# Patient Record
Sex: Male | Born: 1965
Health system: Southern US, Community
[De-identification: ages and names within clinical notes are randomized; demographics above are authoritative.]

## PROBLEM LIST (undated history)

## (undated) DIAGNOSIS — E119 Type 2 diabetes mellitus without complications: Secondary | ICD-10-CM

## (undated) DIAGNOSIS — Z720 Tobacco use: Secondary | ICD-10-CM

## (undated) DIAGNOSIS — F209 Schizophrenia, unspecified: Secondary | ICD-10-CM

## (undated) DIAGNOSIS — F1910 Other psychoactive substance abuse, uncomplicated: Secondary | ICD-10-CM

## (undated) HISTORY — PX: SKIN GRAFT: SHX250

---

## 2017-09-20 DIAGNOSIS — I10 Essential (primary) hypertension: Secondary | ICD-10-CM

## 2017-09-20 DIAGNOSIS — Z8639 Personal history of other endocrine, nutritional and metabolic disease: Secondary | ICD-10-CM

## 2017-09-20 DIAGNOSIS — F1911 Other psychoactive substance abuse, in remission: Secondary | ICD-10-CM | POA: Insufficient documentation

## 2017-09-20 HISTORY — DX: Personal history of other endocrine, nutritional and metabolic disease: Z86.39

## 2017-09-20 HISTORY — DX: Essential (primary) hypertension: I10

## 2017-10-05 DIAGNOSIS — R7989 Other specified abnormal findings of blood chemistry: Secondary | ICD-10-CM | POA: Insufficient documentation

## 2017-10-05 DIAGNOSIS — R768 Other specified abnormal immunological findings in serum: Secondary | ICD-10-CM | POA: Insufficient documentation

## 2017-10-05 HISTORY — DX: Other specified abnormal immunological findings in serum: R76.8

## 2017-10-11 DIAGNOSIS — K056 Periodontal disease, unspecified: Secondary | ICD-10-CM

## 2017-10-11 HISTORY — DX: Periodontal disease, unspecified: K05.6

## 2018-02-24 DIAGNOSIS — F1120 Opioid dependence, uncomplicated: Secondary | ICD-10-CM

## 2018-02-24 HISTORY — DX: Opioid dependence, uncomplicated: F11.20

## 2019-05-15 ENCOUNTER — Emergency Department (HOSPITAL_BASED_OUTPATIENT_CLINIC_OR_DEPARTMENT_OTHER)
Admission: EM | Admit: 2019-05-15 | Discharge: 2019-05-16 | Disposition: A | Discharge status: Home / Self Care | Payer: Self-pay | Attending: Emergency Medicine | Admitting: Emergency Medicine

## 2019-05-15 ENCOUNTER — Other Ambulatory Visit: Payer: Self-pay

## 2019-05-15 ENCOUNTER — Encounter (HOSPITAL_BASED_OUTPATIENT_CLINIC_OR_DEPARTMENT_OTHER): Payer: Self-pay

## 2019-05-15 ENCOUNTER — Emergency Department (HOSPITAL_BASED_OUTPATIENT_CLINIC_OR_DEPARTMENT_OTHER): Payer: Self-pay

## 2019-05-15 DIAGNOSIS — T148XXA Other injury of unspecified body region, initial encounter: Secondary | ICD-10-CM

## 2019-05-15 DIAGNOSIS — F1210 Cannabis abuse, uncomplicated: Secondary | ICD-10-CM | POA: Insufficient documentation

## 2019-05-15 DIAGNOSIS — Y929 Unspecified place or not applicable: Secondary | ICD-10-CM | POA: Insufficient documentation

## 2019-05-15 DIAGNOSIS — Y998 Other external cause status: Secondary | ICD-10-CM | POA: Insufficient documentation

## 2019-05-15 DIAGNOSIS — R410 Disorientation, unspecified: Secondary | ICD-10-CM | POA: Insufficient documentation

## 2019-05-15 DIAGNOSIS — F1510 Other stimulant abuse, uncomplicated: Secondary | ICD-10-CM | POA: Insufficient documentation

## 2019-05-15 DIAGNOSIS — R462 Strange and inexplicable behavior: Secondary | ICD-10-CM | POA: Insufficient documentation

## 2019-05-15 DIAGNOSIS — R5383 Other fatigue: Secondary | ICD-10-CM | POA: Insufficient documentation

## 2019-05-15 DIAGNOSIS — S90821A Blister (nonthermal), right foot, initial encounter: Secondary | ICD-10-CM | POA: Insufficient documentation

## 2019-05-15 DIAGNOSIS — E119 Type 2 diabetes mellitus without complications: Secondary | ICD-10-CM | POA: Insufficient documentation

## 2019-05-15 DIAGNOSIS — Y939 Activity, unspecified: Secondary | ICD-10-CM | POA: Insufficient documentation

## 2019-05-15 DIAGNOSIS — X58XXXA Exposure to other specified factors, initial encounter: Secondary | ICD-10-CM | POA: Insufficient documentation

## 2019-05-15 DIAGNOSIS — F17210 Nicotine dependence, cigarettes, uncomplicated: Secondary | ICD-10-CM | POA: Insufficient documentation

## 2019-05-15 DIAGNOSIS — F1910 Other psychoactive substance abuse, uncomplicated: Secondary | ICD-10-CM

## 2019-05-15 HISTORY — DX: Type 2 diabetes mellitus without complications: E11.9

## 2019-05-15 LAB — CBC WITH DIFFERENTIAL/PLATELET
Abs Immature Granulocytes: 0.05 10*3/uL (ref 0.00–0.07)
Basophils Absolute: 0.1 10*3/uL (ref 0.0–0.1)
Basophils Relative: 0 %
Eosinophils Absolute: 0.2 10*3/uL (ref 0.0–0.5)
Eosinophils Relative: 2 %
HCT: 41.5 % (ref 39.0–52.0)
Hemoglobin: 14.5 g/dL (ref 13.0–17.0)
Immature Granulocytes: 0 %
Lymphocytes Relative: 14 %
Lymphs Abs: 1.8 10*3/uL (ref 0.7–4.0)
MCH: 29.4 pg (ref 26.0–34.0)
MCHC: 34.9 g/dL (ref 30.0–36.0)
MCV: 84.2 fL (ref 80.0–100.0)
Monocytes Absolute: 0.7 10*3/uL (ref 0.1–1.0)
Monocytes Relative: 5 %
Neutro Abs: 10.3 10*3/uL — ABNORMAL HIGH (ref 1.7–7.7)
Neutrophils Relative %: 79 %
Platelets: 361 10*3/uL (ref 150–400)
RBC: 4.93 MIL/uL (ref 4.22–5.81)
RDW: 12.9 % (ref 11.5–15.5)
WBC: 13.2 10*3/uL — ABNORMAL HIGH (ref 4.0–10.5)
nRBC: 0 % (ref 0.0–0.2)

## 2019-05-15 LAB — URINALYSIS, ROUTINE W REFLEX MICROSCOPIC
Bilirubin Urine: NEGATIVE
Glucose, UA: NEGATIVE mg/dL
Hgb urine dipstick: NEGATIVE
Ketones, ur: NEGATIVE mg/dL
Leukocytes,Ua: NEGATIVE
Nitrite: NEGATIVE
Protein, ur: NEGATIVE mg/dL
Specific Gravity, Urine: >1.03 — ABNORMAL HIGH (ref 1.005–1.030)
pH: 6 (ref 5.0–8.0)

## 2019-05-15 LAB — COMPREHENSIVE METABOLIC PANEL
ALT: 27 U/L (ref 0–44)
AST: 24 U/L (ref 15–41)
Albumin: 4.5 g/dL (ref 3.5–5.0)
Alkaline Phosphatase: 124 U/L (ref 38–126)
Anion gap: 11 (ref 5–15)
BUN: 14 mg/dL (ref 6–20)
CO2: 27 mmol/L (ref 22–32)
Calcium: 9.4 mg/dL (ref 8.9–10.3)
Chloride: 96 mmol/L — ABNORMAL LOW (ref 98–111)
Creatinine, Ser: 0.82 mg/dL (ref 0.61–1.24)
GFR calc Af Amer: >60 mL/min (ref >60)
GFR calc non Af Amer: >60 mL/min (ref >60)
Glucose, Bld: 186 mg/dL — ABNORMAL HIGH (ref 70–99)
Potassium: 3.8 mmol/L (ref 3.5–5.1)
Sodium: 134 mmol/L — ABNORMAL LOW (ref 135–145)
Total Bilirubin: 0.6 mg/dL (ref 0.3–1.2)
Total Protein: 8.1 g/dL (ref 6.5–8.1)

## 2019-05-15 LAB — ETHANOL: Alcohol, Ethyl (B): <10 mg/dL (ref <10)

## 2019-05-15 LAB — RAPID URINE DRUG SCREEN, HOSP PERFORMED
Amphetamines: POSITIVE — AB
Barbiturates: NOT DETECTED
Benzodiazepines: NOT DETECTED
Cocaine: NOT DETECTED
Opiates: NOT DETECTED
Tetrahydrocannabinol: POSITIVE — AB

## 2019-05-15 LAB — CBG MONITORING, ED
Glucose-Capillary: 267 mg/dL — ABNORMAL HIGH (ref 70–99)
Glucose-Capillary: 190 mg/dL — ABNORMAL HIGH (ref 70–99)

## 2019-05-15 LAB — TSH: TSH: 1.057 u[IU]/mL (ref 0.350–4.500)

## 2019-05-15 LAB — AMMONIA: Ammonia: 28 umol/L (ref 9–35)

## 2019-05-15 LAB — ACETAMINOPHEN LEVEL: Acetaminophen (Tylenol), Serum: <10 ug/mL — ABNORMAL LOW (ref 10–30)

## 2019-05-15 LAB — SALICYLATE LEVEL: Salicylate Lvl: <7 mg/dL (ref 2.8–30.0)

## 2019-05-15 MED ORDER — BACITRACIN ZINC 500 UNIT/GM EX OINT
TOPICAL_OINTMENT | Freq: Two times a day (BID) | CUTANEOUS | Status: DC
  Administered 2019-05-15: 1 via TOPICAL
  Filled 2019-05-15: qty 28.35

## 2019-05-15 NOTE — ED Notes (Signed)
Attempted to call emergency contact in chart.  Number is not working.

## 2019-05-15 NOTE — ED Notes (Signed)
Discussed pt LOC with EDP; orders received.

## 2019-05-15 NOTE — ED Notes (Signed)
PT KEYS LABELED AND GIVEN TO SECURITY.

## 2019-05-15 NOTE — ED Notes (Addendum)
No change in LOC; pt arouses briefly to tactile stimuli; sitting up in bed.

## 2019-05-15 NOTE — ED Notes (Signed)
Pt appears to be intoxicated and falls asleep repeatedly mid-sentence while being assessed

## 2019-05-15 NOTE — Discharge Instructions (Addendum)
Substance Abuse Treatment Programs ° °Intensive Outpatient Programs °[facility]     °[address]      °[place], [place]                   °[phone]      ° °[facility] °[address] °[place], [place] °[phone] ° °[facility]     °(Inpatient and outpatient)     °[address]           °[phone]   ° °[facility] °[phone] (Suboxone and Methadone) ° °[address]      °[place], [place] [address]      °[phone]      ° °[address] °[place], [place] °[phone] ° °[facility] (Outpatient/Inpatient, Chemical)    °(insurance only) [phone]      °       °[facility] (Groups & Residential) °[place], [place] °[phone] ° °   °[facility]     °[address]     °[place], [place]      °[phone]      ° °[facility] (for caregivers and family) °[address] °[place], [place] °336-299-4655 ° ° ° ° ° °Residential Treatment Programs °[facility]      °[address], [place], [place] [address]  °[phone]      ° °[facility] °[address] [place], [place] [address] °[phone] ° °[facility]        °[phone]      ° °[facility] °[phone] ° °[facility]             °[address]                                         °[place], [place]                                                °[phone] or [phone]                              ° °[facility] [place] °[address] °[place], [address] °[phone] ° °[facility]    °[address]      °, [place]     °[phone]      ° °[facility] °[address] °, [place] °[phone] ° °[facility]   °[address]     °[place], [place] [address]     °[phone]      °Admissions: 8am-3pm M-F ° °[facility] °[address] °[place],  [place] °[phone] ° °[facility]: Residential Program (90 Days)   °[place], [place]      °[phone] or [phone]    ° °[facility] °[place], [place] °(Walk in Hours over the weekend or by referral) ° °[facility] °[address], [place], [place] [address] °([phone] ° °Crisis Mobile: [facility]:  1-877-626-1772 (for crisis response 24 hours a day) °[facility] Hotline:      1-800-256-2452 °Outpatient Psychiatry and Counseling ° °[facility]: Mobile Crisis   Management 24 hours:  1-579-867-5796  [facility] of the [employer] sliding scale fee and walk in schedule: M-F 8am-12pm/1pm-3pm [address]  [place], [place] [address] [place], [place] [address] [phone]  [facility] ([facility])- new patient walk-in appointments available Monday - Friday 8am -3pm.          [address] [place], [place] [address] [phone] or crisis line- [facility]/ Intensive Outpatient Therapy Program [place], [place] [address] [place]      [phone] [place], [place] [address]                 [place]   [facility] [phone]. [place], [place] [address]   [employer]          [address] Tanner [name]  [place], [place] [address]       [phone]  [facility] [address] [place], [place] [address] [phone]  [facility]    [address] [place], [place] [address]     [place], [place] Tanner [name]     [place] [address]     [phone]       [facility] [place] [address]  [facility]     [address], [place], [place], [place] [address] [place]     [address]     [place], [place] [address]     [phone]       [facility] [address] [place], [place] [address] [phone] Resources for Temporary Residential Assistance/Crisis [facility]) M-F 8am-3pm   [address] [place], [place] [address]   [phone] Services include: laundry, barbering, support groups, case management, phone  & computer access, showers, AA/NA mtgs, mental health/substance abuse nurse, job skills class, disability information, VA assistance, spiritual classes, etc.   [facility]   [address], [place]     [place]              [employer] (women and children)       [place]. [place], [place] [address] [phone] [email or web address] for application and process Application Required  [facility]   [address]    [place] [address]     [phone]                    [place],  [address] [phone](OILNZVJK application appt.) Application Required  [facility] (women only)    [address]     [place],  [address]     [phone]  Intake starts 6pm daily Need valid ID, SSC, & Police report [employer] [address] [place], [place] [phone] Application Required  [employer] (men only)     [place].      [place], [place]       [place] (Pregnant women only) [address] [place], [place]  [facility]      [place] Tanner [name].      [place], [place] [address]     [phone]             [facility] [address] [place], [place] 90 day commitment/SA/Application process  [facility](men only)     [address]     [place], [place]       Check-in at [facility] [address] [place], [place] [address] [phone] Men/Women/Women and Children must be there by 7 pm  [place], [employer]                Schedule an appointment as soon as possible with the wound clinic.  If you have any signs of infection such as increased swelling, drainage, redness then please return to the emergency department.

## 2019-05-15 NOTE — ED Notes (Signed)
Pt provided with TV dinner.

## 2019-05-15 NOTE — ED Notes (Signed)
Pt awake and talking to TTS

## 2019-05-15 NOTE — ED Notes (Signed)
Pt arousable to tactile stimuli (gentle shoulder nudge); opens eyes briefly, then falls back asleep.

## 2019-05-15 NOTE — ED Notes (Signed)
Pt moved to hallway bed so that he can be observed until he is more alert.

## 2019-05-15 NOTE — ED Notes (Signed)
Pt states "I'm just here for something for pain" to EMT when CBG done
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
## 2019-05-15 NOTE — ED Notes (Signed)
Pt placed in gown only; belongings bagged, labeled and placed at nurses' station.

## 2019-05-15 NOTE — ED Provider Notes (Addendum)
[facility] EMERGENCY DEPARTMENT Provider Note   CSN: [id number] Arrival date & time: 05/15/19  1046     History   Chief Complaint Chief Complaint  Patient presents with  . Blister    HPI Tanner [name] is a 53 y.o. male.     Patient with hx of diabetes presents for AMS. Patient was seen by myself initially for blister on the heel for months. Patient was alert and oriented at that time. Patient was coherent at that time. After discharge patient became lethargic, sleepy and confused. Patient denies drug abuse but has long history of meth and opiod abuse. Patient unable to contract for safety at this time. Bizarre affect with tangential thoughts. Will initiate AMS workup. If normal, TTS consult. Patient has had psych admissions in the past.      Past Medical History:  Diagnosis Date  . Diabetes mellitus without complication (HCC)     There are no active problems to display for this patient.   Past Surgical History:  Procedure Laterality Date  . SKIN GRAFT          Home Medications    Prior to Admission medications   Not on File    Family History No family history on file.  Social History Social History   Tobacco Use  . Smoking status: Current Every Day Smoker    Types: Cigarettes  . Smokeless tobacco: Never Used  Substance Use Topics  . Alcohol use: Never    Frequency: Never  . Drug use: Yes    Types: Methamphetamines     Allergies   Patient has no known allergies.   Review of Systems Review of Systems  Unable to perform ROS: Mental status change     Physical Exam Updated Vital Signs BP (!) 148/79   Pulse 63   Temp 98 F (36.7 C) (Oral)   Resp 16   Ht 6\' 1"  (1.854 m)   Wt 72.6 kg   SpO2 99%   BMI 21.11 kg/m   Physical Exam Vitals signs and nursing note reviewed.  Constitutional:      Appearance: Normal appearance.  HENT:     Head: Normocephalic.     Mouth/Throat:     Mouth: Mucous membranes are moist.  Eyes:   Conjunctiva/sclera: Conjunctivae normal.  Cardiovascular:     Rate and Rhythm: Normal rate and regular rhythm.  Pulmonary:     Effort: Pulmonary effort is normal.     Breath sounds: Normal breath sounds.  Musculoskeletal:     Right foot: No bunion.     Left foot: No bunion.       Feet:  Skin:    General: Skin is dry.  Neurological:     Mental Status: He is alert. He is disoriented.     Cranial Nerves: No cranial nerve deficit.     Comments: Tangential and bizarre thoughts  Psychiatric:        Mood and Affect: Mood normal.      ED Treatments / Results  Labs (all labs ordered are listed, but only abnormal results are displayed) Labs Reviewed  CBC WITH DIFFERENTIAL/PLATELET - Abnormal; Notable for the following components:      Result Value   WBC 13.2 (*)    Neutro Abs 10.3 (*)    All other components within normal limits  COMPREHENSIVE METABOLIC PANEL - Abnormal; Notable for the following components:   Sodium 134 (*)    Chloride 96 (*)    Glucose, Bld 186 (*)  All other components within normal limits  URINALYSIS, ROUTINE W REFLEX MICROSCOPIC - Abnormal; Notable for the following components:   Specific Gravity, Urine >1.030 (*)    All other components within normal limits  RAPID URINE DRUG SCREEN, HOSP PERFORMED - Abnormal; Notable for the following components:   Amphetamines POSITIVE (*)    Tetrahydrocannabinol POSITIVE (*)    All other components within normal limits  ACETAMINOPHEN LEVEL - Abnormal; Notable for the following components:   Acetaminophen (Tylenol), Serum <10 (*)    All other components within normal limits  CBG MONITORING, ED - Abnormal; Notable for the following components:   Glucose-Capillary 267 (*)    All other components within normal limits  CBG MONITORING, ED - Abnormal; Notable for the following components:   Glucose-Capillary 190 (*)    All other components within normal limits  AMMONIA  SALICYLATE LEVEL  ETHANOL  TSH  CBG MONITORING,  ED    EKG None  Radiology Ct Head Wo Contrast  Result Date: 05/15/2019 CLINICAL DATA:  53 year old diabetic with acute mental status change. EXAM: CT HEAD WITHOUT CONTRAST TECHNIQUE: Contiguous axial images were obtained from the base of the skull through the vertex without intravenous contrast. COMPARISON:  None. FINDINGS: Brain: Ventricular system normal in size and appearance for age. No mass lesion. No midline shift. No acute hemorrhage or hematoma. No extra-axial fluid collections. No evidence of acute infarction. No focal brain parenchymal abnormalities. Vascular: Minimal BILATERAL carotid siphon atherosclerosis. No hyperdense vessel. Skull: No skull fracture or other focal osseous abnormality involving the skull. Sinuses/Orbits: Visualized paranasal sinuses, bilateral mastoid air cells and bilateral middle ear cavities well-aerated. Visualized orbits and globes normal in appearance. Other: None. IMPRESSION: No acute intracranial abnormality. Electronically Signed   By: Evangeline Dakin M.D.   On: 05/15/2019 18:32    Procedures Procedures (including critical care time)  Medications Ordered in ED Medications  bacitracin ointment (1 application Topical Given 05/15/19 1414)     Initial Impression / Assessment and Plan / ED Course  I have reviewed the triage vital signs and the nursing notes.  Pertinent labs & imaging results that were available during my care of the patient were reviewed by me and considered in my medical decision making (see chart for details).  Clinical Course as of May 14 2140  Tue May 15, 2019  1406 Patient with mildly tender blister on the posterior right heel.  No signs of infection.  Will apply bacitracin and and wrapped the wound and have the patient follow-up with wound clinic.   [KM]  8413 Upon trying to discharge patient he appeared lethargic, sleeping and confused. Patient drove himself here but clearly is not in a state to drive himself. He cannot tell me  what hospital he is in. Will initial AMS workup. Patient has long hx of drug abuse but denies the same today   [KM]  2136 Patient more alert but with tangential thoughts and bizarre thinking. History of psychosis requiring [facility] admissions in the past. Medically cleared for TTS at this time. Patient care passed to Dr. Rex Kras due to change of shift at this time.   [KM]    Clinical Course User Index [KM] Alveria Apley, PA-C        Final Clinical Impressions(s) / ED Diagnoses   Final diagnoses:  Blister    ED Discharge Orders    None       Kristine Royal 05/15/19 2137    Alveria Apley, PA-C 05/15/19  2141    Little, Ambrose Finlandachel Morgan, MD 05/17/19 0000

## 2019-05-15 NOTE — ED Provider Notes (Addendum)
[facility] EMERGENCY DEPARTMENT Provider Note   CSN: [id number] Arrival date & time: 05/15/19  1046     History   Chief Complaint Chief Complaint  Patient presents with  . Blister    HPI Hutchinson Isenberg is a 53 y.o. male.     Patient is a 53 year old gentleman with uncontrolled diabetes presenting to the emergency department for a blister on the right heel.  Reports it has been there for several months.  Reports he does not have a primary care doctor. Blood sugar is elevated.  Denies any fever, chills, redness, purulent drainage.     Past Medical History:  Diagnosis Date  . Diabetes mellitus without complication ([place])     There are no active problems to display for this patient.   Past Surgical History:  Procedure Laterality Date  . SKIN GRAFT          Home Medications    Prior to Admission medications   Not on File    Family History No family history on file.  Social History Social History   Tobacco Use  . Smoking status: Current Every Day Smoker    Types: Cigarettes  . Smokeless tobacco: Never Used  Substance Use Topics  . Alcohol use: Never    Frequency: Never  . Drug use: Yes    Types: Methamphetamines     Allergies   Patient has no known allergies.   Review of Systems Review of Systems  Constitutional: Negative for chills and fever.  Respiratory: Negative for cough and shortness of breath.   Musculoskeletal: Negative for arthralgias and myalgias.  Skin: Positive for color change. Negative for pallor, rash and wound.  Neurological: Negative for dizziness and headaches.  All other systems reviewed and are negative.    Physical Exam Updated Vital Signs BP 126/80 (BP Location: Right Arm)   Pulse 68   Temp 98 F (36.7 C) (Oral)   Resp 18   Ht 6\' 1"  (1.854 m)   Wt 72.6 kg   SpO2 99%   BMI 21.11 kg/m   Physical Exam Vitals signs and nursing note reviewed.  Constitutional:      Appearance: Normal appearance.  HENT:      Head: Normocephalic.     Mouth/Throat:     Mouth: Mucous membranes are moist.  Eyes:     Conjunctiva/sclera: Conjunctivae normal.  Pulmonary:     Effort: Pulmonary effort is normal.  Musculoskeletal:       Feet:  Skin:    General: Skin is dry.  Neurological:     Mental Status: He is alert.  Psychiatric:        Mood and Affect: Mood normal.      ED Treatments / Results  Labs (all labs ordered are listed, but only abnormal results are displayed) Labs Reviewed  CBG MONITORING, ED - Abnormal; Notable for the following components:      Result Value   Glucose-Capillary 267 (*)    All other components within normal limits  CBG MONITORING, ED    EKG None  Radiology No results found.  Procedures Procedures (including critical care time)  Medications Ordered in ED Medications  bacitracin ointment (has no administration in time range)     Initial Impression / Assessment and Plan / ED Course  I have reviewed the triage vital signs and the nursing notes.  Pertinent labs & imaging results that were available during my care of the patient were reviewed by me and considered in my medical  decision making (see chart for details).  Clinical Course as of May 14 2140  Tue May 15, 2019  1406 Patient with mildly tender blister on the posterior right heel.  No signs of infection.  Will apply bacitracin and and wrapped the wound and have the patient follow-up with wound clinic.   [KM]  1751 Upon trying to discharge patient he appeared lethargic, sleeping and confused. Patient drove himself here but clearly is not in a state to drive himself. He cannot tell me what hospital he is in. Will initial AMS workup. Patient has long hx of drug abuse but denies the same today   [KM]  2136 Patient more alert but with tangential thoughts and bizarre thinking. History of psychosis requiring [facility] admissions in the past. Medically cleared for TTS at this time. Patient care passed to Dr. Clarene Duke  due to change of shift at this time.   [KM]    Clinical Course User Index [KM] Arlyn Dunning, PA-C         Clinical Impression: 1. Blister         Final Clinical Impressions(s) / ED Diagnoses   Final diagnoses:  Blister    ED Discharge Orders    None       Jeral Pinch 05/15/19 1407    Tegeler, Canary Brim, MD 05/15/19 1533    Arlyn Dunning, PA-C 05/15/19 2142    Tegeler, Canary Brim, MD 05/16/19 [phone]

## 2019-05-15 NOTE — ED Notes (Signed)
Pt is up for discharge; sts he drove himself here. Pt still appears intoxicated, is unable to keep eyes open for very long. He is more talkative now, but his speech is incomprehensible at times. Security officer at bedside and will discuss with [employer] officer. Pt willingly gave officer his keys.

## 2019-05-15 NOTE — ED Triage Notes (Addendum)
Pt c/o blister to right heel x 3-4 months-pt is noncompliant DM-to triage in w/c-NAD-pt with constant motion/fidgety in w/-denied recent drug use

## 2019-05-16 NOTE — ED Provider Notes (Signed)
Patient is feeling improved. No acute distress.  He does not appear psychotic. He was seen by psychiatry department and recommended discharged home.  Patient was comfortable with  plan   Ripley Fraise, MD 05/16/19 0010

## 2019-05-16 NOTE — ED Notes (Signed)
Pt is awake and alert. Pt ambulated around department with a limp and steady gait. Dr. Christy Gentles re-assessed pt and is comfortable discharging the pt.

## 2019-05-17 ENCOUNTER — Encounter (HOSPITAL_BASED_OUTPATIENT_CLINIC_OR_DEPARTMENT_OTHER): Payer: Self-pay

## 2019-05-17 ENCOUNTER — Emergency Department (HOSPITAL_BASED_OUTPATIENT_CLINIC_OR_DEPARTMENT_OTHER): Payer: Self-pay

## 2019-05-17 ENCOUNTER — Other Ambulatory Visit: Payer: Self-pay

## 2019-05-17 ENCOUNTER — Inpatient Hospital Stay (HOSPITAL_BASED_OUTPATIENT_CLINIC_OR_DEPARTMENT_OTHER)
Admission: EM | Admit: 2019-05-17 | Discharge: 2019-05-21 | DRG: 638 | Disposition: A | Discharge status: Home / Self Care | Payer: Self-pay | Attending: Internal Medicine | Admitting: Internal Medicine

## 2019-05-17 DIAGNOSIS — F1210 Cannabis abuse, uncomplicated: Secondary | ICD-10-CM | POA: Diagnosis present

## 2019-05-17 DIAGNOSIS — G9341 Metabolic encephalopathy: Secondary | ICD-10-CM

## 2019-05-17 DIAGNOSIS — E1165 Type 2 diabetes mellitus with hyperglycemia: Secondary | ICD-10-CM | POA: Diagnosis present

## 2019-05-17 DIAGNOSIS — K5900 Constipation, unspecified: Secondary | ICD-10-CM | POA: Diagnosis present

## 2019-05-17 DIAGNOSIS — L03115 Cellulitis of right lower limb: Secondary | ICD-10-CM

## 2019-05-17 DIAGNOSIS — E11628 Type 2 diabetes mellitus with other skin complications: Principal | ICD-10-CM | POA: Diagnosis present

## 2019-05-17 DIAGNOSIS — Z833 Family history of diabetes mellitus: Secondary | ICD-10-CM

## 2019-05-17 DIAGNOSIS — Z20828 Contact with and (suspected) exposure to other viral communicable diseases: Secondary | ICD-10-CM | POA: Diagnosis present

## 2019-05-17 DIAGNOSIS — Z91128 Patient's intentional underdosing of medication regimen for other reason: Secondary | ICD-10-CM

## 2019-05-17 DIAGNOSIS — F1910 Other psychoactive substance abuse, uncomplicated: Secondary | ICD-10-CM

## 2019-05-17 DIAGNOSIS — F17210 Nicotine dependence, cigarettes, uncomplicated: Secondary | ICD-10-CM | POA: Diagnosis present

## 2019-05-17 DIAGNOSIS — Z590 Homelessness: Secondary | ICD-10-CM

## 2019-05-17 DIAGNOSIS — T383X6A Underdosing of insulin and oral hypoglycemic [antidiabetic] drugs, initial encounter: Secondary | ICD-10-CM | POA: Diagnosis present

## 2019-05-17 DIAGNOSIS — Z720 Tobacco use: Secondary | ICD-10-CM | POA: Diagnosis present

## 2019-05-17 DIAGNOSIS — E119 Type 2 diabetes mellitus without complications: Secondary | ICD-10-CM

## 2019-05-17 DIAGNOSIS — F1510 Other stimulant abuse, uncomplicated: Secondary | ICD-10-CM | POA: Diagnosis present

## 2019-05-17 DIAGNOSIS — Z79899 Other long term (current) drug therapy: Secondary | ICD-10-CM

## 2019-05-17 DIAGNOSIS — B9562 Methicillin resistant Staphylococcus aureus infection as the cause of diseases classified elsewhere: Secondary | ICD-10-CM | POA: Diagnosis present

## 2019-05-17 HISTORY — DX: Cellulitis of right lower limb: L03.115

## 2019-05-17 HISTORY — DX: Other psychoactive substance abuse, uncomplicated: F19.10

## 2019-05-17 HISTORY — DX: Tobacco use: Z72.0

## 2019-05-17 LAB — COMPREHENSIVE METABOLIC PANEL
ALT: 19 U/L (ref 0–44)
AST: 17 U/L (ref 15–41)
Albumin: 3.6 g/dL (ref 3.5–5.0)
Alkaline Phosphatase: 100 U/L (ref 38–126)
Anion gap: 9 (ref 5–15)
BUN: 10 mg/dL (ref 6–20)
CO2: 25 mmol/L (ref 22–32)
Calcium: 8.8 mg/dL — ABNORMAL LOW (ref 8.9–10.3)
Chloride: 97 mmol/L — ABNORMAL LOW (ref 98–111)
Creatinine, Ser: 0.72 mg/dL (ref 0.61–1.24)
GFR calc Af Amer: >60 mL/min (ref >60)
GFR calc non Af Amer: >60 mL/min (ref >60)
Glucose, Bld: 288 mg/dL — ABNORMAL HIGH (ref 70–99)
Potassium: 4.2 mmol/L (ref 3.5–5.1)
Sodium: 131 mmol/L — ABNORMAL LOW (ref 135–145)
Total Bilirubin: 0.4 mg/dL (ref 0.3–1.2)
Total Protein: 7.1 g/dL (ref 6.5–8.1)

## 2019-05-17 LAB — CBC WITH DIFFERENTIAL/PLATELET
Abs Immature Granulocytes: 0.05 10*3/uL (ref 0.00–0.07)
Basophils Absolute: 0 10*3/uL (ref 0.0–0.1)
Basophils Relative: 0 %
Eosinophils Absolute: 0.1 10*3/uL (ref 0.0–0.5)
Eosinophils Relative: 1 %
HCT: 36.5 % — ABNORMAL LOW (ref 39.0–52.0)
Hemoglobin: 12.4 g/dL — ABNORMAL LOW (ref 13.0–17.0)
Immature Granulocytes: 1 %
Lymphocytes Relative: 10 %
Lymphs Abs: 1 10*3/uL (ref 0.7–4.0)
MCH: 29.1 pg (ref 26.0–34.0)
MCHC: 34 g/dL (ref 30.0–36.0)
MCV: 85.7 fL (ref 80.0–100.0)
Monocytes Absolute: 0.4 10*3/uL (ref 0.1–1.0)
Monocytes Relative: 4 %
Neutro Abs: 8.2 10*3/uL — ABNORMAL HIGH (ref 1.7–7.7)
Neutrophils Relative %: 84 %
Platelets: 295 10*3/uL (ref 150–400)
RBC: 4.26 MIL/uL (ref 4.22–5.81)
RDW: 12.8 % (ref 11.5–15.5)
WBC: 9.7 10*3/uL (ref 4.0–10.5)
nRBC: 0 % (ref 0.0–0.2)

## 2019-05-17 LAB — APTT: aPTT: 33 seconds (ref 24–36)

## 2019-05-17 LAB — C-REACTIVE PROTEIN: CRP: 12.1 mg/dL — ABNORMAL HIGH (ref <1.0)

## 2019-05-17 LAB — CBG MONITORING, ED: Glucose-Capillary: 210 mg/dL — ABNORMAL HIGH (ref 70–99)

## 2019-05-17 LAB — GLUCOSE, CAPILLARY: Glucose-Capillary: 188 mg/dL — ABNORMAL HIGH (ref 70–99)

## 2019-05-17 LAB — PROTIME-INR
INR: 1 (ref 0.8–1.2)
Prothrombin Time: 12.8 seconds (ref 11.4–15.2)

## 2019-05-17 LAB — SEDIMENTATION RATE: Sed Rate: 42 mm/hr — ABNORMAL HIGH (ref 0–16)

## 2019-05-17 LAB — SARS CORONAVIRUS 2 (TAT 6-24 HRS): SARS Coronavirus 2: NEGATIVE

## 2019-05-17 MED ORDER — VANCOMYCIN HCL 10 G IV SOLR
1500.0000 mg | Freq: Two times a day (BID) | INTRAVENOUS | Status: DC
  Administered 2019-05-17 – 2019-05-21 (×9): 1500 mg via INTRAVENOUS
  Filled 2019-05-17 (×10): qty 1500

## 2019-05-17 MED ORDER — ACETAMINOPHEN 650 MG RE SUPP: 650.0000 mg | Freq: Four times a day (QID) | RECTAL | Status: DC | PRN

## 2019-05-17 MED ORDER — NICOTINE 21 MG/24HR TD PT24
21.0000 mg | MEDICATED_PATCH | Freq: Every day | TRANSDERMAL | Status: DC
  Administered 2019-05-18 – 2019-05-21 (×4): 21 mg via TRANSDERMAL
  Filled 2019-05-17 (×5): qty 1

## 2019-05-17 MED ORDER — VANCOMYCIN HCL 500 MG IV SOLR
INTRAVENOUS | Status: AC
  Filled 2019-05-17: qty 500

## 2019-05-17 MED ORDER — HEPARIN SODIUM (PORCINE) 5000 UNIT/ML IJ SOLN
5000.0000 [IU] | Freq: Three times a day (TID) | INTRAMUSCULAR | Status: DC
  Administered 2019-05-17 – 2019-05-21 (×11): 5000 [IU] via SUBCUTANEOUS
  Filled 2019-05-17 (×11): qty 1

## 2019-05-17 MED ORDER — ONDANSETRON HCL 4 MG/2ML IJ SOLN: 4.0000 mg | Freq: Four times a day (QID) | INTRAMUSCULAR | Status: DC | PRN

## 2019-05-17 MED ORDER — INSULIN ASPART 100 UNIT/ML ~~LOC~~ SOLN
0.0000 [IU] | Freq: Three times a day (TID) | SUBCUTANEOUS | Status: DC
  Administered 2019-05-18 (×3): 2 [IU] via SUBCUTANEOUS
  Administered 2019-05-19 (×2): 5 [IU] via SUBCUTANEOUS
  Administered 2019-05-19: 12:00:00 2 [IU] via SUBCUTANEOUS
  Administered 2019-05-20 – 2019-05-21 (×4): 3 [IU] via SUBCUTANEOUS
  Administered 2019-05-21: 7 [IU] via SUBCUTANEOUS
  Administered 2019-05-21: 2 [IU] via SUBCUTANEOUS

## 2019-05-17 MED ORDER — INSULIN ASPART 100 UNIT/ML ~~LOC~~ SOLN
0.0000 [IU] | Freq: Every day | SUBCUTANEOUS | Status: DC
  Administered 2019-05-19 – 2019-05-20 (×2): 2 [IU] via SUBCUTANEOUS

## 2019-05-17 MED ORDER — SODIUM CHLORIDE 0.9 % IV SOLN
2.0000 g | Freq: Once | INTRAVENOUS | Status: AC
  Administered 2019-05-17: 13:00:00 2 g via INTRAVENOUS
  Filled 2019-05-17: qty 20

## 2019-05-17 MED ORDER — SODIUM CHLORIDE 0.9 % IV BOLUS
1000.0000 mL | Freq: Once | INTRAVENOUS | Status: AC
  Administered 2019-05-17: 21:00:00 1000 mL via INTRAVENOUS

## 2019-05-17 MED ORDER — VANCOMYCIN HCL IN DEXTROSE 1-5 GM/200ML-% IV SOLN
1000.0000 mg | Freq: Once | INTRAVENOUS | Status: DC
  Filled 2019-05-17: qty 200

## 2019-05-17 MED ORDER — VANCOMYCIN HCL 1000 MG IV SOLR
INTRAVENOUS | Status: AC
  Filled 2019-05-17: qty 1000

## 2019-05-17 MED ORDER — OXYCODONE-ACETAMINOPHEN 5-325 MG PO TABS
1.0000 | ORAL_TABLET | ORAL | Status: DC | PRN
  Administered 2019-05-17 – 2019-05-21 (×16): 1 via ORAL
  Filled 2019-05-17 (×16): qty 1

## 2019-05-17 MED ORDER — ACETAMINOPHEN 325 MG PO TABS
650.0000 mg | ORAL_TABLET | Freq: Four times a day (QID) | ORAL | Status: DC | PRN
  Filled 2019-05-17: qty 2

## 2019-05-17 MED ORDER — ONDANSETRON HCL 4 MG PO TABS: 4.0000 mg | ORAL_TABLET | Freq: Four times a day (QID) | ORAL | Status: DC | PRN

## 2019-05-17 MED ORDER — SENNA 8.6 MG PO TABS
1.0000 | ORAL_TABLET | Freq: Every day | ORAL | Status: DC | PRN
  Administered 2019-05-20: 09:00:00 8.6 mg via ORAL
  Filled 2019-05-17 (×2): qty 1

## 2019-05-17 NOTE — ED Notes (Signed)
Pt transferred to [facility] via [facility] 

## 2019-05-17 NOTE — Progress Notes (Signed)
Patient arrived to room 1518 per [facility] via stretcher. Patient alert and oriented x4. Request to elevate R foot on folded towels.

## 2019-05-17 NOTE — Progress Notes (Signed)
Pharmacy Antibiotic Note  Tanner [name] is a 53 y.o. male admitted on 05/17/2019 with cellulitis.  Pharmacy has been consulted for Vancomycin dosing. Hx of DM, presented to ED on 9/29 for blister on foot which was treated with bacitracin and wrapped. Has worsened at this time requiring ABX.   Height: 6\' 1"  (185.4 cm) Weight: 175 lb (79.4 kg) IBW/kg (Calculated) : 79.9  Temp (24hrs), Avg:97.6 F (36.4 C), Min:97.6 F (36.4 C), Max:97.6 F (36.4 C)  Recent Labs  Lab 05/15/19 1802 05/17/19 1224  WBC 13.2* 9.7  CREATININE 0.82  --     Estimated Creatinine Clearance: 115.7 mL/min (by C-G formula based on SCr of 0.82 mg/dL).    No Known Allergies  Antimicrobials this admission: 10/1 Ceftriaxone>>  10/1 Vancomycin >>   Dose adjustments this admission: N/a  Microbiology results: 10/1 BCx: Pending   Plan: - Will dose Vancomycin 1500mg  IV q12h  - Dosing based on AUC dosing with est AUC of 513. - Will continue to monitor patients renal fxn and dosing.   Thank you for allowing pharmacy to be a part of this patient's care.  Duanne Limerick 05/17/2019 12:45 PM

## 2019-05-17 NOTE — ED Provider Notes (Signed)
[facility] EMERGENCY DEPARTMENT Provider Note   CSN: [id number] Arrival date & time: 05/17/19  1044     History   Chief Complaint No chief complaint on file.   HPI Tanner [name] is a 53 y.o. male with a past medical history significant for diabetes mellitus but stopped taking Metformin 6 weeks ago who presents to the ED on 10/1 due to increased right heel pain. Patient was seen in the ED 2 days ago in which bacitracin was placed on blister and wrapped up. Upon discharge of patient, he was found to be extremely lethargic and confused and was worked up for AMS. [facility] evaluated patient and he did not meet criteria for admission. Patient states a callus has been present on his right heel for some time now, but turned into a blister a few days ago. Patient admits blister has progressively worsened over the past few days associated with severe pain, redness, and swelling. He has tried Tylenol with no relief. Patient states pain is worse with ambulation.Patient admits to chills, but denies fever, chest pain, and shortness of breath.  Past Medical History:  Diagnosis Date  . Diabetes mellitus without complication [facility])     Patient Active Problem List   Diagnosis Date Noted  . Cellulitis of right foot 05/17/2019    Past Surgical History:  Procedure Laterality Date  . SKIN GRAFT          Home Medications    Prior to Admission medications   Not on File    Family History History reviewed. No pertinent family history.  Social History Social History   Tobacco Use  . Smoking status: Current Every Day Smoker    Types: Cigarettes  . Smokeless tobacco: Never Used  Substance Use Topics  . Alcohol use: Never    Frequency: Never  . Drug use: Yes    Types: Methamphetamines     Allergies   Patient has no known allergies.   Review of Systems Review of Systems  Constitutional: Positive for chills. Negative for fever.  Respiratory: Negative for shortness of breath.    Cardiovascular: Positive for leg swelling (right foot/calf swelling). Negative for chest pain.  Musculoskeletal: Positive for gait problem (difficulties ambulating due to pain).  Skin: Positive for color change and wound.  All other systems reviewed and are negative.    Physical Exam Updated Vital Signs BP 138/76 (BP Location: Left Arm)   Pulse 69   Temp 97.9 F (36.6 C) (Oral)   Resp 16   Ht 6\' 1"  (1.854 m)   Wt 79.4 kg   SpO2 100%   BMI 23.09 kg/m   Physical Exam Constitutional:      General: He is not in acute distress.    Appearance: He is not ill-appearing.  HENT:     Head: Normocephalic.  Eyes:     Pupils: Pupils are equal, round, and reactive to light.  Neck:     Musculoskeletal: Neck supple.  Cardiovascular:     Rate and Rhythm: Normal rate and regular rhythm.     Pulses: Normal pulses.     Heart sounds: Normal heart sounds. No murmur. No friction rub. No gallop.   Pulmonary:     Effort: Pulmonary effort is normal.     Breath sounds: Normal breath sounds.  Abdominal:     General: Abdomen is flat. There is no distension.     Palpations: Abdomen is soft.  Musculoskeletal: Normal range of motion.  Skin:    General: Skin  is warm and dry.     Findings: Erythema and lesion present.     Comments: Large hemorraghic bullous located on lateral aspect of right heel with surrounding erythema that goes up to calf and edema.   Neurological:     General: No focal deficit present.     Mental Status: He is alert.        ED Treatments / Results  Labs (all labs ordered are listed, but only abnormal results are displayed) Labs Reviewed  COMPREHENSIVE METABOLIC PANEL - Abnormal; Notable for the following components:      Result Value   Sodium 131 (*)    Chloride 97 (*)    Glucose, Bld 288 (*)    Calcium 8.8 (*)    All other components within normal limits  CBC WITH DIFFERENTIAL/PLATELET - Abnormal; Notable for the following components:   Hemoglobin 12.4 (*)     HCT 36.5 (*)    Neutro Abs 8.2 (*)    All other components within normal limits  CULTURE, BLOOD (ROUTINE X 2)  CULTURE, BLOOD (ROUTINE X 2)  SARS CORONAVIRUS 2 (TAT 6-24 HRS)  APTT  PROTIME-INR    EKG None  Radiology Ct Head Wo Contrast  Result Date: 05/15/2019 CLINICAL DATA:  53 year old diabetic with acute mental status change. EXAM: CT HEAD WITHOUT CONTRAST TECHNIQUE: Contiguous axial images were obtained from the base of the skull through the vertex without intravenous contrast. COMPARISON:  None. FINDINGS: Brain: Ventricular system normal in size and appearance for age. No mass lesion. No midline shift. No acute hemorrhage or hematoma. No extra-axial fluid collections. No evidence of acute infarction. No focal brain parenchymal abnormalities. Vascular: Minimal BILATERAL carotid siphon atherosclerosis. No hyperdense vessel. Skull: No skull fracture or other focal osseous abnormality involving the skull. Sinuses/Orbits: Visualized paranasal sinuses, bilateral mastoid air cells and bilateral middle ear cavities well-aerated. Visualized orbits and globes normal in appearance. Other: None. IMPRESSION: No acute intracranial abnormality. Electronically Signed   By: Hulan Saashomas  Lawrence M.D.   On: 05/15/2019 18:32   Dg Foot Complete Right  Result Date: 05/17/2019 CLINICAL DATA:  Blister lateral right heel. EXAM: RIGHT FOOT COMPLETE - 3+ VIEW COMPARISON:  None. FINDINGS: No acute bony abnormality. Specifically, no fracture, subluxation, or dislocation. No bone destruction to suggest osteomyelitis. No soft tissue foreign body. IMPRESSION: Negative. Electronically Signed   By: Charlett NoseKevin  Dover M.D.   On: 05/17/2019 12:09    Procedures Procedures (including critical care time)  Medications Ordered in ED Medications  vancomycin (VANCOCIN) 1,500 mg in sodium chloride 0.9 % 500 mL IVPB (0 mg Intravenous Stopped 05/17/19 1548)  vancomycin (VANCOCIN) 1000 MG powder (  Not Given 05/17/19 1357)  vancomycin  (VANCOCIN) 500 MG powder (  Not Given 05/17/19 1358)  cefTRIAXone (ROCEPHIN) 2 g in sodium chloride 0.9 % 100 mL IVPB (0 g Intravenous Stopped 05/17/19 1319)     Initial Impression / Assessment and Plan / ED Course  I have reviewed the triage vital signs and the nursing notes.  Pertinent labs & imaging results that were available during my care of the patient were reviewed by me and considered in my medical decision making (see chart for details).  Corey SkainsVincent Bellows is a 53 year old male who presents to the ED with blister on right heel that has progressively gotten worse. He was seen in the ED 2 days ago for same complaint. On physical exam, blister has enlarged from last ED visit and appears to have some sort of collection  of blood vs. Pus vs. Debri. Blister has surrounding erythema which spreads all the way up to mid calf. Patient is afebrile with normal heart rate and respirations. Patient is in no acute distress. Patient has no signs of sepsis, but has significant cellulitis. Will obtain x-ray of right foot, routine labs, and blood cultures.  Foot x-ray was negative for bony fractures. Given patient is noncompliant with diabetic medication and infection has significantly gotten worse over the past few days, patient will be admitted. Consult to hospitalist. Dr. Laverta Baltimore spoke to hospitalist and patient will be admitted for [facility] at [facility].   Final Clinical Impressions(s) / ED Diagnoses   Final diagnoses:  Cellulitis of right lower extremity    ED Discharge Orders    None       Romie Levee 05/17/19 1613    Margette Fast, MD 05/17/19 2028

## 2019-05-17 NOTE — Care Plan (Signed)
Transfer from [facility] Dr. Laverta Baltimore Dx: right foot cellulitis  49yom PMH DM not meds seen at [facility] for second time in 2 days for right heal blister; now erythematous up to mid-tibia, edema and large bulla. Given significant worsening in 48 hours and DM, admission requested  No COVID symptoms  PMH DM  Afebrile, VSS, no hypoxia Non-toxic   A/P Right DM foot infection Accepted to [facility] medical bed.  Murray Hodgkins, MD [facility] [phone]

## 2019-05-17 NOTE — H&P (Addendum)
History and Physical    [name] NVB:[id number] DOB: 03/22/1966 DOA: 05/17/2019  Referring MD/NP/PA:   PCP: Patient, No Pcp Per   Patient coming from:  The patient is coming from home.  At baseline, pt is independent for most of ADL.        Chief Complaint: right foot pain  HPI: Tanner [name] is a 53 y.o. male with medical history significant of diabetes mellitus, drug abuse, tobacco abuse, who presents with right foot pain.  Patient states that he has right foot pain for more than 3 days.  Initially he noticed a blister in right heel, which has been enlarged to become a big bulla. His right foot is swelling, tender and erythematous.  The pain is constant, sharp, 9 out of 10 severity, nonradiating.  He does not have fever, but has chills.  Patient does not have chest pain, shortness of breath, cough.  No nausea, vomiting, diarrhea or abdominal pain.  No symptoms of UTI.  Patient has mild constipation.  Patient was seen in ED on 9/29.  Per ED physician's note, that time, patient was noted to be lethargic and confused. [facility] evaluated patient and they did not meet criteria for admission. Pt did not have any suicidal or homicidal ideations.  CT of head is negative for acute intracranial abnormalities.  UDS was positive for amphetamines and THC. Per EDP's note, his mental status improved.  He was discharged home at stable condition. Pt comes back to ED due to continuation of her right foot pain which has worsened per patient.  Currently patient is alert, orientated x4.  No confusion, not lethargic.  No unilateral weakness or numbness in extremities.   ED Course: pt was found to have WBC 9.7, INR 1.0, PTT 33, urinalysis negative, pending COVID-19 test, renal function normal, temperature normal, blood pressure 139/79, heart rate 61, oxygen saturation 99% on room air.  Right foot x-ray is negative for bony abnormalities.  Patient is admitted to [place] bed as inpatient.  Review of Systems:    General: no fevers, has chills,  has fatigue HEENT: no blurry vision, hearing changes or sore throat Respiratory: no dyspnea, coughing, wheezing CV: no chest pain, no palpitations GI: no nausea, vomiting, abdominal pain, diarrhea, constipation GU: no dysuria, burning on urination, increased urinary frequency, hematuria  Ext: no leg edema Neuro: no unilateral weakness, numbness, or tingling, no vision change or hearing loss Skin: no rash. Has right foot pain, swelling and redness and a large bulla in right heel. MSK: No muscle spasm, no deformity, no limitation of range of movement in spin Heme: No easy bruising.  Travel history: No recent long distant travel.  Allergy: No Known Allergies  Past Medical History:  Diagnosis Date  . Diabetes mellitus without complication ([place])   . Drug abuse ([place])   . Tobacco abuse     Past Surgical History:  Procedure Laterality Date  . SKIN GRAFT      Social History:  reports that he has been smoking cigarettes. He has never used smokeless tobacco. He reports current drug use. Drug: Methamphetamines. He reports that he does not drink alcohol.  Family History:  Family History  Problem Relation Age of Onset  . Dementia Father   . Diabetes Mellitus II Brother      Prior to Admission medications   Medication Sig Start Date End Date Taking? Authorizing Provider  acetaminophen (TYLENOL) 500 MG tablet Take 1,500 mg by mouth every 4 (four) hours as needed for moderate pain (  foot pain).   Yes [provider]  metFORMIN (GLUCOPHAGE) 1000 MG tablet Take 1,000 mg by mouth 2 (two) times daily. 03/18/19  Yes [provider]  tamsulosin (FLOMAX) 0.4 MG CAPS capsule Take 0.4 mg by mouth daily. 03/18/19  Yes [provider]    Physical Exam: Vitals:   05/17/19 1320 05/17/19 1510 05/17/19 1736 05/17/19 1903  BP: (!) 145/78 138/76 (!) 142/75 (!) 142/86  Pulse: 65 69 70 70  Resp: _0 Temp:  97.9 F (36.6 C) 97.9 F (36.6  C) 98.3 F (36.8 C)  TempSrc:  Oral Oral Oral  SpO2: 100% 100% 100% 99%  Weight:      Height:       General: Not in acute distress HEENT:       Eyes: PERRL, EOMI, no scleral icterus.       ENT: No discharge from the ears and nose, no pharynx injection, no tonsillar enlargement.        Neck: No JVD, no bruit, no mass felt. Heme: No neck lymph node enlargement. Cardiac: S1/S2, RRR, No murmurs, No gallops or rubs. Respiratory: No rales, wheezing, rhonchi or rubs. GI: Soft, nondistended, nontender, no rebound pain, no organomegaly, BS present. GU: No hematuria Ext: No pitting leg edema bilaterally. 2+DP/PT pulse bilaterally. Musculoskeletal: No joint deformities, No joint redness or warmth, no limitation of ROM in spin. Skin: Has right foot tenderness, warmth, swelling and redness. Erythema goes up to lower leg. Has a large bulla in right heel. Please see picture in EDP' note Neuro: Alert, oriented X3, cranial nerves II-XII grossly intact, moves all extremities normally.  Psych: Patient is not psychotic, no suicidal or hemocidal ideation.  Labs on Admission: I have personally reviewed following labs and imaging studies  CBC: Recent Labs  Lab 05/15/19 1802 05/17/19 1224  WBC 13.2* 9.7  NEUTROABS 10.3* 8.2*  HGB 14.5 12.4*  HCT 41.5 36.5*  MCV 84.2 85.7  PLT 361 101   Basic Metabolic Panel: Recent Labs  Lab 05/15/19 1802 05/17/19 1224  NA 134* 131*  K 3.8 4.2  CL 96* 97*  CO2 27 25  GLUCOSE 186* 288*  BUN 14 10  CREATININE 0.82 0.72  CALCIUM 9.4 8.8*   GFR: Estimated Creatinine Clearance: 118.5 mL/min (by C-G formula based on SCr of 0.72 mg/dL). Liver Function Tests: Recent Labs  Lab 05/15/19 1802 05/17/19 1224  AST 24 17  ALT 27 19  ALKPHOS 124 100  BILITOT 0.6 0.4  PROT 8.1 7.1  ALBUMIN 4.5 3.6   No results for input(s): LIPASE, AMYLASE in the last 168 hours. Recent Labs  Lab 05/15/19 1802  AMMONIA 28   Coagulation Profile: Recent Labs  Lab  05/17/19 1224  INR 1.0   Cardiac Enzymes: No results for input(s): CKTOTAL, CKMB, CKMBINDEX, TROPONINI in the last 168 hours. BNP (last 3 results) No results for input(s): PROBNP in the last 8760 hours. HbA1C: No results for input(s): HGBA1C in the last 72 hours. CBG: Recent Labs  Lab 05/15/19 1155 05/15/19 1817 05/17/19 1840  GLUCAP 267* 190* 210*   Lipid Profile: No results for input(s): CHOL, HDL, LDLCALC, TRIG, CHOLHDL, LDLDIRECT in the last 72 hours. Thyroid Function Tests: Recent Labs    05/15/19 1802  TSH 1.057   Anemia Panel: No results for input(s): VITAMINB12, FOLATE, FERRITIN, TIBC, IRON, RETICCTPCT in the last 72 hours. Urine analysis:    Component Value Date/Time   COLORURINE YELLOW 05/15/2019 1802   APPEARANCEUR CLEAR 05/15/2019  [place] >1.030 (H) 05/15/2019 1802   PHURINE 6.0 05/15/2019 1802   GLUCOSEU NEGATIVE 05/15/2019 1802   HGBUR NEGATIVE 05/15/2019 1802   BILIRUBINUR NEGATIVE 05/15/2019 1802   KETONESUR NEGATIVE 05/15/2019 1802   PROTEINUR NEGATIVE 05/15/2019 1802   NITRITE NEGATIVE 05/15/2019 1802   LEUKOCYTESUR NEGATIVE 05/15/2019 1802   Sepsis Labs: _0 (procalcitonin:4,lacticidven:4) )No results found for this or any previous visit (from the past 240 hour(s)).   Radiological Exams on Admission: Dg Foot Complete Right  Result Date: 05/17/2019 CLINICAL DATA:  Blister lateral right heel. EXAM: RIGHT FOOT COMPLETE - 3+ VIEW COMPARISON:  None. FINDINGS: No acute bony abnormality. Specifically, no fracture, subluxation, or dislocation. No bone destruction to suggest osteomyelitis. No soft tissue foreign body. IMPRESSION: Negative. Electronically Signed   By: Rolm Baptise M.D.   On: 05/17/2019 12:09     EKG: Not done in ED, will get one.   Assessment/Plan Principal Problem:   Cellulitis of right foot Active Problems:   Diabetes mellitus without complication (HCC)   Tobacco abuse   Drug abuse ([place])   Cellulitis of right  foot: No fever or leukocytosis.  Clinically not septic.  Hemodynamically stable. - will admit to med-surg bed as inpt - Empiric antimicrobial treatment with vancomycin (pt received one dose of Rocephin in ED). - PRN Zofran for nausea, and Percocet for pain - Blood cultures x 2  - ESR and CRP - wound care consult - IVF: 1. L of NS bolus  Diabetes mellitus without complication ([place]): No F7P available in record.  Blood sugar 288.  Patient was on metformin, but did not take it in past several months -SSI -check A1c  Tobacco abuse and Drug abuse ([place]): UDS was positive for THC and amphetamine on 9/29 -Did counseling about importance of quitting substance use -Nicotine patch  Inpatient status:  # Patient requires inpatient status due to high intensity of service, high risk for further deterioration and high frequency of surveillance required.  I certify that at the point of admission it is my clinical judgment that the patient will require inpatient hospital care spanning beyond 2 midnights from the point of admission.  . This patient has multiple chronic comorbidities including diabetes mellitus, drug abuse, tobacco abuse . Now patient has presenting symptoms include right foot cellulitis with a large heel bulla . The worrisome physical exam findings include foot tenderness, erythema, swelling or warmth, spreading up to lower leg.  Has large heel Bulla. . The initial radiographic and laboratory data are worrisome because of positive UDS for amphetamine and THC  . Current medical needs: please see my assessment and plan . Predictability of an adverse outcome (risk): Patient has multiple comorbidities including diabetes mellitus, drug abuse and tobacco abuse, now presents with right foot cellulitis, with a large heel bulla. Though patient is not septic currently, given his history of diabetes which seems to be uncontrolled with blood sugar 288 and medication noncompliance, and also with a large  bullae in right foot heel, patient is at high risk for deteriorating.  Will need to be treated in hospital for at least 2 days.    DVT ppx: SQ Heparin     Code Status: Full code Family Communication: None at bed side.      Disposition Plan:  Anticipate discharge back to previous home environment Consults called:  none Admission status:   medical floor/inpt   Date of Service 05/17/2019    Ivor Costa [facility]   If 7PM-7AM, please contact  night-coverage [email or web address] Password [email or web address] 05/17/2019, 8:51 PM

## 2019-05-17 NOTE — ED Notes (Signed)
Pt resting quietly with eyes closed.  Pt reports 8/10 pain scale.

## 2019-05-17 NOTE — ED Notes (Signed)
Report called to receiving unit, Spoke to [employer], awaiting ETA from [employer] 

## 2019-05-18 DIAGNOSIS — L03115 Cellulitis of right lower limb: Secondary | ICD-10-CM

## 2019-05-18 LAB — CBC
HCT: 35.1 % — ABNORMAL LOW (ref 39.0–52.0)
Hemoglobin: 12 g/dL — ABNORMAL LOW (ref 13.0–17.0)
MCH: 29.4 pg (ref 26.0–34.0)
MCHC: 34.2 g/dL (ref 30.0–36.0)
MCV: 86 fL (ref 80.0–100.0)
Platelets: 266 10*3/uL (ref 150–400)
RBC: 4.08 MIL/uL — ABNORMAL LOW (ref 4.22–5.81)
RDW: 12.7 % (ref 11.5–15.5)
WBC: 7.4 10*3/uL (ref 4.0–10.5)
nRBC: 0 % (ref 0.0–0.2)

## 2019-05-18 LAB — BASIC METABOLIC PANEL
Anion gap: 7 (ref 5–15)
BUN: 6 mg/dL (ref 6–20)
CO2: 26 mmol/L (ref 22–32)
Calcium: 8.8 mg/dL — ABNORMAL LOW (ref 8.9–10.3)
Chloride: 104 mmol/L (ref 98–111)
Creatinine, Ser: 0.81 mg/dL (ref 0.61–1.24)
GFR calc Af Amer: >60 mL/min (ref >60)
GFR calc non Af Amer: >60 mL/min (ref >60)
Glucose, Bld: 162 mg/dL — ABNORMAL HIGH (ref 70–99)
Potassium: 3.5 mmol/L (ref 3.5–5.1)
Sodium: 137 mmol/L (ref 135–145)

## 2019-05-18 LAB — GLUCOSE, CAPILLARY
Glucose-Capillary: 180 mg/dL — ABNORMAL HIGH (ref 70–99)
Glucose-Capillary: 170 mg/dL — ABNORMAL HIGH (ref 70–99)
Glucose-Capillary: 160 mg/dL — ABNORMAL HIGH (ref 70–99)
Glucose-Capillary: 185 mg/dL — ABNORMAL HIGH (ref 70–99)

## 2019-05-18 LAB — HIV ANTIBODY (ROUTINE TESTING W REFLEX): HIV Screen 4th Generation wRfx: NONREACTIVE

## 2019-05-18 LAB — HEMOGLOBIN A1C
Hgb A1c MFr Bld: 8.1 % — ABNORMAL HIGH (ref 4.8–5.6)
Mean Plasma Glucose: 185.77 mg/dL

## 2019-05-18 NOTE — Consult Note (Signed)
[place] Nurse wound consult note Reason for Consult: Right lateral heel blood-filled bullae in patient with comorbid conditions including diabetes. See photograph provided by the ED provider yesterday. Wound type:infectious Pressure Injury POA: N/A Measurement: per Nursing FlowSheet, 3cm x 3cm Wound bed:N/A Drainage (amount, consistency, odor) N/A Periwound:erythema, edema Dressing procedure/placement/frequency:I have provided Nursing with a conservative POC to include twice daily cleansing with NS and covering the lesion with an antimicrobial, nonadherent (Xeroform gauze). The bullae may rupture spontaneously, but it is not likely to reabsorb as it is not serum-filled. If you agree, consider Orthopedic consultation for debridement/I&D after patient has been on antibiotics.  [place] nursing team will not follow, but will remain available to this patient, the nursing and medical teams.  Please re-consult if needed. Thanks, Maudie Flakes, MSN, RN, [place], Arther Abbott  Pager# [phone]

## 2019-05-18 NOTE — Plan of Care (Signed)
  Problem: Nutrition: Goal: Adequate nutrition will be maintained Outcome: Progressing   Problem: Pain Managment: Goal: General experience of comfort will improve Outcome: Progressing   Problem: Safety: Goal: Ability to remain free from injury will improve Outcome: Progressing   

## 2019-05-18 NOTE — Progress Notes (Signed)
PROGRESS NOTE    Tanner [name]  HYQ:[id number] DOB: Jan 20, 1966 DOA: 05/17/2019 PCP: Patient, No Pcp Per    Brief Narrative:  53 year old gentleman with history of diabetes, currently not using medication, smoker who presented to the hospital with right foot pain.  Patient initially noticed a blister on the right heel, remembers slipping off and falling on steps few days ago with severe pain on that side.  Patient was seen in the emergency room on 9/29.  UDS was positive for amphetamines and THC.  Was discharged home.  Came back to the ER in 2 days with continuous pain on the right heel with bulla formation.  Patient had normal white cell count, COVID-19 negative.  Afebrile.  Was found to have spreading cellulitis of the right foot.   Assessment & Plan:   Principal Problem:   Cellulitis of right foot Active Problems:   Diabetes mellitus without complication (HCC)   Tobacco abuse   Drug abuse ([place])  Cellulitis of the right foot/diabetic foot infection: Probably started with trauma.  No penetrating injury.  Has been big bullae formation. Blood cultures were done.  Started on vancomycin. Bedside aspiration done, Gram stain shows gram-positive cocci. No evidence of deep infection, however patient has significant soft tissue spreading inflammation. We will continue vancomycin.  Continue local dressing.  Type 2 diabetes: A1c 8.  Patient not on treatment.  Used to be on metformin.  Currently on sliding scale insulin.  Will prescribe metformin on discharge.  Smoker: Counseled to quit.  On nicotine patch.  Procedure: Patient was explained about the procedure, consented, right lateral foot bulla area was sterilized with Betadine, aspirated with 23 needle, thin serosanguineous freely flowing fluid drained, no frank abscess, sample sent to the lab for Gram stain and culture.  Dry dressing applied.   DVT prophylaxis: Heparin subcu Code Status: Full code Family Communication: None Disposition  Plan: Home.  Anticipate in the next 24 to 48 hours   Consultants:   None  Procedures:   None  Antimicrobials:   Vancomycin, 05/17/2019----   Subjective: Patient seen and examined.  No overnight events.  Still has pain right lateral foot.  Afebrile.  Patient thinks it is started after he hit his foot on the side rail of stairs.  Objective: Vitals:   05/17/19 1736 05/17/19 1903 05/18/19 1154 05/18/19 1255  BP: (!) 142/75 (!) 142/86 124/64 135/82  Pulse: 70 70 62 69  Resp: 16 16 16 16   Temp: 97.9 F (36.6 C) 98.3 F (36.8 C) 98.4 F (36.9 C) 98 F (36.7 C)  TempSrc: Oral Oral Oral Oral  SpO2: 100% 99% 98% 99%  Weight:      Height:        Intake/Output Summary (Last 24 hours) at 05/18/2019 1443 Last data filed at 05/18/2019 0300 Gross per 24 hour  Intake 1980 ml  Output 850 ml  Net 1130 ml   Filed Weights   05/17/19 1109  Weight: 79.4 kg    Examination:  General exam: Appears calm and comfortable  Respiratory system: Clear to auscultation. Respiratory effort normal. Cardiovascular system: S1 & S2 heard, RRR. No JVD, murmurs, rubs, gallops or clicks. No pedal edema. Gastrointestinal system: Abdomen is nondistended, soft and nontender. No organomegaly or masses felt. Normal bowel sounds heard. Central nervous system: Alert and oriented. No focal neurological deficits. Extremities: Symmetric 5 x 5 power. Skin: No rashes, lesions or ulcers Psychiatry: Judgement and insight appear normal. Mood & affect appropriate.  Swelling and erythema right foot,  dorsum of the foot extending to above the ankle, no fluctuation. Big fluid-filled bullae, aspirated and sent to culture, collapsed after aspiration.    Data Reviewed: I have personally reviewed following labs and imaging studies  CBC: Recent Labs  Lab 05/15/19 1802 05/17/19 1224 05/18/19 0545  WBC 13.2* 9.7 7.4  NEUTROABS 10.3* 8.2*  --   HGB 14.5 12.4* 12.0*  HCT 41.5 36.5* 35.1*  MCV 84.2 85.7 86.0  PLT  361 295 266   Basic Metabolic Panel: Recent Labs  Lab 05/15/19 1802 05/17/19 1224 05/18/19 0545  NA 134* 131* 137  K 3.8 4.2 3.5  CL 96* 97* 104  CO2 27 25 26   GLUCOSE 186* 288* 162*  BUN 14 10 6   CREATININE 0.82 0.72 0.81  CALCIUM 9.4 8.8* 8.8*   GFR: Estimated Creatinine Clearance: 117.1 mL/min (by C-G formula based on SCr of 0.81 mg/dL). Liver Function Tests: Recent Labs  Lab 05/15/19 1802 05/17/19 1224  AST 24 17  ALT 27 19  ALKPHOS 124 100  BILITOT 0.6 0.4  PROT 8.1 7.1  ALBUMIN 4.5 3.6   No results for input(s): LIPASE, AMYLASE in the last 168 hours. Recent Labs  Lab 05/15/19 1802  AMMONIA 28   Coagulation Profile: Recent Labs  Lab 05/17/19 1224  INR 1.0   Cardiac Enzymes: No results for input(s): CKTOTAL, CKMB, CKMBINDEX, TROPONINI in the last 168 hours. BNP (last 3 results) No results for input(s): PROBNP in the last 8760 hours. HbA1C: Recent Labs    05/18/19 0545  HGBA1C 8.1*   CBG: Recent Labs  Lab 05/15/19 1817 05/17/19 1840 05/17/19 2047 05/18/19 0803 05/18/19 1139  GLUCAP 190* 210* 188* 180* 170*   Lipid Profile: No results for input(s): CHOL, HDL, LDLCALC, TRIG, CHOLHDL, LDLDIRECT in the last 72 hours. Thyroid Function Tests: Recent Labs    05/15/19 1802  TSH 1.057   Anemia Panel: No results for input(s): VITAMINB12, FOLATE, FERRITIN, TIBC, IRON, RETICCTPCT in the last 72 hours. Sepsis Labs: No results for input(s): PROCALCITON, LATICACIDVEN in the last 168 hours.  Recent Results (from the past 240 hour(s))  Blood Culture (routine x 2)     Status: None (Preliminary result)   Collection Time: 05/17/19 12:24 PM   Specimen: BLOOD  Result Value Ref Range Status   Specimen Description   Final    BLOOD BLOOD LEFT FOREARM Performed at [facility], [address] [place], [address] [place]    Special Requests   Final    BOTTLES DRAWN AEROBIC AND ANAEROBIC Blood Culture adequate volume Performed at [facility], [address] [place], [address] [place]    Culture   Final    NO GROWTH < 24 HOURS Performed at [facility], [address], [place], [address] [place]    Report Status PENDING  Incomplete  Blood Culture (routine x 2)     Status: None (Preliminary result)   Collection Time: 05/17/19 12:34 PM   Specimen: BLOOD  Result Value Ref Range Status   Specimen Description   Final    BLOOD RIGHT ANTECUBITAL Performed at [facility], [address] [place], [address] [place]    Special Requests   Final    BOTTLES DRAWN AEROBIC AND ANAEROBIC Blood Culture adequate volume Performed at [facility], [address] [place], [address] [place]    Culture   Final    NO GROWTH < 24 HOURS Performed at  [facility], [address] [place], [place] [address]    Report Status PENDING  Incomplete  SARS CORONAVIRUS 2 (TAT 6-24 HRS) Nasopharyngeal Nasopharyngeal Swab     Status: None   Collection Time: 05/17/19  1:10 PM   Specimen: Nasopharyngeal Swab  Result Value Ref Range Status   SARS Coronavirus 2 NEGATIVE NEGATIVE Final    Comment: (NOTE) SARS-CoV-2 target nucleic acids are NOT DETECTED. The SARS-CoV-2 RNA is generally detectable in upper and lower respiratory specimens during the acute phase of infection. Negative results do not preclude SARS-CoV-2 infection, do not rule out co-infections with other pathogens, and should not be used as the sole basis for treatment or other patient management decisions. Negative results must be combined with clinical observations, patient history, and epidemiological information. The expected result is Negative. Fact Sheet for Patients: HairSlick.nohttps://www.fda.gov/media/138098/download Fact Sheet for Healthcare Providers: quierodirigir.comhttps://www.fda.gov/media/138095/download This test is not yet approved or cleared by the [place] FDA and  has been authorized for detection and/or diagnosis of SARS-CoV-2 by FDA under an  Emergency Use Authorization (EUA). This EUA will remain  in effect (meaning this test can be used) for the duration of the COVID-19 declaration under Section 56 4(b)(1) of the Act, 21 U.S.C. section 360bbb-3(b)(1), unless the authorization is terminated or revoked sooner. Performed at [facility], [address] [place], [place] [address]   Body fluid culture     Status: None (Preliminary result)   Collection Time: 05/18/19 10:21 AM   Specimen: Ankle; Body Fluid  Result Value Ref Range Status   Specimen Description   Final    ANKLE RIGHT Performed at [facility], [address] [place], [place] [address]    Special Requests   Final    Normal Performed at [facility], [address] [place], [place] [address]    Gram Stain   Final    RARE WBC PRESENT, PREDOMINANTLY PMN ABUNDANT GRAM POSITIVE COCCI Performed at [facility], [address] [place], [place] [address]    Culture PENDING  Incomplete   Report Status PENDING  Incomplete         Radiology Studies: Dg Foot Complete Right  Result Date: 05/17/2019 CLINICAL DATA:  Blister lateral right heel. EXAM: RIGHT FOOT COMPLETE - 3+ VIEW COMPARISON:  None. FINDINGS: No acute bony abnormality. Specifically, no fracture, subluxation, or dislocation. No bone destruction to suggest osteomyelitis. No soft tissue foreign body. IMPRESSION: Negative. Electronically Signed   By: Charlett NoseKevin  Dover M.D.   On: 05/17/2019 12:09        Scheduled Meds: . heparin  5,000 Units Subcutaneous Q8H  . insulin aspart  0-5 Units Subcutaneous QHS  . insulin aspart  0-9 Units Subcutaneous TID WC  . nicotine  21 mg Transdermal Daily   Continuous Infusions: . vancomycin 1,500 mg (05/18/19 1247)     LOS: 1 day    Time spent: 30 minutes    Dorcas CarrowKuber Lauren Modisette, MD [facility] Pager [phone]  If 7PM-7AM, please contact night-coverage [email or web address] Password [email or web address] 05/18/2019, 2:43 PM

## 2019-05-19 LAB — GLUCOSE, CAPILLARY
Glucose-Capillary: 272 mg/dL — ABNORMAL HIGH (ref 70–99)
Glucose-Capillary: 184 mg/dL — ABNORMAL HIGH (ref 70–99)
Glucose-Capillary: 276 mg/dL — ABNORMAL HIGH (ref 70–99)
Glucose-Capillary: 201 mg/dL — ABNORMAL HIGH (ref 70–99)

## 2019-05-19 MED ORDER — PIPERACILLIN-TAZOBACTAM 4.5 G IVPB: 4.5000 g | Freq: Four times a day (QID) | INTRAVENOUS | Status: DC

## 2019-05-19 MED ORDER — PIPERACILLIN-TAZOBACTAM 3.375 G IVPB
3.3750 g | Freq: Three times a day (TID) | INTRAVENOUS | Status: DC
  Administered 2019-05-19 – 2019-05-21 (×7): 3.375 g via INTRAVENOUS
  Filled 2019-05-19 (×8): qty 50

## 2019-05-19 NOTE — Progress Notes (Signed)
PROGRESS NOTE    Tanner SkainsVincent [name]  ZOX:[id number] DOB: 11/03/1965 DOA: 05/17/2019 PCP: Patient, No Pcp Per    Brief Narrative:  53 year old gentleman with history of diabetes, currently not using medication, smoker who presented to the hospital with right foot pain.  Patient initially noticed a blister on the right heel, remembers slipping off and falling on steps few days ago with severe pain on that side.  Patient was seen in the emergency room on 9/29.  UDS was positive for amphetamines and THC.  Was discharged home.  Came back to the ER in 2 days with continuous pain on the right heel with bulla formation.  Patient had normal white cell count, COVID-19 negative.  Afebrile.  Was found to have spreading cellulitis of the right foot.   Assessment & Plan:   Principal Problem:   Cellulitis of right foot Active Problems:   Diabetes mellitus without complication (HCC)   Tobacco abuse   Drug abuse (HCC)  Cellulitis of the right foot/diabetic foot infection: Probably started with trauma.  No penetrating injury.  Has been big bullae formation. Blood cultures negative so far. Spreading inflammation on diabetic patient.  No evidence of deep infection or collection. Wound aspiration growing a staph aureus. Was on vancomycin, continues to have a spreading inflammation, will add Zosyn until final cultures or clinical improvement. Dry dressing, elevate leg.  Type 2 diabetes: A1c 8.  Patient not on treatment.  Used to be on metformin.  Currently on sliding scale insulin.  Will prescribe metformin on discharge.  Smoker: Counseled to quit.  On nicotine patch.  DVT prophylaxis: Heparin subcu Code Status: Full code Family Communication: None Disposition Plan: Home.  Anticipate in the next 24 to 48 hours   Consultants:   None  Procedures:   None  Antimicrobials:   Vancomycin, 05/17/2019----  Zosyn, 05/19/2019----   Subjective: Patient seen and examined.  Afebrile overnight.  Continues  to have some pain mostly on the lateral aspect.  Drainage of the wound from ruptured bullae.   Objective: Vitals:   05/18/19 1154 05/18/19 1255 05/18/19 2102 05/19/19 0610  BP: 124/64 135/82 (!) 160/76 (!) 141/79  Pulse: 62 69 81 67  Resp: 16 16 14 16   Temp: 98.4 F (36.9 C) 98 F (36.7 C) 98.2 F (36.8 C) 97.9 F (36.6 C)  TempSrc: Oral Oral Oral Oral  SpO2: 98% 99% 98% 98%  Weight:      Height:        Intake/Output Summary (Last 24 hours) at 05/19/2019 1224 Last data filed at 05/19/2019 0900 Gross per 24 hour  Intake 1012.91 ml  Output 900 ml  Net 112.91 ml   Filed Weights   05/17/19 1109  Weight: 79.4 kg    Examination:  General exam: Appears calm and comfortable  Respiratory system: Clear to auscultation. Respiratory effort normal. Cardiovascular system: S1 & S2 heard, RRR. No JVD, murmurs, rubs, gallops or clicks. No pedal edema. Gastrointestinal system: Abdomen is nondistended, soft and nontender. No organomegaly or masses felt. Normal bowel sounds heard. Central nervous system: Alert and oriented. No focal neurological deficits. Extremities: Symmetric 5 x 5 power. Skin: No rashes, lesions or ulcers Psychiatry: Judgement and insight appear normal. Mood & affect appropriate.  Swelling and erythema right foot, dorsum of the foot extending to above the ankle, no fluctuation.  Bulla ruptured, serosanguineous fluid. No much change in surrounding inflammation for last 24 hours.    Data Reviewed: I have personally reviewed following labs and imaging studies  CBC: Recent Labs  Lab 05/15/19 1802 05/17/19 1224 05/18/19 0545  WBC 13.2* 9.7 7.4  NEUTROABS 10.3* 8.2*  --   HGB 14.5 12.4* 12.0*  HCT 41.5 36.5* 35.1*  MCV 84.2 85.7 86.0  PLT 361 295 960   Basic Metabolic Panel: Recent Labs  Lab 05/15/19 1802 05/17/19 1224 05/18/19 0545  NA 134* 131* 137  K 3.8 4.2 3.5  CL 96* 97* 104  CO2 27 25 26   GLUCOSE 186* 288* 162*  BUN 14 10 6   CREATININE 0.82  0.72 0.81  CALCIUM 9.4 8.8* 8.8*   GFR: Estimated Creatinine Clearance: 117.1 mL/min (by C-G formula based on SCr of 0.81 mg/dL). Liver Function Tests: Recent Labs  Lab 05/15/19 1802 05/17/19 1224  AST 24 17  ALT 27 19  ALKPHOS 124 100  BILITOT 0.6 0.4  PROT 8.1 7.1  ALBUMIN 4.5 3.6   No results for input(s): LIPASE, AMYLASE in the last 168 hours. Recent Labs  Lab 05/15/19 1802  AMMONIA 28   Coagulation Profile: Recent Labs  Lab 05/17/19 1224  INR 1.0   Cardiac Enzymes: No results for input(s): CKTOTAL, CKMB, CKMBINDEX, TROPONINI in the last 168 hours. BNP (last 3 results) No results for input(s): PROBNP in the last 8760 hours. HbA1C: Recent Labs    05/18/19 0545  HGBA1C 8.1*   CBG: Recent Labs  Lab 05/18/19 1139 05/18/19 1642 05/18/19 2106 05/19/19 0751 05/19/19 1154  GLUCAP 170* 160* 185* 272* 184*   Lipid Profile: No results for input(s): CHOL, HDL, LDLCALC, TRIG, CHOLHDL, LDLDIRECT in the last 72 hours. Thyroid Function Tests: No results for input(s): TSH, T4TOTAL, FREET4, T3FREE, THYROIDAB in the last 72 hours. Anemia Panel: No results for input(s): VITAMINB12, FOLATE, FERRITIN, TIBC, IRON, RETICCTPCT in the last 72 hours. Sepsis Labs: No results for input(s): PROCALCITON, LATICACIDVEN in the last 168 hours.  Recent Results (from the past 240 hour(s))  Blood Culture (routine x 2)     Status: None (Preliminary result)   Collection Time: 05/17/19 12:24 PM   Specimen: BLOOD  Result Value Ref Range Status   Specimen Description   Final    BLOOD BLOOD LEFT FOREARM Performed at [facility], [address] [place], [place] [address]    Special Requests   Final    BOTTLES DRAWN AEROBIC AND ANAEROBIC Blood Culture adequate volume Performed at [facility], [address] [place], [place] [address]    Culture   Final    NO GROWTH 2 DAYS Performed at [facility], [place] [address] [place], [place] [address]    Report  Status PENDING  Incomplete  Blood Culture (routine x 2)     Status: None (Preliminary result)   Collection Time: 05/17/19 12:34 PM   Specimen: BLOOD  Result Value Ref Range Status   Specimen Description   Final    BLOOD RIGHT ANTECUBITAL Performed at [facility], [address] [place], [place] [address]    Special Requests   Final    BOTTLES DRAWN AEROBIC AND ANAEROBIC Blood Culture adequate volume Performed at [facility], [address] [place], [place] [address]    Culture   Final    NO GROWTH 2 DAYS Performed at [facility], [place] [address] [place], [place] [address]    Report Status PENDING  Incomplete  SARS CORONAVIRUS 2 (TAT 6-24 HRS) Nasopharyngeal Nasopharyngeal Swab     Status: None  Collection Time: 05/17/19  1:10 PM   Specimen: Nasopharyngeal Swab  Result Value Ref Range Status   SARS Coronavirus 2 NEGATIVE NEGATIVE Final    Comment: (NOTE) SARS-CoV-2 target nucleic acids are NOT DETECTED. The SARS-CoV-2 RNA is generally detectable in upper and lower respiratory specimens during the acute phase of infection. Negative results do not preclude SARS-CoV-2 infection, do not rule out co-infections with other pathogens, and should not be used as the sole basis for treatment or other patient management decisions. Negative results must be combined with clinical observations, patient history, and epidemiological information. The expected result is Negative. Fact Sheet for Patients: HairSlick.no Fact Sheet for Healthcare Providers: quierodirigir.com This test is not yet approved or cleared by the [place] FDA and  has been authorized for detection and/or diagnosis of SARS-CoV-2 by FDA under an Emergency Use Authorization (EUA). This EUA will remain  in effect (meaning this test can be used) for the duration of the COVID-19 declaration under Section 56 4(b)(1) of the Act, 21 U.S.C.  section 360bbb-3(b)(1), unless the authorization is terminated or revoked sooner. Performed at [facility], [address], [place], [place] [address]   Body fluid culture     Status: None (Preliminary result)   Collection Time: 05/18/19 10:21 AM   Specimen: Ankle; Body Fluid  Result Value Ref Range Status   Specimen Description   Final    ANKLE RIGHT Performed at [facility], [address], [place], [place] [address]    Special Requests   Final    Normal Performed at [facility], [address] [place], [place] [address]    Gram Stain   Final    RARE WBC PRESENT, PREDOMINANTLY PMN ABUNDANT GRAM POSITIVE COCCI    Culture   Final    ABUNDANT STAPHYLOCOCCUS AUREUS SUSCEPTIBILITIES TO FOLLOW Performed at [facility], [address] [place], [place] [address]    Report Status PENDING  Incomplete         Radiology Studies: No results found.      Scheduled Meds: . heparin  5,000 Units Subcutaneous Q8H  . insulin aspart  0-5 Units Subcutaneous QHS  . insulin aspart  0-9 Units Subcutaneous TID WC  . nicotine  21 mg Transdermal Daily   Continuous Infusions: . piperacillin-tazobactam (ZOSYN)  IV    . vancomycin 1,500 mg (05/19/19 0220)     LOS: 2 days    Time spent: 25 minutes    Dorcas Carrow, MD [facility] Pager [phone]  If 7PM-7AM, please contact night-coverage [email or web address] Password [email or web address] 05/19/2019, 12:24 PM

## 2019-05-20 LAB — CREATININE, SERUM
Creatinine, Ser: 0.83 mg/dL (ref 0.61–1.24)
GFR calc Af Amer: >60 mL/min (ref >60)
GFR calc non Af Amer: >60 mL/min (ref >60)

## 2019-05-20 LAB — GLUCOSE, CAPILLARY
Glucose-Capillary: 206 mg/dL — ABNORMAL HIGH (ref 70–99)
Glucose-Capillary: 218 mg/dL — ABNORMAL HIGH (ref 70–99)
Glucose-Capillary: 223 mg/dL — ABNORMAL HIGH (ref 70–99)
Glucose-Capillary: 250 mg/dL — ABNORMAL HIGH (ref 70–99)

## 2019-05-20 MED ORDER — BISACODYL 10 MG RE SUPP
10.0000 mg | Freq: Once | RECTAL | Status: AC
  Administered 2019-05-20: 10 mg via RECTAL
  Filled 2019-05-20: qty 1

## 2019-05-20 NOTE — Progress Notes (Signed)
Pharmacy Antibiotic Note  Tanner [name] is a 53 y.o. male with hx DM presented to the ED on 05/17/2019 with AMS and blisters/large bulla on right heel.  He was started on vancomycin on admission and zosyn added on 10/3 for infection.  Today, 05/20/2019: - day #4 abx - afeb, wbc wnl - right ankle fluid cx is growing out MRSA  Plan: - continue vancomycin 1500 mg IV q12h - if to continue with IV vancomycin, pharmacy will plan on checking levels in the next couple of days - zosyn 3.375 gm IV q8h (infuse over 4 hrs).  With MRSA in ankle fluid cx, consider d/cing zosyn  _________________________________  Height: 6\' 1"  (185.4 cm) Weight: 175 lb (79.4 kg) IBW/kg (Calculated) : 79.9  Temp (24hrs), Avg:97.8 F (36.6 C), Min:97.5 F (36.4 C), Max:98 F (36.7 C)  Recent Labs  Lab 05/15/19 1802 05/17/19 1224 05/18/19 0545 05/20/19 0445  WBC 13.2* 9.7 7.4  --   CREATININE 0.82 0.72 0.81 0.83    Estimated Creatinine Clearance: 114.3 mL/min (by C-G formula based on SCr of 0.83 mg/dL).    No Known Allergies  Antimicrobials this admission:  10/1 CTX x1 10/1 Vanc>> 10/3 Zosyn>>  Microbiology results:  10/1 BCx x2:  10/2 right ankle: abundant MRSA   Thank you for allowing pharmacy to be a part of this patient's care.  Lynelle Doctor 05/20/2019 11:07 AM

## 2019-05-20 NOTE — Progress Notes (Signed)
PROGRESS NOTE    Tanner SkainsVincent [name]  WUJ:[id number] DOB: 09/07/1965 DOA: 05/17/2019 PCP: Patient, No Pcp Per    Brief Narrative:  53 year old gentleman with history of diabetes, currently not using medication, smoker who presented to the hospital with right foot pain.  Patient initially noticed a blister on the right heel, remembers slipping off and falling on steps few days ago with severe pain on that side.  Patient was seen in the emergency room on 9/29.  UDS was positive for amphetamines and THC.  Was discharged home.  Came back to the ER in 2 days with continuous pain on the right heel with bulla formation.  Patient had normal white cell count, COVID-19 negative.  Afebrile.  Was found to have spreading cellulitis of the right foot.   Assessment & Plan:   Principal Problem:   Cellulitis of right foot Active Problems:   Diabetes mellitus without complication (HCC)   Tobacco abuse   Drug abuse (HCC)  Cellulitis of the right foot/diabetic foot infection: Probably started with trauma.  No penetrating injury. Superficial bulla formation. Blood cultures negative so far. Spreading inflammation on diabetic patient.  No evidence of abscess.  Peripheral circulation is adequate. Wound aspirate with MRSA. Still suspect polymicrobial infection. Continue vancomycin, Zosyn today. Dry dressing and elevate the affected part.  Type 2 diabetes: A1c 8.  Patient not on treatment.  Used to be on metformin.  Currently on sliding scale insulin.  Will prescribe metformin on discharge.  Smoker: Counseled to quit.  On nicotine patch.  DVT prophylaxis: Heparin subcu Code Status: Full code Family Communication: None Disposition Plan: Home.  Anticipate tomorrow if continues to improve.   Consultants:   None  Procedures:   None  Antimicrobials:   Vancomycin, 05/17/2019----  Zosyn, 05/19/2019----   Subjective: Patient seen and examined.  Afebrile overnight.  Pain on the right foot.  Objective:  Vitals:   05/19/19 1400 05/19/19 2038 05/20/19 0547 05/20/19 1327  BP: 122/71 136/77 (!) 152/81 (!) 142/72  Pulse: 61 60 (!) 56 61  Resp: 15 18 18 18   Temp: 97.9 F (36.6 C) 98 F (36.7 C) (!) 97.5 F (36.4 C) 98.5 F (36.9 C)  TempSrc: Oral Oral Oral Oral  SpO2: 97% 97% 98% 96%  Weight:      Height:        Intake/Output Summary (Last 24 hours) at 05/20/2019 1446 Last data filed at 05/20/2019 1324 Gross per 24 hour  Intake 2198.03 ml  Output 1950 ml  Net 248.03 ml   Filed Weights   05/17/19 1109  Weight: 79.4 kg    Examination:  General exam: Appears calm and comfortable  Respiratory system: Clear to auscultation. Respiratory effort normal. Cardiovascular system: S1 & S2 heard, RRR. No JVD, murmurs, rubs, gallops or clicks. No pedal edema. Gastrointestinal system: Abdomen is nondistended, soft and nontender. No organomegaly or masses felt. Normal bowel sounds heard. Central nervous system: Alert and oriented. No focal neurological deficits. Extremities: Symmetric 5 x 5 power. Skin: No rashes, lesions or ulcers Psychiatry: Judgement and insight appear normal. Mood & affect appropriate.  Ruptured bullae with serosanguineous drainage, surrounding erythema and swelling with pitting edema, leg erythema receding, persistent erythema of the dorsum of the foot.  Data Reviewed: I have personally reviewed following labs and imaging studies  CBC: Recent Labs  Lab 05/15/19 1802 05/17/19 1224 05/18/19 0545  WBC 13.2* 9.7 7.4  NEUTROABS 10.3* 8.2*  --   HGB 14.5 12.4* 12.0*  HCT 41.5 36.5* 35.1*  MCV 84.2 85.7 86.0  PLT 361 295 266   Basic Metabolic Panel: Recent Labs  Lab 05/15/19 1802 05/17/19 1224 05/18/19 0545 05/20/19 0445  NA 134* 131* 137  --   K 3.8 4.2 3.5  --   CL 96* 97* 104  --   CO2 27 25 26   --   GLUCOSE 186* 288* 162*  --   BUN 14 10 6   --   CREATININE 0.82 0.72 0.81 0.83  CALCIUM 9.4 8.8* 8.8*  --    GFR: Estimated Creatinine Clearance: 114.3  mL/min (by C-G formula based on SCr of 0.83 mg/dL). Liver Function Tests: Recent Labs  Lab 05/15/19 1802 05/17/19 1224  AST 24 17  ALT 27 19  ALKPHOS 124 100  BILITOT 0.6 0.4  PROT 8.1 7.1  ALBUMIN 4.5 3.6   No results for input(s): LIPASE, AMYLASE in the last 168 hours. Recent Labs  Lab 05/15/19 1802  AMMONIA 28   Coagulation Profile: Recent Labs  Lab 05/17/19 1224  INR 1.0   Cardiac Enzymes: No results for input(s): CKTOTAL, CKMB, CKMBINDEX, TROPONINI in the last 168 hours. BNP (last 3 results) No results for input(s): PROBNP in the last 8760 hours. HbA1C: Recent Labs    05/18/19 0545  HGBA1C 8.1*   CBG: Recent Labs  Lab 05/19/19 1154 05/19/19 1613 05/19/19 2042 05/20/19 0718 05/20/19 1136  GLUCAP 184* 276* 201* 206* 218*   Lipid Profile: No results for input(s): CHOL, HDL, LDLCALC, TRIG, CHOLHDL, LDLDIRECT in the last 72 hours. Thyroid Function Tests: No results for input(s): TSH, T4TOTAL, FREET4, T3FREE, THYROIDAB in the last 72 hours. Anemia Panel: No results for input(s): VITAMINB12, FOLATE, FERRITIN, TIBC, IRON, RETICCTPCT in the last 72 hours. Sepsis Labs: No results for input(s): PROCALCITON, LATICACIDVEN in the last 168 hours.  Recent Results (from the past 240 hour(s))  Blood Culture (routine x 2)     Status: None (Preliminary result)   Collection Time: 05/17/19 12:24 PM   Specimen: BLOOD  Result Value Ref Range Status   Specimen Description   Final    BLOOD BLOOD LEFT FOREARM Performed at [facility], [address] [place], [address] [place]    Special Requests   Final    BOTTLES DRAWN AEROBIC AND ANAEROBIC Blood Culture adequate volume Performed at [facility], [address] [place], [address] [place]    Culture   Final    NO GROWTH 3 DAYS Performed at [facility], [address], [place], [address] [place]    Report Status PENDING  Incomplete  Blood Culture (routine x 2)     Status: None  (Preliminary result)   Collection Time: 05/17/19 12:34 PM   Specimen: BLOOD  Result Value Ref Range Status   Specimen Description   Final    BLOOD RIGHT ANTECUBITAL Performed at [facility], [address] [place], [address] [place]    Special Requests   Final    BOTTLES DRAWN AEROBIC AND ANAEROBIC Blood Culture adequate volume Performed at [facility], [address] [place], [address] [place]    Culture   Final    NO GROWTH 3 DAYS Performed at [facility], [address] [place], [address] [place]    Report Status PENDING  Incomplete  SARS CORONAVIRUS 2 (TAT 6-24 HRS) Nasopharyngeal Nasopharyngeal Swab     Status: None   Collection Time: 05/17/19  1:10 PM   Specimen: Nasopharyngeal  Swab  Result Value Ref Range Status   SARS Coronavirus 2 NEGATIVE NEGATIVE Final    Comment: (NOTE) SARS-CoV-2 target nucleic acids are NOT DETECTED. The SARS-CoV-2 RNA is generally detectable in upper and lower respiratory specimens during the acute phase of infection. Negative results do not preclude SARS-CoV-2 infection, do not rule out co-infections with other pathogens, and should not be used as the sole basis for treatment or other patient management decisions. Negative results must be combined with clinical observations, patient history, and epidemiological information. The expected result is Negative. Fact Sheet for Patients: SugarRoll.be Fact Sheet for Healthcare Providers: https://www.woods-mathews.com/ This test is not yet approved or cleared by the [place] FDA and  has been authorized for detection and/or diagnosis of SARS-CoV-2 by FDA under an Emergency Use Authorization (EUA). This EUA will remain  in effect (meaning this test can be used) for the duration of the COVID-19 declaration under Section 56 4(b)(1) of the Act, 21 U.S.C. section 360bbb-3(b)(1), unless the authorization is terminated or revoked  sooner. Performed at [facility], [place] [address], [place], [place] [address]   Body fluid culture     Status: None (Preliminary result)   Collection Time: 05/18/19 10:21 AM   Specimen: Ankle; Body Fluid  Result Value Ref Range Status   Specimen Description   Final    ANKLE RIGHT Performed at [facility] [place] [address] [place], [place] [address]    Special Requests   Final    Normal Performed at [facility], [place] [address] [place], [place] [address]    Gram Stain   Final    RARE WBC PRESENT, PREDOMINANTLY PMN ABUNDANT GRAM POSITIVE COCCI Performed at [facility], [place] [address], [place], [place] [address]    Culture   Final    ABUNDANT METHICILLIN RESISTANT STAPHYLOCOCCUS AUREUS   Report Status PENDING  Incomplete   Organism ID, Bacteria METHICILLIN RESISTANT STAPHYLOCOCCUS AUREUS  Final      Susceptibility   Methicillin resistant staphylococcus aureus - MIC*    CIPROFLOXACIN >=8 RESISTANT Resistant     ERYTHROMYCIN >=8 RESISTANT Resistant     GENTAMICIN <=0.5 SENSITIVE Sensitive     OXACILLIN >=4 RESISTANT Resistant     TETRACYCLINE <=1 SENSITIVE Sensitive     VANCOMYCIN 1 SENSITIVE Sensitive     TRIMETH/SULFA 160 RESISTANT Resistant     CLINDAMYCIN >=8 RESISTANT Resistant     RIFAMPIN <=0.5 SENSITIVE Sensitive     Inducible Clindamycin NEGATIVE Sensitive     * ABUNDANT METHICILLIN RESISTANT STAPHYLOCOCCUS AUREUS         Radiology Studies: No results found.      Scheduled Meds: . heparin  5,000 Units Subcutaneous Q8H  . insulin aspart  0-5 Units Subcutaneous QHS  . insulin aspart  0-9 Units Subcutaneous TID WC  . nicotine  21 mg Transdermal Daily   Continuous Infusions: . piperacillin-tazobactam (ZOSYN)  IV 3.375 g (05/20/19 0836)  . vancomycin 1,500 mg (05/20/19 1312)     LOS: 3 days    Time spent: 25 minutes    Barb Merino, MD [facility] Pager [phone]  If 7PM-7AM, please  contact night-coverage [email or web address] Password [email or web address] 05/20/2019, 2:46 PM

## 2019-05-21 LAB — BODY FLUID CULTURE: Special Requests: NORMAL

## 2019-05-21 LAB — CREATININE, SERUM
Creatinine, Ser: 0.8 mg/dL (ref 0.61–1.24)
GFR calc Af Amer: >60 mL/min (ref >60)
GFR calc non Af Amer: >60 mL/min (ref >60)

## 2019-05-21 LAB — GLUCOSE, CAPILLARY
Glucose-Capillary: 233 mg/dL — ABNORMAL HIGH (ref 70–99)
Glucose-Capillary: 197 mg/dL — ABNORMAL HIGH (ref 70–99)
Glucose-Capillary: 302 mg/dL — ABNORMAL HIGH (ref 70–99)

## 2019-05-21 MED ORDER — ACETAMINOPHEN 500 MG PO TABS: 1000.0000 mg | ORAL_TABLET | Freq: Four times a day (QID) | ORAL | 0 refills | Status: DC | PRN

## 2019-05-21 MED ORDER — METFORMIN HCL 1000 MG PO TABS: 1000.0000 mg | ORAL_TABLET | Freq: Two times a day (BID) | ORAL | 0 refills | Status: DC

## 2019-05-21 MED ORDER — CLINDAMYCIN HCL 300 MG PO CAPS: 300.0000 mg | ORAL_CAPSULE | Freq: Three times a day (TID) | ORAL | 0 refills | Status: AC

## 2019-05-21 MED ORDER — TAMSULOSIN HCL 0.4 MG PO CAPS: 0.4000 mg | ORAL_CAPSULE | Freq: Every day | ORAL | 0 refills | Status: AC

## 2019-05-21 MED ORDER — CLINDAMYCIN HCL 300 MG PO CAPS: 300.0000 mg | ORAL_CAPSULE | Freq: Three times a day (TID) | ORAL | 0 refills | Status: DC

## 2019-05-21 MED FILL — TAMSULOSIN HCL 0.4 MG CAP: 0.4 | 30 days supply | Qty: 30 | Fill #0

## 2019-05-21 MED FILL — CLINDAMYCIN HCL 300 MG CAPS: 300 | 7 days supply | Qty: 21 | Fill #0

## 2019-05-21 MED FILL — metFORMIN HCL 1000 MG TABS: 1000 | 30 days supply | Qty: 60 | Fill #0

## 2019-05-21 NOTE — TOC Transition Note (Signed)
Transition of Care [facility]) - CM/SW Discharge Note   Patient Details  Name: Tanner [name] MRN: [id number] Date of Birth: [birth year]  Transition of Care [facility]) CM/SW Contact:  Nila Nephew, LCSW Phone Number: [phone] 05/21/2019, 3:24 PM   Clinical Narrative:   Pt admitted with cellulitis/diabetic ulcer right foot. Has been staying in hotel for some time, states he "lives between here and [place], just came back into [place] recently." Pt's ex-wife and daughter he reports are strong social supports here (ex wife at bedside), provide him transportation and care- pt plans to either stay with family or at hotel at [place].  Attempted to secure charity home health for wound care, however, pt declined some of the information needed (address, finances), and states he feels he and family can continue learning dressing changes to manage at home. Ex wife present and agrees. Declined wound care clinic referral. Pt did request assistance with finding PCP and reviewed clinic options as pt is uninsured- assisted in scheduling with [facility] [place] 05/30/19 (on AVS). Pt's ex wife states she can transport him to appointment.  Pt states he generally gets his medications filled at [facility], however has coupons for [facility] and may "check with both," CSW did assist with coupons on [employer]. Ex wife states she will transport to pharmacies as well.  Declined further needs but thanked staff for his care while inpatient.   Final next level of care: Home/Self Care Barriers to Discharge: No Barriers Identified   Patient Goals and CMS Choice Patient states their goals for this hospitalization and ongoing recovery are:: "Pick up my medications and follow up" CMS Medicare.gov Compare Post Acute Care list provided to:: Patient Choice offered to / list presented to : Patient  Discharge Placement                       Discharge Plan and Services In-house Referral: Clinical Social Work Discharge Planning  Services: CM Consult                                 Social Determinants of Health (SDOH) Interventions     Readmission Risk Interventions No flowsheet data found.

## 2019-05-21 NOTE — Discharge Instructions (Signed)
Follow up appointment- establish PCP: [name] [place], [place] [address] [phone] 05/30/19 8am

## 2019-05-21 NOTE — Discharge Summary (Signed)
Physician Discharge Summary  Tanner [name] QMV:[id number] DOB: 05-20-1966 DOA: 05/17/2019  PCP: Patient, No Pcp Per  Admit date: 05/17/2019 Discharge date: 05/21/2019  Admitted From: Home Disposition: Home  Recommendations for Outpatient Follow-up:  1. Follow up with PCP in 2 weeks as scheduled.  Home Health: Declined Equipment/Devices: Declined by patient  Discharge Condition: Stable CODE STATUS: Full code Diet recommendation: Low-carb diet  Brief/Interim Summary: 53 year old gentleman with history of type 2 diabetes, recent homelessness, not on any treatment came to the emergency room with swelling and tenderness right lateral foot and blister formation probably after trauma.  He had significant cellulitis, was admitted to the hospital and treated with vancomycin and Zosyn.  Blood cultures negative.  Blister aspirate positive for MRSA.  Adequate clinical improvement.  Still has some surrounding swelling but no underlying abscess or necrosis.  Plan: Discharge home on 7 more days of clindamycin. Local wound care, thought to patient and ex-wife and they will do at home. Referral made for wound care clinic for evaluation and follow-up, patient declined. Home health wound care arrangements were made, patient declined. Hemoglobin A1c, 8, metformin thousand milligrams twice a day, prescriptions given. Primary care physician appointment made to follow in 2 weeks. Extensive counseling not to use any drugs or alcohol.  Discharge Diagnoses:  Principal Problem:   Cellulitis of right foot Active Problems:   Diabetes mellitus without complication ([place])   Tobacco abuse   Drug abuse [facility])    Discharge Instructions  Discharge Instructions    Diet Carb Modified   Complete by: As directed    Discharge instructions   Complete by: As directed    Clean with soap and water apply dry clean dressing.  Elevate leg  Take tylenol for pain   Increase activity slowly   Complete by: As directed       Allergies as of 05/21/2019   No Known Allergies     Medication List    TAKE these medications   acetaminophen 500 MG tablet Commonly known as: TYLENOL Take 2 tablets (1,000 mg total) by mouth every 6 (six) hours as needed for moderate pain (foot pain). What changed:   how much to take  when to take this   clindamycin 300 MG capsule Commonly known as: Cleocin Take 1 capsule (300 mg total) by mouth 3 (three) times daily for 7 days.   metFORMIN 1000 MG tablet Commonly known as: GLUCOPHAGE Take 1 tablet (1,000 mg total) by mouth 2 (two) times daily.   tamsulosin 0.4 MG Caps capsule Commonly known as: FLOMAX Take 1 capsule (0.4 mg total) by mouth daily.       No Known Allergies  Consultations:  Wound care   Procedures/Studies: Ct Head Wo Contrast  Result Date: 05/15/2019 CLINICAL DATA:  53 year old diabetic with acute mental status change. EXAM: CT HEAD WITHOUT CONTRAST TECHNIQUE: Contiguous axial images were obtained from the base of the skull through the vertex without intravenous contrast. COMPARISON:  None. FINDINGS: Brain: Ventricular system normal in size and appearance for age. No mass lesion. No midline shift. No acute hemorrhage or hematoma. No extra-axial fluid collections. No evidence of acute infarction. No focal brain parenchymal abnormalities. Vascular: Minimal BILATERAL carotid siphon atherosclerosis. No hyperdense vessel. Skull: No skull fracture or other focal osseous abnormality involving the skull. Sinuses/Orbits: Visualized paranasal sinuses, bilateral mastoid air cells and bilateral middle ear cavities well-aerated. Visualized orbits and globes normal in appearance. Other: None. IMPRESSION: No acute intracranial abnormality. Electronically Signed   By: Evangeline Dakin  M.D.   On: 05/15/2019 18:32   Dg Foot Complete Right  Result Date: 05/17/2019 CLINICAL DATA:  Blister lateral right heel. EXAM: RIGHT FOOT COMPLETE - 3+ VIEW COMPARISON:  None.  FINDINGS: No acute bony abnormality. Specifically, no fracture, subluxation, or dislocation. No bone destruction to suggest osteomyelitis. No soft tissue foreign body. IMPRESSION: Negative. Electronically Signed   By: Charlett NoseKevin  Dover M.D.   On: 05/17/2019 12:09      Subjective: Patient seen and examined.  Afebrile.  Still has some pain on the lateral leg on walking.  D[place]um of the foot and ankle swelling improved.   Discharge Exam: Vitals:   05/20/19 2048 05/21/19 0550  BP: 138/78 (!) 149/84  Pulse: 60 64  Resp: 19 18  Temp: 98.2 F (36.8 C) 98.1 F (36.7 C)  SpO2: 96% 94%   Vitals:   05/20/19 0547 05/20/19 1327 05/20/19 2048 05/21/19 0550  BP: (!) 152/81 (!) 142/72 138/78 (!) 149/84  Pulse: (!) 56 61 60 64  Resp: 18 18 19 18   Temp: (!) 97.5 F (36.4 C) 98.5 F (36.9 C) 98.2 F (36.8 C) 98.1 F (36.7 C)  TempSrc: Oral Oral Oral Oral  SpO2: 98% 96% 96% 94%  Weight:      Height:        General: Pt is alert, awake, not in acute distress Cardiovascular: RRR, S1/S2 +, no rubs, no gallops Respiratory: CTA bilaterally, no wheezing, no rhonchi Abdominal: Soft, NT, ND, bowel sounds + Extremities: no edema, no cyanosis Right lateral aspect of the leg, ruptured blister with some serous drainage, no underlying abscess collection or induration.  Redness and erythema receding.   The results of significant diagnostics from this hospitalization (including imaging, microbiology, ancillary and laboratory) are listed below for reference.     Microbiology: Recent Results (from the past 240 hour(s))  Blood Culture (routine x 2)     Status: None (Preliminary result)   Collection Time: 05/17/19 12:24 PM   Specimen: BLOOD  Result Value Ref Range Status   Specimen Description   Final    BLOOD BLOOD LEFT FOREARM Performed at [facility] [place], [address] [place], [place] [address]    Special Requests   Final    BOTTLES DRAWN AEROBIC AND ANAEROBIC Blood Culture adequate  volume Performed at [facility] [place], [address] [place], [place] [address]    Culture   Final    NO GROWTH 4 DAYS Performed at [facility], [address] [place], [place] [address]    Report Status PENDING  Incomplete  Blood Culture (routine x 2)     Status: None (Preliminary result)   Collection Time: 05/17/19 12:34 PM   Specimen: BLOOD  Result Value Ref Range Status   Specimen Description   Final    BLOOD RIGHT ANTECUBITAL Performed at [facility] [place], [address] [place], [place] [address]    Special Requests   Final    BOTTLES DRAWN AEROBIC AND ANAEROBIC Blood Culture adequate volume Performed at [facility] [place], [address] [place], [place] [address]    Culture   Final    NO GROWTH 4 DAYS Performed at [facility], [address] [place], [place] [address]    Report Status PENDING  Incomplete  SARS CORONAVIRUS 2 (TAT 6-24 HRS) Nasopharyngeal Nasopharyngeal Swab     Status: None   Collection Time: 05/17/19  1:10 PM   Specimen: Nasopharyngeal Swab  Result Value Ref Range Status   SARS Coronavirus 2 NEGATIVE NEGATIVE Final    Comment: (NOTE) SARS-CoV-2 target nucleic acids are NOT DETECTED. The SARS-CoV-2 RNA is generally detectable in upper and lower respiratory specimens during the acute phase of infection. Negative results do not preclude SARS-CoV-2 infection, do not rule out co-infections with other pathogens, and should not be used as the sole basis for treatment or other patient management decisions. Negative results must be combined with clinical observations, patient history, and epidemiological information. The expected result is Negative. Fact Sheet for Patients: HairSlick.no Fact Sheet for Healthcare Providers: quierodirigir.com This test is not yet approved or cleared by the [place] FDA and  has been authorized for detection and/or diagnosis of  SARS-CoV-2 by FDA under an Emergency Use Authorization (EUA). This EUA will remain  in effect (meaning this test can be used) for the duration of the COVID-19 declaration under Section 56 4(b)(1) of the Act, 21 U.S.C. section 360bbb-3(b)(1), unless the authorization is terminated or revoked sooner. Performed at [facility], [address] [place], [place] [address]   Body fluid culture     Status: None   Collection Time: 05/18/19 10:21 AM   Specimen: Ankle; Body Fluid  Result Value Ref Range Status   Specimen Description   Final    ANKLE RIGHT Performed at [facility], [address], [place], [place] [address]    Special Requests   Final    Normal Performed at [facility], [address] [place], [place] [address]    Gram Stain   Final    RARE WBC PRESENT, PREDOMINANTLY PMN ABUNDANT GRAM POSITIVE COCCI Performed at [facility], [address], [place], [place] [address]    Culture   Final    ABUNDANT METHICILLIN RESISTANT STAPHYLOCOCCUS AUREUS   Report Status 05/21/2019 FINAL  Final   Organism ID, Bacteria METHICILLIN RESISTANT STAPHYLOCOCCUS AUREUS  Final      Susceptibility   Methicillin resistant staphylococcus aureus - MIC*    CIPROFLOXACIN >=8 RESISTANT Resistant     ERYTHROMYCIN >=8 RESISTANT Resistant     GENTAMICIN <=0.5 SENSITIVE Sensitive     OXACILLIN >=4 RESISTANT Resistant     TETRACYCLINE <=1 SENSITIVE Sensitive     VANCOMYCIN 1 SENSITIVE Sensitive     TRIMETH/SULFA 160 RESISTANT Resistant     CLINDAMYCIN >=8 RESISTANT Resistant     RIFAMPIN <=0.5 SENSITIVE Sensitive     Inducible Clindamycin NEGATIVE Sensitive     * ABUNDANT METHICILLIN RESISTANT STAPHYLOCOCCUS AUREUS     Labs: BNP (last 3 results) No results for input(s): BNP in the last 8760 hours. Basic Metabolic Panel: Recent Labs  Lab 05/15/19 1802 05/17/19 1224 05/18/19 0545 05/20/19 0445 05/21/19 0608  NA 134* 131* 137  --   --   K  3.8 4.2 3.5  --   --   CL 96* 97* 104  --   --   CO2 27 25 26   --   --   GLUCOSE 186* 288* 162*  --   --   BUN 14 10 6   --   --   CREATININE 0.82 0.72 0.81 0.83 0.80  CALCIUM 9.4 8.8* 8.8*  --   --    Liver Function Tests: Recent Labs  Lab 05/15/19 1802 05/17/19 1224  AST 24 17  ALT 27 19  ALKPHOS 124 100  BILITOT 0.6 0.4  PROT 8.1 7.1  ALBUMIN 4.5 3.6   No results  for input(s): LIPASE, AMYLASE in the last 168 hours. Recent Labs  Lab 05/15/19 1802  AMMONIA 28   CBC: Recent Labs  Lab 05/15/19 1802 05/17/19 1224 05/18/19 0545  WBC 13.2* 9.7 7.4  NEUTROABS 10.3* 8.2*  --   HGB 14.5 12.4* 12.0*  HCT 41.5 36.5* 35.1*  MCV 84.2 85.7 86.0  PLT 361 295 266   Cardiac Enzymes: No results for input(s): CKTOTAL, CKMB, CKMBINDEX, TROPONINI in the last 168 hours. BNP: Invalid input(s): POCBNP CBG: Recent Labs  Lab 05/20/19 1136 05/20/19 1618 05/20/19 2054 05/21/19 0731 05/21/19 1241  GLUCAP 218* 223* 250* 233* 197*   D-Dimer No results for input(s): DDIMER in the last 72 hours. Hgb A1c No results for input(s): HGBA1C in the last 72 hours. Lipid Profile No results for input(s): CHOL, HDL, LDLCALC, TRIG, CHOLHDL, LDLDIRECT in the last 72 hours. Thyroid function studies No results for input(s): TSH, T4TOTAL, T3FREE, THYROIDAB in the last 72 hours.  Invalid input(s): FREET3 Anemia work up No results for input(s): VITAMINB12, FOLATE, FERRITIN, TIBC, IRON, RETICCTPCT in the last 72 hours. Urinalysis    Component Value Date/Time   COLORURINE YELLOW 05/15/2019 1802   APPEARANCEUR CLEAR 05/15/2019 1802   LABSPEC >1.030 (H) 05/15/2019 1802   PHURINE 6.0 05/15/2019 1802   GLUCOSEU NEGATIVE 05/15/2019 1802   HGBUR NEGATIVE 05/15/2019 1802   BILIRUBINUR NEGATIVE 05/15/2019 1802   KETONESUR NEGATIVE 05/15/2019 1802   PROTEINUR NEGATIVE 05/15/2019 1802   NITRITE NEGATIVE 05/15/2019 1802   LEUKOCYTESUR NEGATIVE 05/15/2019 1802   Sepsis Labs Invalid input(s):  PROCALCITONIN,  WBC,  LACTICIDVEN Microbiology Recent Results (from the past 240 hour(s))  Blood Culture (routine x 2)     Status: None (Preliminary result)   Collection Time: 05/17/19 12:24 PM   Specimen: BLOOD  Result Value Ref Range Status   Specimen Description   Final    BLOOD BLOOD LEFT FOREARM Performed at [facility], [address] [place], [place] [address]    Special Requests   Final    BOTTLES DRAWN AEROBIC AND ANAEROBIC Blood Culture adequate volume Performed at [facility], [address] [place], [place] [address]    Culture   Final    NO GROWTH 4 DAYS Performed at [facility], [address], [place], [place] [address]    Report Status PENDING  Incomplete  Blood Culture (routine x 2)     Status: None (Preliminary result)   Collection Time: 05/17/19 12:34 PM   Specimen: BLOOD  Result Value Ref Range Status   Specimen Description   Final    BLOOD RIGHT ANTECUBITAL Performed at [facility], [address] [place], [place] [address]    Special Requests   Final    BOTTLES DRAWN AEROBIC AND ANAEROBIC Blood Culture adequate volume Performed at [facility], [address] [place], [place] [address]    Culture   Final    NO GROWTH 4 DAYS Performed at [facility], [address], [place], [place] [address]    Report Status PENDING  Incomplete  SARS CORONAVIRUS 2 (TAT 6-24 HRS) Nasopharyngeal Nasopharyngeal Swab     Status: None   Collection Time: 05/17/19  1:10 PM   Specimen: Nasopharyngeal Swab  Result Value Ref Range Status   SARS Coronavirus 2 NEGATIVE NEGATIVE Final    Comment: (NOTE) SARS-CoV-2 target nucleic acids are NOT DETECTED. The SARS-CoV-2 RNA is generally detectable in upper and lower  respiratory specimens during the acute phase of infection. Negative results do not preclude SARS-CoV-2 infection, do not rule out co-infections with other pathogens, and should not be used as the sole  basis for treatment or other patient management decisions. Negative results must be combined with clinical observations, patient history, and epidemiological information. The expected result is Negative. Fact Sheet for Patients: HairSlick.no Fact Sheet for Healthcare Providers: quierodirigir.com This test is not yet approved or cleared by the [place] FDA and  has been authorized for detection and/or diagnosis of SARS-CoV-2 by FDA under an Emergency Use Authorization (EUA). This EUA will remain  in effect (meaning this test can be used) for the duration of the COVID-19 declaration under Section 56 4(b)(1) of the Act, 21 U.S.C. section 360bbb-3(b)(1), unless the authorization is terminated or revoked sooner. Performed at [facility], [address] [place], [place] [address]   Body fluid culture     Status: None   Collection Time: 05/18/19 10:21 AM   Specimen: Ankle; Body Fluid  Result Value Ref Range Status   Specimen Description   Final    ANKLE RIGHT Performed at [facility], [address] [place], [place] [address]    Special Requests   Final    Normal Performed at [facility], [address] [place], [place] [address]    Gram Stain   Final    RARE WBC PRESENT, PREDOMINANTLY PMN ABUNDANT GRAM POSITIVE COCCI Performed at [facility], [address], [place], [place] [address]    Culture   Final    ABUNDANT METHICILLIN RESISTANT STAPHYLOCOCCUS AUREUS   Report Status 05/21/2019 FINAL  Final   Organism ID, Bacteria METHICILLIN RESISTANT STAPHYLOCOCCUS AUREUS  Final      Susceptibility   Methicillin resistant staphylococcus aureus - MIC*    CIPROFLOXACIN >=8 RESISTANT Resistant     ERYTHROMYCIN >=8 RESISTANT Resistant     GENTAMICIN <=0.5 SENSITIVE Sensitive     OXACILLIN >=4 RESISTANT Resistant     TETRACYCLINE <=1 SENSITIVE Sensitive     VANCOMYCIN 1  SENSITIVE Sensitive     TRIMETH/SULFA 160 RESISTANT Resistant     CLINDAMYCIN >=8 RESISTANT Resistant     RIFAMPIN <=0.5 SENSITIVE Sensitive     Inducible Clindamycin NEGATIVE Sensitive     * ABUNDANT METHICILLIN RESISTANT STAPHYLOCOCCUS AUREUS     Time coordinating discharge:  35 minutes  SIGNED:   Dorcas Carrow, MD  [facility] 05/21/2019, 3:39 PM

## 2019-05-22 LAB — CULTURE, BLOOD (ROUTINE X 2)
Culture: NO GROWTH
Culture: NO GROWTH
Special Requests: ADEQUATE
Special Requests: ADEQUATE

## 2019-05-30 ENCOUNTER — Inpatient Hospital Stay: Payer: Self-pay | Admitting: Family Medicine

## 2019-06-14 ENCOUNTER — Emergency Department (HOSPITAL_BASED_OUTPATIENT_CLINIC_OR_DEPARTMENT_OTHER)
Admission: EM | Admit: 2019-06-14 | Discharge: 2019-06-14 | Disposition: A | Discharge status: Home / Self Care | Payer: Self-pay | Attending: Emergency Medicine | Admitting: Emergency Medicine

## 2019-06-14 ENCOUNTER — Encounter (HOSPITAL_BASED_OUTPATIENT_CLINIC_OR_DEPARTMENT_OTHER): Payer: Self-pay | Admitting: *Deleted

## 2019-06-14 ENCOUNTER — Other Ambulatory Visit: Payer: Self-pay

## 2019-06-14 DIAGNOSIS — K0889 Other specified disorders of teeth and supporting structures: Secondary | ICD-10-CM | POA: Insufficient documentation

## 2019-06-14 DIAGNOSIS — E119 Type 2 diabetes mellitus without complications: Secondary | ICD-10-CM | POA: Insufficient documentation

## 2019-06-14 DIAGNOSIS — F17210 Nicotine dependence, cigarettes, uncomplicated: Secondary | ICD-10-CM | POA: Insufficient documentation

## 2019-06-14 DIAGNOSIS — Z7984 Long term (current) use of oral hypoglycemic drugs: Secondary | ICD-10-CM | POA: Insufficient documentation

## 2019-06-14 DIAGNOSIS — Z79899 Other long term (current) drug therapy: Secondary | ICD-10-CM | POA: Insufficient documentation

## 2019-06-14 MED ORDER — CLINDAMYCIN HCL 300 MG PO CAPS: 300.0000 mg | ORAL_CAPSULE | Freq: Three times a day (TID) | ORAL | 0 refills | Status: AC

## 2019-06-14 MED FILL — CLINDAMYCIN HCL 300 MG CAPS: 300 | 10 days supply | Qty: 30 | Fill #0

## 2019-06-14 NOTE — ED Triage Notes (Signed)
To ED via EMS. Reported that he is having hallucinations. He was found running through the woods and found on someone's front porch.

## 2019-06-14 NOTE — ED Provider Notes (Signed)
[facility] EMERGENCY DEPARTMENT Provider Note   CSN: [id number] Arrival date & time: 06/14/19  1229     History   Chief Complaint Chief Complaint  Patient presents with  . Dental Pain    HPI Corey SkainsVincent Chicoine is a 53 y.o. male.     The history is provided by the patient.  Dental Pain Location:  Upper Quality:  Aching Severity:  Mild Onset quality:  Gradual Timing:  Intermittent Progression:  Waxing and waning Chronicity:  New Context: poor dentition   Relieved by:  Acetaminophen Worsened by:  Nothing Associated symptoms: no congestion, no difficulty swallowing, no drooling and no fever   Risk factors comment:  Meth use   Past Medical History:  Diagnosis Date  . Diabetes mellitus without complication ([facility])   . Drug abuse ([facility])   . Tobacco abuse     Patient Active Problem List   Diagnosis Date Noted  . Cellulitis of right foot 05/17/2019  . Diabetes mellitus without complication ([facility]) 05/17/2019  . Tobacco abuse   . Drug abuse [facility](H[facility])     Past Surgical History:  Procedure Laterality Date  . SKIN GRAFT          Home Medications    Prior to Admission medications   Medication Sig Start Date End Date Taking? Authorizing Provider  acetaminophen (TYLENOL) 500 MG tablet Take 2 tablets (1,000 mg total) by mouth every 6 (six) hours as needed for moderate pain (foot pain). 05/21/19   Dorcas CarrowGhimire, Kuber, MD  clindamycin (CLEOCIN) 300 MG capsule Take 1 capsule (300 mg total) by mouth 3 (three) times daily for 10 days. 06/14/19 06/24/19  Chrisha Vogel, DO  metFORMIN (GLUCOPHAGE) 1000 MG tablet Take 1 tablet (1,000 mg total) by mouth 2 (two) times daily. 05/21/19 06/20/19  Dorcas CarrowGhimire, Kuber, MD  tamsulosin (FLOMAX) 0.4 MG CAPS capsule Take 1 capsule (0.4 mg total) by mouth daily. 05/21/19 06/20/19  Dorcas CarrowGhimire, Kuber, MD    Family History Family History  Problem Relation Age of Onset  . Dementia Father   . Diabetes Mellitus II Brother     Social History Social  History   Tobacco Use  . Smoking status: Current Every Day Smoker    Types: Cigarettes  . Smokeless tobacco: Never Used  Substance Use Topics  . Alcohol use: Never    Frequency: Never  . Drug use: Yes    Types: Methamphetamines     Allergies   Patient has no known allergies.   Review of Systems Review of Systems  Constitutional: Negative for chills and fever.  HENT: Negative for congestion, drooling, ear pain and sore throat.   Eyes: Negative for pain and visual disturbance.  Respiratory: Negative for cough and shortness of breath.   Cardiovascular: Negative for chest pain and palpitations.  Gastrointestinal: Negative for abdominal pain and vomiting.  Genitourinary: Negative for dysuria and hematuria.  Musculoskeletal: Negative for arthralgias and back pain.  Skin: Negative for color change and rash.  Neurological: Negative for seizures and syncope.  Psychiatric/Behavioral: Negative for suicidal ideas.  All other systems reviewed and are negative.    Physical Exam Updated Vital Signs BP (!) 170/101   Pulse 70   Temp 97.7 F (36.5 C) (Oral)   Resp 20   Ht 6\' 1"  (1.854 m)   Wt 79.4 kg   SpO2 99%   BMI 23.09 kg/m   Physical Exam Vitals signs and nursing note reviewed.  Constitutional:      Appearance: He is well-developed.  HENT:  Head: Normocephalic and atraumatic.     Mouth/Throat:     Comments: Overall poor dentition, no obvious signs of infection or abscess Eyes:     Extraocular Movements: Extraocular movements intact.     Conjunctiva/sclera: Conjunctivae normal.     Pupils: Pupils are equal, round, and reactive to light.  Neck:     Musculoskeletal: Neck supple.  Cardiovascular:     Rate and Rhythm: Normal rate and regular rhythm.     Pulses: Normal pulses.     Heart sounds: Normal heart sounds. No murmur.  Pulmonary:     Effort: Pulmonary effort is normal. No respiratory distress.     Breath sounds: Normal breath sounds.  Abdominal:      Palpations: Abdomen is soft.     Tenderness: There is no abdominal tenderness.  Skin:    General: Skin is warm and dry.  Neurological:     Mental Status: He is alert.  Psychiatric:        Attention and Perception: Attention normal.        Speech: Speech is tangential.        Behavior: Behavior normal. Behavior is not agitated.        Thought Content: Thought content normal. Thought content does not include homicidal or suicidal ideation. Thought content does not include homicidal plan.      ED Treatments / Results  Labs (all labs ordered are listed, but only abnormal results are displayed) Labs Reviewed - No data to display  EKG None  Radiology No results found.  Procedures Procedures (including critical care time)  Medications Ordered in ED Medications - No data to display   Initial Impression / Assessment and Plan / ED Course  I have reviewed the triage vital signs and the nursing notes.  Pertinent labs & imaging results that were available during my care of the patient were reviewed by me and considered in my medical decision making (see chart for details).     Shavar Gorka is a 53 year old male with no significant medical history who presents to the ED with dental pain.  Brought in by EMS as he showed up on someone's doorstep.  He seemed to do having some bizarre thoughts and behavior.  He has a history of meth use.  Denies any alcohol or drug use.  He overall appears well.  Neurologically he is intact.  He is mostly concerned about upper tooth pain.  He has poor dentition on exam.  Will treat with antibiotics.  He denies any suicidal homicidal ideation.  He has some tangential speech but overall he is ambulatory without any issues.  Likely has some bizarre thought secondary to either chronic drug use or baseline.  Does not appear to be at a harm for himself or others.  He is very easy to talk to in the room.  He was able to eat and drink without any issues.  Patient  appears clinically sober.  He denies suicidal homicidal ideation.  Given resources for dental care and discharged from ED in good condition.  Given return precautions.  This chart was dictated using voice recognition software.  Despite best efforts to proofread,  errors can occur which can change the documentation meaning.     Final Clinical Impressions(s) / ED Diagnoses   Final diagnoses:  Pain, dental    ED Discharge Orders         Ordered    clindamycin (CLEOCIN) 300 MG capsule  3 times daily  06/14/19 1345           Virgina Norfolk, DO 06/14/19 1345

## 2019-06-14 NOTE — ED Notes (Signed)
Calm, cooperative, given snack and soda. Call placed to wife, and message left to call nurse, as requested for possible ride home

## 2019-06-14 NOTE — ED Notes (Signed)
ED Provider at bedside. 

## 2019-08-21 ENCOUNTER — Emergency Department (HOSPITAL_BASED_OUTPATIENT_CLINIC_OR_DEPARTMENT_OTHER): Payer: Self-pay

## 2019-08-21 ENCOUNTER — Emergency Department (HOSPITAL_BASED_OUTPATIENT_CLINIC_OR_DEPARTMENT_OTHER)
Admission: EM | Admit: 2019-08-21 | Discharge: 2019-08-22 | Disposition: A | Discharge status: Home / Self Care | Payer: Self-pay | Attending: Emergency Medicine | Admitting: Emergency Medicine

## 2019-08-21 ENCOUNTER — Other Ambulatory Visit: Payer: Self-pay

## 2019-08-21 ENCOUNTER — Encounter (HOSPITAL_BASED_OUTPATIENT_CLINIC_OR_DEPARTMENT_OTHER): Payer: Self-pay | Admitting: *Deleted

## 2019-08-21 DIAGNOSIS — Z79899 Other long term (current) drug therapy: Secondary | ICD-10-CM | POA: Insufficient documentation

## 2019-08-21 DIAGNOSIS — R7401 Elevation of levels of liver transaminase levels: Secondary | ICD-10-CM

## 2019-08-21 DIAGNOSIS — E119 Type 2 diabetes mellitus without complications: Secondary | ICD-10-CM | POA: Insufficient documentation

## 2019-08-21 DIAGNOSIS — Z794 Long term (current) use of insulin: Secondary | ICD-10-CM | POA: Insufficient documentation

## 2019-08-21 DIAGNOSIS — B159 Hepatitis A without hepatic coma: Secondary | ICD-10-CM

## 2019-08-21 DIAGNOSIS — F17210 Nicotine dependence, cigarettes, uncomplicated: Secondary | ICD-10-CM | POA: Insufficient documentation

## 2019-08-21 DIAGNOSIS — Z20822 Contact with and (suspected) exposure to covid-19: Secondary | ICD-10-CM | POA: Insufficient documentation

## 2019-08-21 LAB — ETHANOL: Alcohol, Ethyl (B): <10 mg/dL (ref <10)

## 2019-08-21 LAB — URINALYSIS, ROUTINE W REFLEX MICROSCOPIC
Glucose, UA: 250 mg/dL — AB
Hgb urine dipstick: NEGATIVE
Ketones, ur: 15 mg/dL — AB
Leukocytes,Ua: NEGATIVE
Nitrite: NEGATIVE
Protein, ur: NEGATIVE mg/dL
Specific Gravity, Urine: 1.025 (ref 1.005–1.030)
pH: 6.5 (ref 5.0–8.0)

## 2019-08-21 LAB — COMPREHENSIVE METABOLIC PANEL
ALT: 1411 U/L — ABNORMAL HIGH (ref 0–44)
AST: 517 U/L — ABNORMAL HIGH (ref 15–41)
Albumin: 3 g/dL — ABNORMAL LOW (ref 3.5–5.0)
Alkaline Phosphatase: 260 U/L — ABNORMAL HIGH (ref 38–126)
Anion gap: 9 (ref 5–15)
BUN: 9 mg/dL (ref 6–20)
CO2: 23 mmol/L (ref 22–32)
Calcium: 8.4 mg/dL — ABNORMAL LOW (ref 8.9–10.3)
Chloride: 101 mmol/L (ref 98–111)
Creatinine, Ser: 0.77 mg/dL (ref 0.61–1.24)
GFR calc Af Amer: >60 mL/min (ref >60)
GFR calc non Af Amer: >60 mL/min (ref >60)
Glucose, Bld: 282 mg/dL — ABNORMAL HIGH (ref 70–99)
Potassium: 3.7 mmol/L (ref 3.5–5.1)
Sodium: 133 mmol/L — ABNORMAL LOW (ref 135–145)
Total Bilirubin: 6.9 mg/dL — ABNORMAL HIGH (ref 0.3–1.2)
Total Protein: 6.5 g/dL (ref 6.5–8.1)

## 2019-08-21 LAB — CBC WITH DIFFERENTIAL/PLATELET
Abs Immature Granulocytes: 0.02 10*3/uL (ref 0.00–0.07)
Basophils Absolute: 0 10*3/uL (ref 0.0–0.1)
Basophils Relative: 1 %
Eosinophils Absolute: 0.1 10*3/uL (ref 0.0–0.5)
Eosinophils Relative: 1 %
HCT: 41.6 % (ref 39.0–52.0)
Hemoglobin: 13.7 g/dL (ref 13.0–17.0)
Immature Granulocytes: 1 %
Lymphocytes Relative: 43 %
Lymphs Abs: 1.7 10*3/uL (ref 0.7–4.0)
MCH: 28.8 pg (ref 26.0–34.0)
MCHC: 32.9 g/dL (ref 30.0–36.0)
MCV: 87.4 fL (ref 80.0–100.0)
Monocytes Absolute: 0.3 10*3/uL (ref 0.1–1.0)
Monocytes Relative: 8 %
Neutro Abs: 1.9 10*3/uL (ref 1.7–7.7)
Neutrophils Relative %: 46 %
Platelets: 233 10*3/uL (ref 150–400)
RBC: 4.76 MIL/uL (ref 4.22–5.81)
RDW: 14.2 % (ref 11.5–15.5)
WBC: 4 10*3/uL (ref 4.0–10.5)
nRBC: 0 % (ref 0.0–0.2)

## 2019-08-21 LAB — PROTIME-INR
INR: 0.9 (ref 0.8–1.2)
Prothrombin Time: 12.1 seconds (ref 11.4–15.2)

## 2019-08-21 LAB — CBG MONITORING, ED
Glucose-Capillary: 247 mg/dL — ABNORMAL HIGH (ref 70–99)
Glucose-Capillary: 164 mg/dL — ABNORMAL HIGH (ref 70–99)

## 2019-08-21 LAB — SARS CORONAVIRUS 2 AG (30 MIN TAT): SARS Coronavirus 2 Ag: NEGATIVE

## 2019-08-21 LAB — AMMONIA: Ammonia: 50 umol/L — ABNORMAL HIGH (ref 9–35)

## 2019-08-21 LAB — ACETAMINOPHEN LEVEL: Acetaminophen (Tylenol), Serum: <10 ug/mL — ABNORMAL LOW (ref 10–30)

## 2019-08-21 MED ORDER — SODIUM CHLORIDE 0.9 % IV BOLUS
1000.0000 mL | Freq: Once | INTRAVENOUS | Status: AC
  Administered 2019-08-22: 1000 mL via INTRAVENOUS

## 2019-08-21 MED ORDER — IOHEXOL 300 MG/ML  SOLN
100.0000 mL | Freq: Once | INTRAMUSCULAR | Status: AC
  Administered 2019-08-21: 100 mL via INTRAVENOUS

## 2019-08-21 NOTE — ED Triage Notes (Addendum)
Pt c/o increased blood sugar , increased fatigue x 2 months , noticed in triage , pts skin and eyes are  slightly yellow ,

## 2019-08-21 NOTE — ED Notes (Signed)
Pt has now returned to lobby.

## 2019-08-21 NOTE — ED Notes (Signed)
PT states out of medicines for 6 months.

## 2019-08-21 NOTE — ED Provider Notes (Signed)
[facility] EMERGENCY DEPARTMENT Provider Note  CSN: [id number] Arrival date & time: 08/21/19 1741  Chief Complaint(s) Hyperglycemia and Abdominal Pain  HPI Tanner [name] is a 54 y.o. male   The history is provided by the patient.  Abdominal Pain Pain location:  Epigastric Pain quality: dull   Pain severity:  Mild Onset quality:  Gradual Duration:  2 weeks Timing:  Constant Progression:  Waxing and waning Chronicity:  New Context: not alcohol use, not recent illness, not sick contacts and not suspicious food intake   Relieved by:  Nothing Worsened by:  Nothing Associated symptoms: diarrhea and fatigue   Associated symptoms: no chest pain, no constipation, no fever, no melena, no nausea and no vomiting   Associated symptoms comment:  Dark urine   IVDU, Meth No alcohol    Past Medical History Past Medical History:  Diagnosis Date  . Diabetes mellitus without complication (HCC)   . Drug abuse (HCC)   . Tobacco abuse    Patient Active Problem List   Diagnosis Date Noted  . Cellulitis of right foot 05/17/2019  . Diabetes mellitus without complication (HCC) 05/17/2019  . Tobacco abuse   . Drug abuse (HCC)    Home Medication(s) Prior to Admission medications   Medication Sig Start Date End Date Taking? Authorizing Provider  gabapentin (NEURONTIN) 300 MG capsule Take 300 mg by mouth 3 (three) times daily.   Yes [provider]  insulin glargine (LANTUS) 100 UNIT/ML injection Inject into the skin daily.   Yes [provider]  QUEtiapine (SEROQUEL) 100 MG tablet Take 100 mg by mouth at bedtime.   Yes [provider]  traZODone (DESYREL) 150 MG tablet Take by mouth at bedtime.   Yes [provider]  acetaminophen (TYLENOL) 500 MG tablet Take 2 tablets (1,000 mg total) by mouth every 6 (six) hours as needed for moderate pain (foot pain). 05/21/19   Dorcas Carrow, MD  metFORMIN (GLUCOPHAGE) 1000 MG tablet Take 1 tablet (1,000 mg  total) by mouth 2 (two) times daily. 05/21/19 06/20/19  Dorcas Carrow, MD                                                                                                                                    Past Surgical History Past Surgical History:  Procedure Laterality Date  . SKIN GRAFT     Family History Family History  Problem Relation Age of Onset  . Dementia Father   . Diabetes Mellitus II Brother     Social History Social History   Tobacco Use  . Smoking status: Current Every Day Smoker    Types: Cigarettes  . Smokeless tobacco: Never Used  Substance Use Topics  . Alcohol use: Never  . Drug use: Yes    Types: Methamphetamines   Allergies Patient has no known allergies.  Review of Systems Review of Systems  Constitutional: Positive for fatigue. Negative for fever.  Cardiovascular: Negative for chest pain.  Gastrointestinal: Positive for abdominal pain and diarrhea. Negative for constipation, melena, nausea and vomiting.   All other systems are reviewed and are negative for acute change except as noted in the HPI  Physical Exam Vital Signs  I have reviewed the triage vital signs BP 140/79 (BP Location: Right Arm)   Pulse 60   Temp 98.5 F (36.9 C)   Resp 16   Ht 6\' 1"  (1.854 m)   SpO2 100%   BMI 23.09 kg/m   Physical Exam Vitals reviewed.  Constitutional:      General: He is not in acute distress.    Appearance: He is well-developed. He is not diaphoretic.  HENT:     Head: Normocephalic and atraumatic.     Jaw: No trismus.     Right Ear: External ear normal.     Left Ear: External ear normal.     Nose: Nose normal.  Eyes:     General: Scleral icterus (mild) present.     Conjunctiva/sclera: Conjunctivae normal.  Neck:     Trachea: Phonation normal.  Cardiovascular:     Rate and Rhythm: Normal rate and regular rhythm.  Pulmonary:     Effort: Pulmonary effort is normal. No respiratory distress.     Breath sounds: No stridor.  Abdominal:      General: There is no distension.     Tenderness: There is no abdominal tenderness.  Musculoskeletal:        General: Normal range of motion.     Cervical back: Normal range of motion.  Skin:    Coloration: Skin is jaundiced (to upper torso).  Neurological:     Mental Status: He is alert and oriented to person, place, and time.  Psychiatric:        Behavior: Behavior normal.     ED Results and Treatments Labs (all labs ordered are listed, but only abnormal results are displayed) Labs Reviewed  COMPREHENSIVE METABOLIC PANEL - Abnormal; Notable for the following components:      Result Value   Sodium 133 (*)    Glucose, Bld 282 (*)    Calcium 8.4 (*)    Albumin 3.0 (*)    AST 517 (*)    ALT 1,411 (*)    Alkaline Phosphatase 260 (*)    Total Bilirubin 6.9 (*)    All other components within normal limits  HEPATITIS PANEL, ACUTE - Abnormal; Notable for the following components:   HCV Ab Reactive (*)    Hep A IgM Reactive (*)    All other components within normal limits  AMMONIA - Abnormal; Notable for the following components:   Ammonia 50 (*)    All other components within normal limits  URINALYSIS, ROUTINE W REFLEX MICROSCOPIC - Abnormal; Notable for the following components:   Glucose, UA 250 (*)    Bilirubin Urine LARGE (*)    Ketones, ur 15 (*)    All other components within normal limits  ACETAMINOPHEN LEVEL - Abnormal; Notable for the following components:   Acetaminophen (Tylenol), Serum <10 (*)    All other components within normal limits  COMPREHENSIVE METABOLIC PANEL - Abnormal; Notable for the following components:   Sodium 133 (*)    Potassium 3.3 (*)    Glucose, Bld 236 (*)    Calcium 8.0 (*)    Total Protein 5.7 (*)    Albumin 2.6 (*)    AST 331 (*)    ALT 1,052 (*)  Alkaline Phosphatase 245 (*)    Total Bilirubin 6.2 (*)    All other components within normal limits  CBG MONITORING, ED - Abnormal; Notable for the following components:    Glucose-Capillary 247 (*)    All other components within normal limits  CBG MONITORING, ED - Abnormal; Notable for the following components:   Glucose-Capillary 164 (*)    All other components within normal limits  SARS CORONAVIRUS 2 AG (30 MIN TAT)  CBC WITH DIFFERENTIAL/PLATELET  PROTIME-INR  ETHANOL                                                                                                                         EKG  EKG Interpretation  Date/Time:    Ventricular Rate:    PR Interval:    QRS Duration:   QT Interval:    QTC Calculation:   R Axis:     Text Interpretation:        Radiology DG Chest 2 View  Result Date: 08/21/2019 CLINICAL DATA:  Jaundice EXAM: CHEST - 2 VIEW COMPARISON:  None. FINDINGS: The heart size and mediastinal contours are within normal limits. Both lungs are clear. No pleural effusion. The visualized skeletal structures are unremarkable. IMPRESSION: No acute process in the chest. Electronically Signed   By: Guadlupe Spanish M.D.   On: 08/21/2019 18:36   CT ABDOMEN PELVIS W CONTRAST  Result Date: 08/21/2019 CLINICAL DATA:  Acute abdominal pain and mild jaundice EXAM: CT ABDOMEN AND PELVIS WITH CONTRAST TECHNIQUE: Multidetector CT imaging of the abdomen and pelvis was performed using the standard protocol following bolus administration of intravenous contrast. CONTRAST:  OMNIPAQUE IOHEXOL 300 MG/ML  SOLN COMPARISON:  None. FINDINGS: Lower chest: No acute abnormality. Hepatobiliary: Gallbladder is decompressed. Liver demonstrates some mild periportal edema. No biliary ductal dilatation is noted. Pancreas: Unremarkable. No pancreatic ductal dilatation or surrounding inflammatory changes. Spleen: Normal in size without focal abnormality. Adrenals/Urinary Tract: Adrenal glands are within normal limits. Kidneys demonstrate a normal enhancement pattern bilaterally. Delayed images demonstrate normal excretion of contrast. The bladder is decompressed.  Stomach/Bowel: No obstructive or inflammatory changes of the colon are seen. Mild retained fecal material is noted within the colon. The appendix is within normal limits. Small bowel and stomach show no acute abnormality. Vascular/Lymphatic: Aortic atherosclerosis. No enlarged abdominal or pelvic lymph nodes. Reproductive: Prostate is unremarkable. Other: No abdominal wall hernia or abnormality. No abdominopelvic ascites. Musculoskeletal: No acute or significant osseous findings. IMPRESSION: Mild changes of constipation. Hepatic periportal edema likely related to a degree of underlying hepatitis. No other focal abnormality is noted. Electronically Signed   By: Alcide Clever M.D.   On: 08/21/2019 19:23    Pertinent labs & imaging results that were available during my care of the patient were reviewed by me and considered in my medical decision making (see chart for details).  Medications Ordered in ED Medications  iohexol (OMNIPAQUE) 300 MG/ML solution 100 mL (100 mLs Intravenous Contrast Given 08/21/19 1902)  sodium chloride 0.9 % bolus 1,000 mL (0 mLs Intravenous Stopped 08/22/19 0105)                                                                                                                                    Procedures Procedures  (including critical care time)  Medical Decision Making / ED Course I have reviewed the nursing notes for this encounter and the patient's prior records (if available in EHR or on provided paperwork).   Tanner [name] was evaluated in Emergency Department on 08/22/2019 for the symptoms described in the history of present illness. He was evaluated in the context of the global COVID-19 pandemic, which necessitated consideration that the patient might be at risk for infection with the SARS-CoV-2 virus that causes COVID-19. Institutional protocols and algorithms that pertain to the evaluation of patients at risk for COVID-19 are in a state of rapid change based on information  released by regulatory bodies including the CDC and federal and state organizations. These policies and algorithms were followed during the patient's care in the ED.  Work consistent with hepatitis. Appears to be Hep A. Patient also has reactive HCV, uncertain of chronicity.  Patient has hyperglycemia w/o DKA or HHS.   Patient has reassuring CT w/o other acute process.   Tolerating oral intake.   Plan to repeat CMP at 8 hr mark and determine progression and dispo.  6:09 AM Repeat LFTs improving.  Feel patient is appropriate for discharge with close follow-up.  Will recommend patient follow-up at [facility] for repeat labs and further work-up of possible hep C.      Final Clinical Impression(s) / ED Diagnoses Final diagnoses:  Viral hepatitis A without hepatic coma  Transaminitis  Hyperbilirubinemia     The patient appears reasonably screened and/or stabilized for discharge and I doubt any other medical condition or other [facility] requiring further screening, evaluation, or treatment in the ED at this time prior to discharge.  Disposition: Discharge  Condition: Good  I have discussed the results, Dx and Tx plan with the patient who expressed understanding and agree(s) with the plan. Discharge instructions discussed at great length. The patient was given strict return precautions who verbalized understanding of the instructions. No further questions at time of discharge.    ED Discharge Orders    None       Follow Up: [place] [id number] [phone] Schedule an appointment as soon as possible for a visit  in 3-5 days, for close follow up to assess liver enzymes and additional Hep C work up.     This chart was dictated using voice recognition software.  Despite best efforts to proofread,  errors can occur which can change the documentation meaning.   Fatima Blank,  MD 08/22/19 [phone]

## 2019-08-21 NOTE — ED Notes (Signed)
Patient transported to CT 

## 2019-08-21 NOTE — ED Notes (Signed)
Patient transported to X-ray 

## 2019-08-22 LAB — HEPATITIS PANEL, ACUTE
HCV Ab: REACTIVE — AB
Hep A IgM: REACTIVE — AB
Hep B C IgM: NONREACTIVE
Hepatitis B Surface Ag: NONREACTIVE

## 2019-08-22 LAB — COMPREHENSIVE METABOLIC PANEL
ALT: 1052 U/L — ABNORMAL HIGH (ref 0–44)
AST: 331 U/L — ABNORMAL HIGH (ref 15–41)
Albumin: 2.6 g/dL — ABNORMAL LOW (ref 3.5–5.0)
Alkaline Phosphatase: 245 U/L — ABNORMAL HIGH (ref 38–126)
Anion gap: 5 (ref 5–15)
BUN: 9 mg/dL (ref 6–20)
CO2: 27 mmol/L (ref 22–32)
Calcium: 8 mg/dL — ABNORMAL LOW (ref 8.9–10.3)
Chloride: 101 mmol/L (ref 98–111)
Creatinine, Ser: 0.64 mg/dL (ref 0.61–1.24)
GFR calc Af Amer: >60 mL/min (ref >60)
GFR calc non Af Amer: >60 mL/min (ref >60)
Glucose, Bld: 236 mg/dL — ABNORMAL HIGH (ref 70–99)
Potassium: 3.3 mmol/L — ABNORMAL LOW (ref 3.5–5.1)
Sodium: 133 mmol/L — ABNORMAL LOW (ref 135–145)
Total Bilirubin: 6.2 mg/dL — ABNORMAL HIGH (ref 0.3–1.2)
Total Protein: 5.7 g/dL — ABNORMAL LOW (ref 6.5–8.1)

## 2019-08-22 MED ORDER — METFORMIN HCL 1000 MG PO TABS: 1000.0000 mg | ORAL_TABLET | Freq: Two times a day (BID) | ORAL | 0 refills | Status: DC

## 2019-08-22 NOTE — ED Notes (Signed)
Pt tolerated po fluids and crackers. No emesis.

## 2019-09-23 ENCOUNTER — Encounter (HOSPITAL_COMMUNITY): Payer: Self-pay | Admitting: Emergency Medicine

## 2019-09-23 ENCOUNTER — Emergency Department (HOSPITAL_COMMUNITY)
Admission: EM | Admit: 2019-09-23 | Discharge: 2019-09-24 | Disposition: A | Discharge status: Home / Self Care | Payer: Self-pay | Attending: Emergency Medicine | Admitting: Emergency Medicine

## 2019-09-23 DIAGNOSIS — F17210 Nicotine dependence, cigarettes, uncomplicated: Secondary | ICD-10-CM | POA: Insufficient documentation

## 2019-09-23 DIAGNOSIS — E119 Type 2 diabetes mellitus without complications: Secondary | ICD-10-CM | POA: Insufficient documentation

## 2019-09-23 DIAGNOSIS — T401X1A Poisoning by heroin, accidental (unintentional), initial encounter: Secondary | ICD-10-CM | POA: Insufficient documentation

## 2019-09-23 LAB — I-STAT CHEM 8, ED
BUN: 19 mg/dL (ref 6–20)
Calcium, Ion: 1.12 mmol/L — ABNORMAL LOW (ref 1.15–1.40)
Chloride: 99 mmol/L (ref 98–111)
Creatinine, Ser: 0.8 mg/dL (ref 0.61–1.24)
Glucose, Bld: 194 mg/dL — ABNORMAL HIGH (ref 70–99)
HCT: 35 % — ABNORMAL LOW (ref 39.0–52.0)
Hemoglobin: 11.9 g/dL — ABNORMAL LOW (ref 13.0–17.0)
Potassium: 3.6 mmol/L (ref 3.5–5.1)
Sodium: 136 mmol/L (ref 135–145)
TCO2: 26 mmol/L (ref 22–32)

## 2019-09-23 NOTE — ED Triage Notes (Signed)
BIB EMS from place of residence. Reports he injected heroin and handed his family 1mg  Narcan before to administer to him incase. Pt stopped breathing so family did administer Narcan. Fire arrived and administered another 1mg  Narcan IM. They continued with rescue breaths for 5 minutes. Pt presents A/XO4

## 2019-09-23 NOTE — ED Provider Notes (Signed)
Emergency Department Provider Note   I have reviewed the triage vital signs and the nursing notes.   HISTORY  Chief Complaint Drug Overdose   HPI Tanner [name] is a 54 y.o. male who presents the emerge department today for drug overdose.  Patient normally uses methamphetamines but tonight decided try heroin.  He had Narcan available and given to his family members.  He did that heroin and subsequently became apneic they gave him Narcan which improved some but they called 911 and fire got there and provided rescue breathing and gave him another dose of Narcan and he is now back to his normal.  No other drugs.  Does smoke.  No trauma.   No other associated or modifying symptoms.    Past Medical History:  Diagnosis Date  . Diabetes mellitus without complication ([place])   . Drug abuse ([place])   . Tobacco abuse     Patient Active Problem List   Diagnosis Date Noted  . Cellulitis of right foot 05/17/2019  . Diabetes mellitus without complication ([place]) 57/84/6962  . Tobacco abuse   . Drug abuse [facility])     Past Surgical History:  Procedure Laterality Date  . SKIN GRAFT      Current Outpatient Rx  . Order #: [id number] Class: Print  . Order #: [id number] Class: Historical Med  . Order #: [id number] Class: Print  . Order #: [id number] Class: Print    Allergies Patient has no known allergies.  Family History  Problem Relation Age of Onset  . Dementia Father   . Diabetes Mellitus II Brother     Social History Social History   Tobacco Use  . Smoking status: Current Every Day Smoker    Types: Cigarettes  . Smokeless tobacco: Never Used  Substance Use Topics  . Alcohol use: Never  . Drug use: Yes    Types: Methamphetamines, IV    Review of Systems  All other systems negative except as documented in the HPI. All pertinent positives and negatives as reviewed in the HPI. ____________________________________________   PHYSICAL EXAM:  VITAL SIGNS: ED Triage Vitals  Enc  Vitals Group     BP 09/23/19 2316 (!) 147/87     Pulse Rate 09/23/19 2316 71     Resp 09/23/19 2316 12     Temp 09/23/19 2316 (!) 97.5 F (36.4 C)     Temp Source 09/23/19 2316 Temporal     SpO2 09/23/19 2316 99 %     Weight 09/23/19 2319 160 lb (72.6 kg)     Height 09/23/19 2319 5\' 6"  (1.676 m)    Constitutional: Alert and oriented. Well appearing and in no acute distress. Eyes: Conjunctivae are normal. PERRL. EOMI. Head: Atraumatic. Nose: No congestion/rhinnorhea. Mouth/Throat: Mucous membranes are moist.  Oropharynx non-erythematous. Neck: No stridor.  No meningeal signs.   Cardiovascular: Normal rate, regular rhythm. Good peripheral circulation. Grossly normal heart sounds.   Respiratory: Normal respiratory effort.  No retractions. Lungs CTAB. Gastrointestinal: Soft and nontender. No distention.  Musculoskeletal: No lower extremity tenderness nor edema. No gross deformities of extremities. Neurologic:  Normal speech and language. No gross focal neurologic deficits are appreciated.  Skin:  Skin is warm, dry and intact. No rash noted.  ____________________________________________   LABS (all labs ordered are listed, but only abnormal results are displayed)  Labs Reviewed  I-STAT CHEM 8, ED - Abnormal; Notable for the following components:      Result Value   Glucose, Bld 194 (*)    Calcium, Ion 1.12 (*)  Hemoglobin 11.9 (*)    HCT 35.0 (*)    All other components within normal limits   ____________________________________________  EKG   EKG Interpretation  Date/Time:  Sunday September 23 2019 23:17:21 EST Ventricular Rate:  66 PR Interval:    QRS Duration: 88 QT Interval:  398 QTC Calculation: 417 R Axis:   40 Text Interpretation: Sinus rhythm Atrial premature complex Baseline wander in lead(s) V1 Sinus arrhythmia Confirmed by Marily Memos [phone]) on 09/23/2019 11:40:08 PM       ____________________________________________   INITIAL IMPRESSION /  ASSESSMENT AND PLAN / ED COURSE  Patient is definitely well-appearing.  Will observe for couple hours status post Narcan to ensure no long-acting opiates on board.  Patient continuously stable on monitor. Tolerating PO. Stable for dc. Narcan rx refilled.  Pertinent labs & imaging results that were available during my care of the patient were reviewed by me and considered in my medical decision making (see chart for details).  A medical screening exam was performed and I feel the patient has had an appropriate workup for their chief complaint at this time and likelihood of emergent condition existing is low. They have been counseled on decision, discharge, follow up and which symptoms necessitate immediate return to the emergency department. They or their family verbally stated understanding and agreement with plan and discharged in stable condition.   ____________________________________________  FINAL CLINICAL IMPRESSION(S) / ED DIAGNOSES  Final diagnoses:  Accidental overdose of heroin, initial encounter (HCC)     MEDICATIONS GIVEN DURING THIS VISIT:  Medications - No data to display   NEW OUTPATIENT MEDICATIONS STARTED DURING THIS VISIT:  Discharge Medication List as of 09/24/2019  2:18 AM    START taking these medications   Details  naloxone (NARCAN) 0.4 MG/ML injection Use per directions, Print        Note:  This note was prepared with assistance of Dragon voice recognition software. Occasional wrong-word or sound-a-like substitutions may have occurred due to the inherent limitations of voice recognition software.   Broughton Eppinger, Barbara Cower, MD 09/24/19 [phone]

## 2019-09-24 MED ORDER — NALOXONE HCL 0.4 MG/ML IJ SOLN: INTRAMUSCULAR | 1 refills | Status: DC

## 2019-09-24 NOTE — ED Notes (Signed)
Pt ambulated in hallway with steady gait.

## 2021-01-17 ENCOUNTER — Encounter (HOSPITAL_BASED_OUTPATIENT_CLINIC_OR_DEPARTMENT_OTHER): Payer: Self-pay | Admitting: *Deleted

## 2021-01-17 ENCOUNTER — Other Ambulatory Visit: Payer: Self-pay

## 2021-01-17 ENCOUNTER — Emergency Department (HOSPITAL_BASED_OUTPATIENT_CLINIC_OR_DEPARTMENT_OTHER)
Admission: EM | Admit: 2021-01-17 | Discharge: 2021-01-17 | Disposition: A | Discharge status: Home / Self Care | Payer: Self-pay | Attending: Emergency Medicine | Admitting: Emergency Medicine

## 2021-01-17 DIAGNOSIS — E119 Type 2 diabetes mellitus without complications: Secondary | ICD-10-CM | POA: Insufficient documentation

## 2021-01-17 DIAGNOSIS — Z7984 Long term (current) use of oral hypoglycemic drugs: Secondary | ICD-10-CM | POA: Insufficient documentation

## 2021-01-17 DIAGNOSIS — W57XXXA Bitten or stung by nonvenomous insect and other nonvenomous arthropods, initial encounter: Secondary | ICD-10-CM

## 2021-01-17 DIAGNOSIS — S50861A Insect bite (nonvenomous) of right forearm, initial encounter: Secondary | ICD-10-CM | POA: Insufficient documentation

## 2021-01-17 DIAGNOSIS — S50862A Insect bite (nonvenomous) of left forearm, initial encounter: Secondary | ICD-10-CM | POA: Insufficient documentation

## 2021-01-17 DIAGNOSIS — F17210 Nicotine dependence, cigarettes, uncomplicated: Secondary | ICD-10-CM | POA: Insufficient documentation

## 2021-01-17 MED ORDER — CEPHALEXIN 500 MG PO CAPS: 500.0000 mg | ORAL_CAPSULE | Freq: Four times a day (QID) | ORAL | 0 refills | Status: DC

## 2021-01-17 MED ORDER — CEPHALEXIN 250 MG PO CAPS
500.0000 mg | ORAL_CAPSULE | Freq: Once | ORAL | Status: AC
  Administered 2021-01-17: 500 mg via ORAL
  Filled 2021-01-17: qty 2

## 2021-01-17 NOTE — ED Provider Notes (Signed)
[facility] EMERGENCY DEPARTMENT Provider Note   CSN: [id number] Arrival date & time: 01/17/21  0002     History Chief Complaint  Patient presents with  . skin lesions    Tanner [name] is a 55 y.o. male.  Patient is a 55 year old male with history of diabetes, tobacco abuse, substance abuse.  Patient presenting today for evaluation of lesions to his forearms.  He reports being in the woods and believes he may have ringworm.  He denies any fevers or chills.  He denies any specific insect bites.  The history is provided by the patient.       Past Medical History:  Diagnosis Date  . Diabetes mellitus without complication (HCC)   . Drug abuse (HCC)   . Tobacco abuse     Patient Active Problem List   Diagnosis Date Noted  . Cellulitis of right foot 05/17/2019  . Diabetes mellitus without complication (HCC) 05/17/2019  . Tobacco abuse   . Drug abuse [facility])     Past Surgical History:  Procedure Laterality Date  . SKIN GRAFT         Family History  Problem Relation Age of Onset  . Dementia Father   . Diabetes Mellitus II Brother     Social History   Tobacco Use  . Smoking status: Current Every Day Smoker    Types: Cigarettes  . Smokeless tobacco: Never Used  Substance Use Topics  . Alcohol use: Never  . Drug use: Yes    Types: Methamphetamines, IV    Home Medications Prior to Admission medications   Medication Sig Start Date End Date Taking? Authorizing Provider  acetaminophen (TYLENOL) 500 MG tablet Take 2 tablets (1,000 mg total) by mouth every 6 (six) hours as needed for moderate pain (foot pain). Patient not taking: Reported on 09/24/2019 05/21/19   Dorcas Carrow, MD  metFORMIN (GLUCOPHAGE) 1000 MG tablet Take 1 tablet (1,000 mg total) by mouth 2 (two) times daily. 08/22/19 09/23/28  Nira Conn, MD  naloxone [facility]) 0.4 MG/ML injection Use per directions 09/24/19   Mesner, Barbara Cower, MD  tamsulosin (FLOMAX) 0.4 MG CAPS capsule Take 0.4 mg by  mouth daily. 03/18/19   [provider]    Allergies    Patient has no known allergies.  Review of Systems   Review of Systems  All other systems reviewed and are negative.   Physical Exam Updated Vital Signs BP (!) 149/90   Pulse 72   Temp 97.9 F (36.6 C) (Oral)   Resp 16   Ht 6' (1.829 m)   Wt 74.8 kg   SpO2 100%   BMI 22.38 kg/m   Physical Exam Vitals and nursing note reviewed.  Constitutional:      General: He is not in acute distress.    Appearance: Normal appearance. He is not ill-appearing.  HENT:     Head: Normocephalic and atraumatic.  Pulmonary:     Effort: Pulmonary effort is normal.  Skin:    General: Skin is warm and dry.     Comments: There are multiple lesions to both forearms.  There is a central area of scabbing with surrounding erythema.  These appear to be insect bites.  Neurological:     Mental Status: He is alert.     ED Results / Procedures / Treatments   Labs (all labs ordered are listed, but only abnormal results are displayed) Labs Reviewed - No data to display  EKG None  Radiology No results found.  Procedures Procedures   Medications Ordered in ED Medications - No data to display  ED Course  I have reviewed the triage vital signs and the nursing notes.  Pertinent labs & imaging results that were available during my care of the patient were reviewed by me and considered in my medical decision making (see chart for details).    MDM Rules/Calculators/A&P  Patient presenting with what appears to be chigger bites to me.  Several could potentially be in the early stages of cellulitis.  Patient will be treated with Keflex and as needed return.  Final Clinical Impression(s) / ED Diagnoses Final diagnoses:  None    Rx / DC Orders ED Discharge Orders    None       Geoffery Lyons, MD 01/17/21 [phone]

## 2021-01-17 NOTE — ED Triage Notes (Signed)
C/o several " ring worms spots" to bil arms

## 2021-01-17 NOTE — Discharge Instructions (Signed)
Begin taking Keflex as prescribed.  Local wound care with bacitracin twice daily.  Follow-up with your primary doctor if symptoms or not improving in the next few days.

## 2021-01-19 ENCOUNTER — Other Ambulatory Visit (HOSPITAL_BASED_OUTPATIENT_CLINIC_OR_DEPARTMENT_OTHER): Payer: Self-pay

## 2021-04-25 ENCOUNTER — Other Ambulatory Visit: Payer: Self-pay

## 2021-04-25 ENCOUNTER — Encounter (HOSPITAL_BASED_OUTPATIENT_CLINIC_OR_DEPARTMENT_OTHER): Payer: Self-pay | Admitting: *Deleted

## 2021-04-25 ENCOUNTER — Emergency Department (HOSPITAL_BASED_OUTPATIENT_CLINIC_OR_DEPARTMENT_OTHER)
Admission: EM | Admit: 2021-04-25 | Discharge: 2021-04-25 | Disposition: A | Discharge status: Home / Self Care | Payer: Self-pay | Attending: Emergency Medicine | Admitting: Emergency Medicine

## 2021-04-25 DIAGNOSIS — L089 Local infection of the skin and subcutaneous tissue, unspecified: Secondary | ICD-10-CM | POA: Insufficient documentation

## 2021-04-25 DIAGNOSIS — Z7984 Long term (current) use of oral hypoglycemic drugs: Secondary | ICD-10-CM | POA: Insufficient documentation

## 2021-04-25 DIAGNOSIS — E119 Type 2 diabetes mellitus without complications: Secondary | ICD-10-CM | POA: Insufficient documentation

## 2021-04-25 DIAGNOSIS — F17210 Nicotine dependence, cigarettes, uncomplicated: Secondary | ICD-10-CM | POA: Insufficient documentation

## 2021-04-25 DIAGNOSIS — Z4800 Encounter for change or removal of nonsurgical wound dressing: Secondary | ICD-10-CM | POA: Insufficient documentation

## 2021-04-25 MED ORDER — DOXYCYCLINE HYCLATE 100 MG PO CAPS: 100.0000 mg | ORAL_CAPSULE | Freq: Two times a day (BID) | ORAL | 0 refills | Status: DC

## 2021-04-25 MED ORDER — TERBINAFINE HCL 1 % EX CREA: 1.0000 "application " | TOPICAL_CREAM | Freq: Two times a day (BID) | CUTANEOUS | 0 refills | Status: AC

## 2021-04-25 NOTE — Discharge Instructions (Addendum)
Stop picking at skin.

## 2021-04-25 NOTE — ED Triage Notes (Signed)
Pt reports he has had "ringworm" for several months. Scattered wounds noted to forearms

## 2021-04-26 NOTE — ED Provider Notes (Signed)
[facility] EMERGENCY DEPARTMENT Provider Note   CSN: [id number] Arrival date & time: 04/25/21  2022     History Chief Complaint  Patient presents with   Wound Check    Tanner [name] is a 55 y.o. male.  ch  The history is provided by the patient. No language interpreter was used.  Wound Check This is a recurrent problem. The current episode started more than 1 week ago. The problem has been gradually worsening. Nothing aggravates the symptoms. Nothing relieves the symptoms. He has tried nothing for the symptoms. The treatment provided no relief.    Pt reports he has multiple sores on his skin.  Pt is worried that he has ringworm.  Pt thinks he sees worms coming out. Pt has an oval area he thinks was ringworm.   Past Medical History:  Diagnosis Date   Diabetes mellitus without complication (HCC)    Drug abuse (HCC)    Tobacco abuse     Patient Active Problem List   Diagnosis Date Noted   Cellulitis of right foot 05/17/2019   Diabetes mellitus without complication (HCC) 05/17/2019   Tobacco abuse    Drug abuse (HCC)     Past Surgical History:  Procedure Laterality Date   SKIN GRAFT         Family History  Problem Relation Age of Onset   Dementia Father    Diabetes Mellitus II Brother     Social History   Tobacco Use   Smoking status: Every Day    Types: Cigarettes   Smokeless tobacco: Never  Substance Use Topics   Alcohol use: Never   Drug use: Yes    Types: Methamphetamines, IV    Home Medications Prior to Admission medications   Medication Sig Start Date End Date Taking? Authorizing Provider  doxycycline (VIBRAMYCIN) 100 MG capsule Take 1 capsule (100 mg total) by mouth 2 (two) times daily. 04/25/21  Yes Cheron Schaumann K, PA-C  terbinafine (LAMISIL) 1 % cream Apply 1 application topically 2 (two) times daily for 7 days. 04/25/21 05/02/21 Yes Elson Areas, PA-C  acetaminophen (TYLENOL) 500 MG tablet Take 2 tablets (1,000 mg total) by mouth  every 6 (six) hours as needed for moderate pain (foot pain). Patient not taking: Reported on 09/24/2019 05/21/19   Dorcas Carrow, MD  cephALEXin (KEFLEX) 500 MG capsule Take 1 capsule (500 mg total) by mouth 4 (four) times daily. 01/17/21   Geoffery Lyons, MD  metFORMIN (GLUCOPHAGE) 1000 MG tablet Take 1 tablet (1,000 mg total) by mouth 2 (two) times daily. 08/22/19 09/23/28  Nira Conn, MD  naloxone [facility]) 0.4 MG/ML injection Use per directions 09/24/19   Mesner, Barbara Cower, MD  tamsulosin (FLOMAX) 0.4 MG CAPS capsule Take 0.4 mg by mouth daily. 03/18/19   [provider]    Allergies    Patient has no known allergies.  Review of Systems   Review of Systems  All other systems reviewed and are negative.  Physical Exam Updated Vital Signs BP (!) 153/101 (BP Location: Right Arm)   Pulse 84   Temp 97.9 F (36.6 C) (Oral)   Resp 18   Ht 6' (1.829 m)   SpO2 97%   BMI 22.38 kg/m   Physical Exam Vitals reviewed.  Cardiovascular:     Rate and Rhythm: Normal rate.  Pulmonary:     Effort: Pulmonary effort is normal.  Abdominal:     General: Abdomen is flat.  Skin:    Findings: Erythema and rash  present.     Comments: Multiple red raised sores,    Neurological:     General: No focal deficit present.     Mental Status: He is alert.  Psychiatric:        Mood and Affect: Mood normal.    ED Results / Procedures / Treatments   Labs (all labs ordered are listed, but only abnormal results are displayed) Labs Reviewed - No data to display  EKG None  Radiology No results found.  Procedures Procedures   Medications Ordered in ED Medications - No data to display  ED Course  I have reviewed the triage vital signs and the nursing notes.  Pertinent labs & imaging results that were available during my care of the patient were reviewed by me and considered in my medical decision making (see chart for details).    MDM Rules/Calculators/A&P                           Pt  picking at skin.  He feels like he needs to get worms out.  Pt advised ringworm is not a worm infection.  Pt advised sores look like mrsa.  Pt advised to stop picking at them.  Pt given rx for doxycycline  and lamisil   Final Clinical Impression(s) / ED Diagnoses Final diagnoses:  Skin infection    Rx / DC Orders ED Discharge Orders          Ordered    doxycycline (VIBRAMYCIN) 100 MG capsule  2 times daily        04/25/21 2148    terbinafine (LAMISIL) 1 % cream  2 times daily        04/25/21 2148          An After Visit Summary was printed and given to the patient.    Elson Areas, PA-C 04/26/21 0005    Alvira Monday, MD 04/27/21 1256

## 2021-08-21 ENCOUNTER — Other Ambulatory Visit: Payer: Self-pay

## 2021-08-21 ENCOUNTER — Other Ambulatory Visit (HOSPITAL_BASED_OUTPATIENT_CLINIC_OR_DEPARTMENT_OTHER): Payer: Self-pay

## 2021-08-21 ENCOUNTER — Emergency Department (HOSPITAL_BASED_OUTPATIENT_CLINIC_OR_DEPARTMENT_OTHER)
Admission: EM | Admit: 2021-08-21 | Discharge: 2021-08-21 | Disposition: A | Discharge status: Home / Self Care | Payer: Self-pay | Attending: Emergency Medicine | Admitting: Emergency Medicine

## 2021-08-21 ENCOUNTER — Encounter (HOSPITAL_BASED_OUTPATIENT_CLINIC_OR_DEPARTMENT_OTHER): Payer: Self-pay | Admitting: Emergency Medicine

## 2021-08-21 DIAGNOSIS — R030 Elevated blood-pressure reading, without diagnosis of hypertension: Secondary | ICD-10-CM | POA: Insufficient documentation

## 2021-08-21 DIAGNOSIS — K029 Dental caries, unspecified: Secondary | ICD-10-CM | POA: Insufficient documentation

## 2021-08-21 MED ORDER — ACETAMINOPHEN 325 MG PO TABS
650.0000 mg | ORAL_TABLET | Freq: Once | ORAL | Status: AC
  Administered 2021-08-21: 650 mg via ORAL
  Filled 2021-08-21: qty 2

## 2021-08-21 MED ORDER — AMOXICILLIN 500 MG PO CAPS
1000.0000 mg | ORAL_CAPSULE | Freq: Once | ORAL | Status: AC
  Administered 2021-08-21: 1000 mg via ORAL
  Filled 2021-08-21: qty 2

## 2021-08-21 MED ORDER — HYDROCODONE-ACETAMINOPHEN 5-325 MG PO TABS
ORAL_TABLET | ORAL | 0 refills | Status: DC
  Filled 2021-08-21: qty 6, 1d supply, fill #0

## 2021-08-21 MED ORDER — HYDROCODONE-ACETAMINOPHEN 5-325 MG PO TABS: 1.0000 | ORAL_TABLET | ORAL | 0 refills | Status: DC | PRN

## 2021-08-21 MED ORDER — AMOXICILLIN 500 MG PO CAPS
1000.0000 mg | ORAL_CAPSULE | Freq: Two times a day (BID) | ORAL | 0 refills | Status: DC
  Filled 2021-08-21: qty 40, 10d supply, fill #0

## 2021-08-21 MED ORDER — IBUPROFEN 400 MG PO TABS
400.0000 mg | ORAL_TABLET | Freq: Once | ORAL | Status: AC
  Administered 2021-08-21: 400 mg via ORAL
  Filled 2021-08-21: qty 1

## 2021-08-21 NOTE — ED Provider Notes (Signed)
[facility] EMERGENCY DEPARTMENT Provider Note   CSN: [id number] Arrival date & time: 08/21/21  0359     History  Chief Complaint  Patient presents with   Dental Pain    Tanner [name] is a 56 y.o. male.  The history is provided by the patient.  Dental Pain He has had a loose tooth on the right side upper for about the last month.  It has been intermittently painful.  Tonight, it seemed to move in a way that gave intense pain which she was not able to control at home.  He has been using Goody powder, BC powder as well as topical anesthetics with only slight relief.  He has not seen a dentist.   Home Medications Prior to Admission medications   Medication Sig Start Date End Date Taking? Authorizing Provider  acetaminophen (TYLENOL) 500 MG tablet Take 2 tablets (1,000 mg total) by mouth every 6 (six) hours as needed for moderate pain (foot pain). Patient not taking: Reported on 09/24/2019 05/21/19   Dorcas Carrow, MD  cephALEXin (KEFLEX) 500 MG capsule Take 1 capsule (500 mg total) by mouth 4 (four) times daily. 01/17/21   Geoffery Lyons, MD  doxycycline (VIBRAMYCIN) 100 MG capsule Take 1 capsule (100 mg total) by mouth 2 (two) times daily. 04/25/21   Elson Areas, PA-C  metFORMIN (GLUCOPHAGE) 1000 MG tablet Take 1 tablet (1,000 mg total) by mouth 2 (two) times daily. 08/22/19 09/23/28  Nira Conn, MD  naloxone [facility]) 0.4 MG/ML injection Use per directions 09/24/19   Mesner, Barbara Cower, MD  tamsulosin (FLOMAX) 0.4 MG CAPS capsule Take 0.4 mg by mouth daily. 03/18/19   [provider]      Allergies    Patient has no known allergies.    Review of Systems   Review of Systems  All other systems reviewed and are negative.  Physical Exam Updated Vital Signs BP (!) 164/89    Pulse 81    Temp 98 F (36.7 C) (Oral)    Resp 18    Ht 6' (1.829 m)    Wt 74.8 kg    SpO2 100%    BMI 22.38 kg/m  Physical Exam Vitals and nursing note reviewed.  56 year old male, resting  comfortably and in no acute distress. Vital signs are significant for elevated blood pressure. Oxygen saturation is 100%, which is normal. Head is normocephalic and atraumatic. PERRLA, EOMI. Oropharynx is clear.  Dentition is very poor with multiple caries.  Tooth #2 has a large cavity and is tender to percussion.  There is no underlying gingival swelling, erythema, pallor. Neck is nontender and supple without adenopathy or JVD. Back is nontender and there is no CVA tenderness. Lungs are clear without rales, wheezes, or rhonchi. Chest is nontender. Heart has regular rate and rhythm without murmur. Abdomen is soft, flat, nontender without masses or hepatosplenomegaly and peristalsis is normoactive. Extremities have no cyanosis or edema, full range of motion is present. Skin is warm and dry without rash. Neurologic: Mental status is normal, cranial nerves are intact, there are no motor or sensory deficits.  ED Results / Procedures / Treatments    Procedures Procedures    Medications Ordered in ED Medications  amoxicillin (AMOXIL) capsule 1,000 mg (has no administration in time range)  ibuprofen (ADVIL) tablet 400 mg (has no administration in time range)  acetaminophen (TYLENOL) tablet 650 mg (has no administration in time range)    ED Course/ Medical Decision Making/ A&P  Medical Decision Making  Pain due to dental caries.  Old records are reviewed, and he does have several ED visits for dental pain in 2018.  He is given a prescription for amoxicillin and told to use ibuprofen and acetaminophen for pain control.  He is referred to the on-call dentist.  Importance of dental follow-up was stressed.  Also, blood pressure is elevated today, recommended it be rechecked as an outpatient.        Final Clinical Impression(s) / ED Diagnoses Final diagnoses:  Pain due to dental caries  Elevated blood-pressure reading without diagnosis of hypertension    Rx /  DC Orders ED Discharge Orders     None      Dental pain have several ED visits for   Dione Booze, MD 08/21/21 [phone]

## 2021-08-21 NOTE — ED Triage Notes (Signed)
Pt c/o tooth pain on left upper molar

## 2021-08-21 NOTE — Discharge Instructions (Addendum)
Please use acetaminophen and ibuprofen as your main means of pain control.  Take 2 ibuprofen every 6 hours, and 2 regular strength acetaminophen tablets every 6 hours.  Reserve hydrocodone-acetaminophen for pain not relieved with above medications.  Even if your tooth starts to feel better, you need to see a dentist to have it fixed.  Antibiotics do not correct the problem, only take care of any infection that may be contributing to pain.  Your blood pressure was high today.  That may be because of pain, but may be because you have high blood pressure.  Please have your blood pressure rechecked several times in the next 1-2 weeks.  If your blood pressure continues to be high, you will need to be on medication to control it.  High blood pressure which is not adequately controlled can lead to heart attacks, strokes, kidney failure.

## 2021-08-28 ENCOUNTER — Other Ambulatory Visit (HOSPITAL_BASED_OUTPATIENT_CLINIC_OR_DEPARTMENT_OTHER): Payer: Self-pay

## 2021-10-21 ENCOUNTER — Emergency Department (HOSPITAL_BASED_OUTPATIENT_CLINIC_OR_DEPARTMENT_OTHER)
Admission: EM | Admit: 2021-10-21 | Discharge: 2021-10-21 | Disposition: A | Discharge status: Home / Self Care | Payer: Self-pay | Attending: Emergency Medicine | Admitting: Emergency Medicine

## 2021-10-21 ENCOUNTER — Other Ambulatory Visit (HOSPITAL_BASED_OUTPATIENT_CLINIC_OR_DEPARTMENT_OTHER): Payer: Self-pay

## 2021-10-21 ENCOUNTER — Encounter (HOSPITAL_BASED_OUTPATIENT_CLINIC_OR_DEPARTMENT_OTHER): Payer: Self-pay | Admitting: *Deleted

## 2021-10-21 DIAGNOSIS — W868XXA Exposure to other electric current, initial encounter: Secondary | ICD-10-CM

## 2021-10-21 DIAGNOSIS — X17XXXA Contact with hot engines, machinery and tools, initial encounter: Secondary | ICD-10-CM | POA: Insufficient documentation

## 2021-10-21 DIAGNOSIS — T23201A Burn of second degree of right hand, unspecified site, initial encounter: Secondary | ICD-10-CM | POA: Insufficient documentation

## 2021-10-21 DIAGNOSIS — T23251A Burn of second degree of right palm, initial encounter: Secondary | ICD-10-CM

## 2021-10-21 DIAGNOSIS — T300 Burn of unspecified body region, unspecified degree: Secondary | ICD-10-CM

## 2021-10-21 DIAGNOSIS — T754XXA Electrocution, initial encounter: Secondary | ICD-10-CM | POA: Insufficient documentation

## 2021-10-21 LAB — CBC
HCT: 38.2 % — ABNORMAL LOW (ref 39.0–52.0)
Hemoglobin: 13.6 g/dL (ref 13.0–17.0)
MCH: 29.1 pg (ref 26.0–34.0)
MCHC: 35.6 g/dL (ref 30.0–36.0)
MCV: 81.8 fL (ref 80.0–100.0)
Platelets: 336 10*3/uL (ref 150–400)
RBC: 4.67 MIL/uL (ref 4.22–5.81)
RDW: 13 % (ref 11.5–15.5)
WBC: 5.5 10*3/uL (ref 4.0–10.5)
nRBC: 0 % (ref 0.0–0.2)

## 2021-10-21 LAB — MAGNESIUM: Magnesium: 1.6 mg/dL — ABNORMAL LOW (ref 1.7–2.4)

## 2021-10-21 LAB — BASIC METABOLIC PANEL
Anion gap: 9 (ref 5–15)
BUN: 15 mg/dL (ref 6–20)
CO2: 26 mmol/L (ref 22–32)
Calcium: 9.2 mg/dL (ref 8.9–10.3)
Chloride: 98 mmol/L (ref 98–111)
Creatinine, Ser: 0.88 mg/dL (ref 0.61–1.24)
GFR, Estimated: >60 mL/min (ref >60)
Glucose, Bld: 228 mg/dL — ABNORMAL HIGH (ref 70–99)
Potassium: 4.1 mmol/L (ref 3.5–5.1)
Sodium: 133 mmol/L — ABNORMAL LOW (ref 135–145)

## 2021-10-21 LAB — CK: Total CK: 134 U/L (ref 49–397)

## 2021-10-21 MED ORDER — HYDROMORPHONE HCL 1 MG/ML IJ SOLN
1.0000 mg | Freq: Once | INTRAMUSCULAR | Status: AC
  Administered 2021-10-21: 1 mg via INTRAVENOUS
  Filled 2021-10-21: qty 1

## 2021-10-21 MED ORDER — HYDROCODONE-ACETAMINOPHEN 5-325 MG PO TABS
2.0000 | ORAL_TABLET | ORAL | 0 refills | Status: DC | PRN
  Filled 2021-10-21: qty 15, 2d supply, fill #0

## 2021-10-21 MED ORDER — HYDROCODONE-ACETAMINOPHEN 5-325 MG PO TABS
2.0000 | ORAL_TABLET | ORAL | 0 refills | Status: DC | PRN
  Filled 2021-10-21: qty 10, 1d supply, fill #0

## 2021-10-21 MED ORDER — SILVER SULFADIAZINE 1 % EX CREA
TOPICAL_CREAM | Freq: Once | CUTANEOUS | Status: AC
  Administered 2021-10-21: 1 via TOPICAL
  Filled 2021-10-21: qty 85

## 2021-10-21 MED ORDER — FENTANYL CITRATE PF 50 MCG/ML IJ SOSY
100.0000 ug | PREFILLED_SYRINGE | Freq: Once | INTRAMUSCULAR | Status: AC
  Administered 2021-10-21: 100 ug via INTRAVENOUS
  Filled 2021-10-21: qty 2

## 2021-10-21 MED ORDER — HYDROMORPHONE HCL 1 MG/ML IJ SOLN: 1.0000 mg | Freq: Once | INTRAMUSCULAR | Status: DC
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
## 2021-10-21 NOTE — ED Notes (Signed)
Wound care performed, cleaned with NS, silvadene applied, covered with telfa, sterile 4x4, wrapped with kerlix and ace wrap. Pt tolerated well.  ?
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
## 2021-10-22 ENCOUNTER — Encounter (HOSPITAL_BASED_OUTPATIENT_CLINIC_OR_DEPARTMENT_OTHER): Payer: Self-pay

## 2021-10-22 ENCOUNTER — Emergency Department (HOSPITAL_BASED_OUTPATIENT_CLINIC_OR_DEPARTMENT_OTHER)
Admission: EM | Admit: 2021-10-22 | Discharge: 2021-10-22 | Discharge status: Against Medical Advice | Payer: Self-pay | Attending: Emergency Medicine | Admitting: Emergency Medicine

## 2021-10-22 ENCOUNTER — Other Ambulatory Visit: Payer: Self-pay

## 2021-10-22 DIAGNOSIS — T23001A Burn of unspecified degree of right hand, unspecified site, initial encounter: Secondary | ICD-10-CM | POA: Insufficient documentation

## 2021-10-22 DIAGNOSIS — W868XXA Exposure to other electric current, initial encounter: Secondary | ICD-10-CM | POA: Insufficient documentation

## 2021-10-22 DIAGNOSIS — X088XXA Exposure to other specified smoke, fire and flames, initial encounter: Secondary | ICD-10-CM | POA: Insufficient documentation

## 2021-10-22 DIAGNOSIS — Z5321 Procedure and treatment not carried out due to patient leaving prior to being seen by health care provider: Secondary | ICD-10-CM | POA: Insufficient documentation
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
## 2021-11-01 IMAGING — CT CT ABD-PELV W/ CM
2 of 5 series · 16 of 46 positions shown, 18 images · IV contrast (Omnipaque)
Comparison: None.

CLINICAL DATA: Acute abdominal pain and mild jaundice

EXAM:
CT ABDOMEN AND PELVIS WITH CONTRAST
TECHNIQUE: Multidetector CT imaging of the abdomen and pelvis was performed
using the standard protocol following bolus administration of
intravenous contrast.
CONTRAST:  100mL OMNIPAQUE IOHEXOL 300 MG/ML  SOLN

[Series 2: axial st · axial · 0.69mm/px · z∈[+545,+980]mm · 13 of 99 slices shown, 15 images]
[im 6/99  soft-tissue]
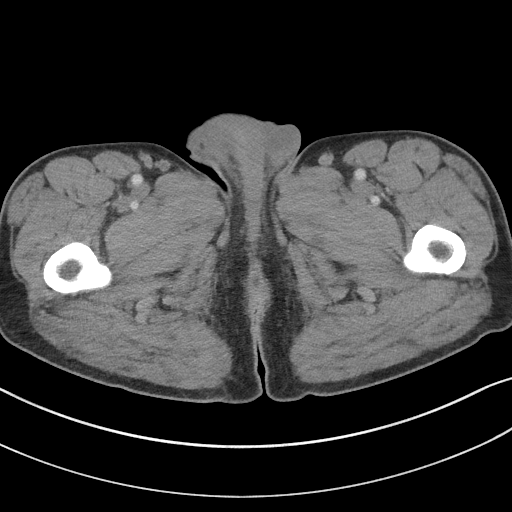
[im 6/99  bone]
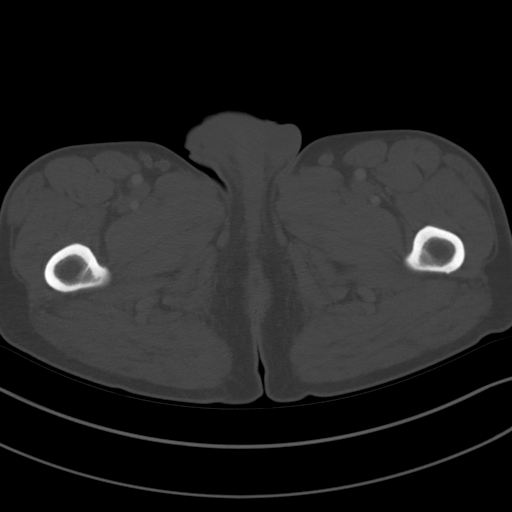
[im 11/99  soft-tissue]
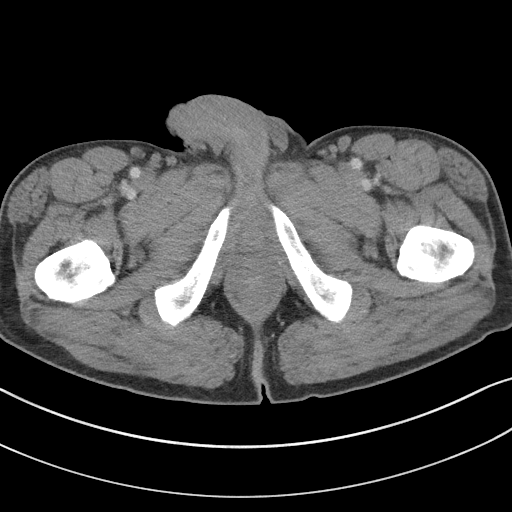
[im 22/99  soft-tissue]
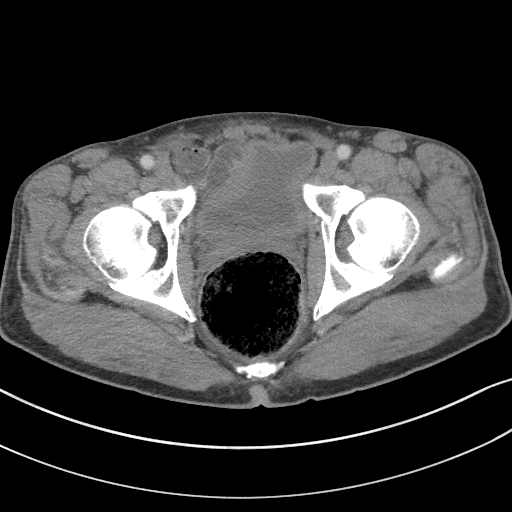
[im 28/99  soft-tissue]
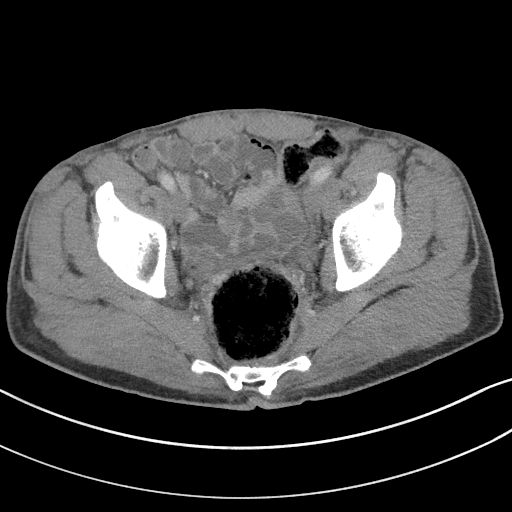
[im 33/99  soft-tissue]
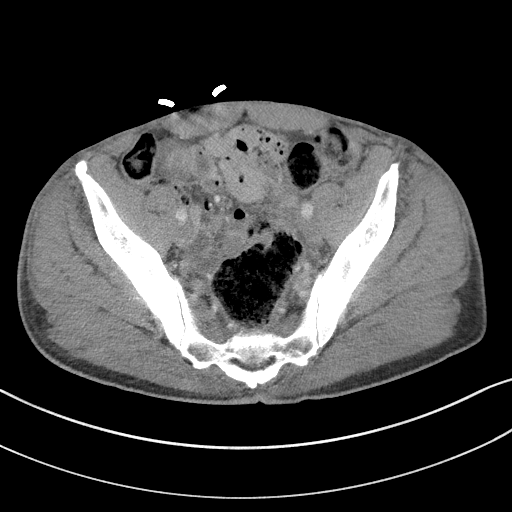
[im 44/99  soft-tissue]
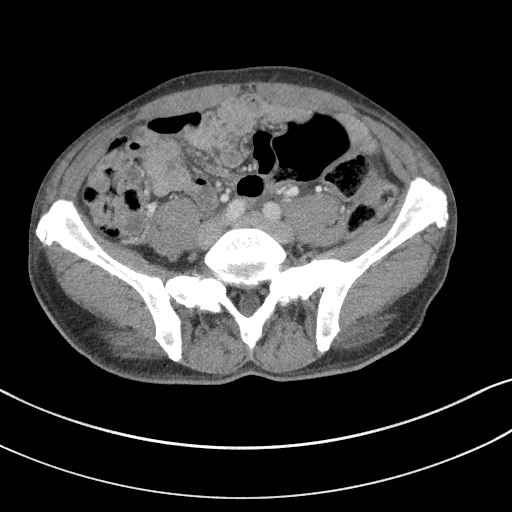
[im 50/99  soft-tissue]
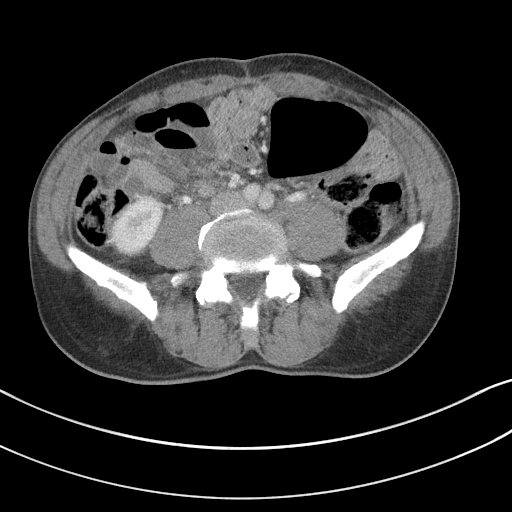
[im 55/99  soft-tissue]
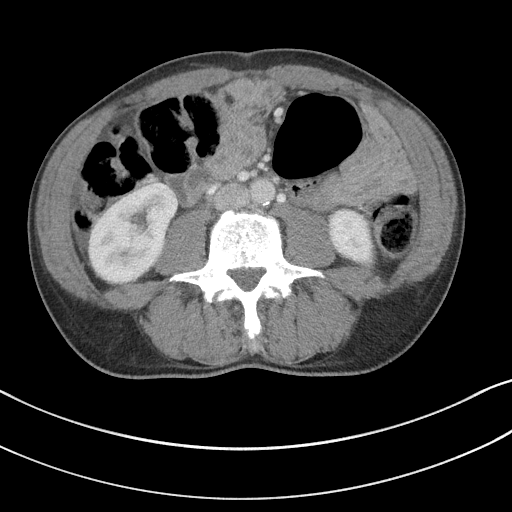
[im 66/99  soft-tissue]
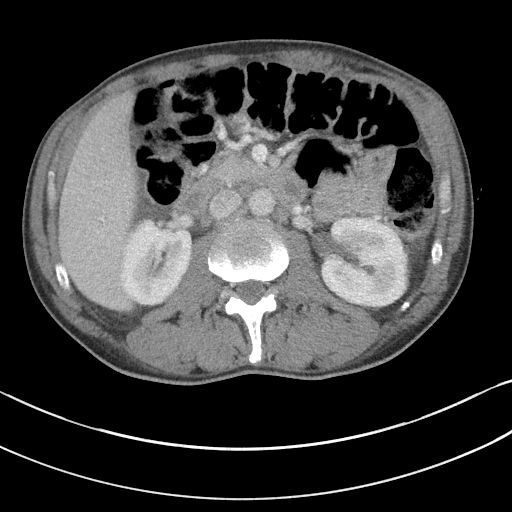
[im 66/99  bone]
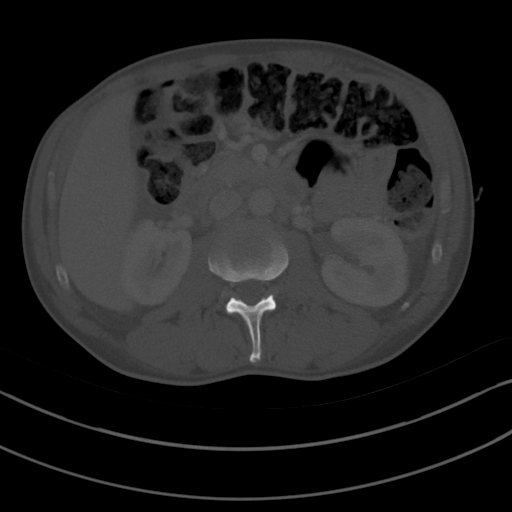
[im 71/99  soft-tissue]
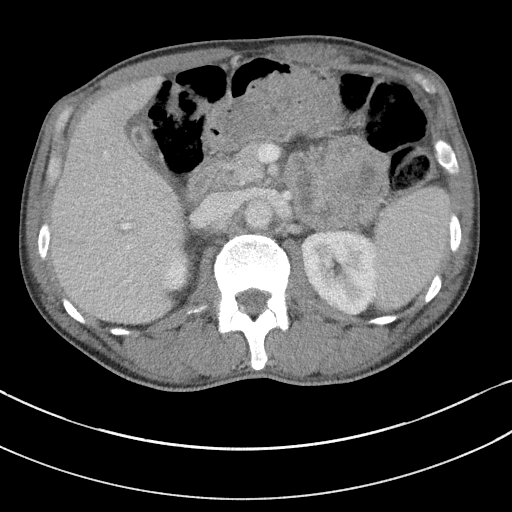
[im 77/99  soft-tissue]
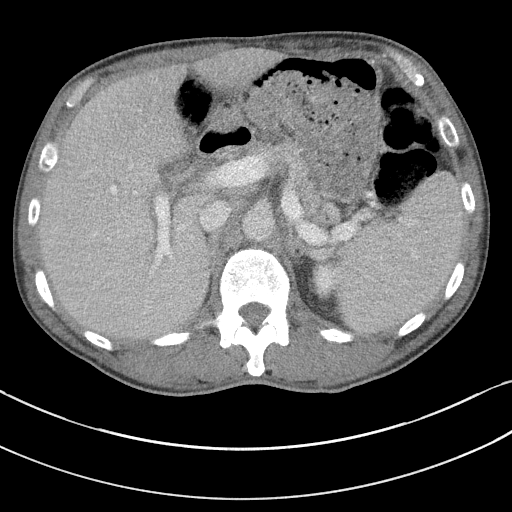
[im 88/99  soft-tissue]
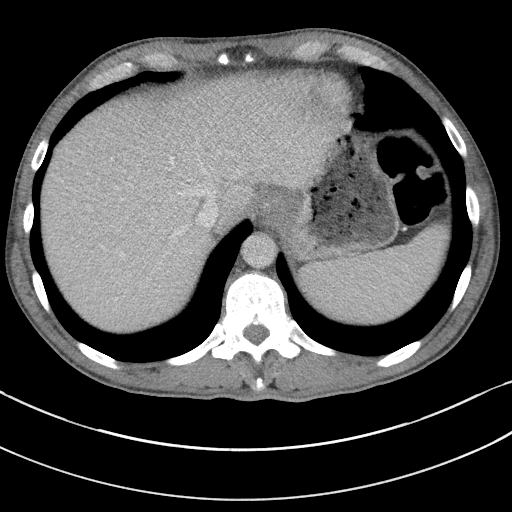
[im 93/99  soft-tissue]
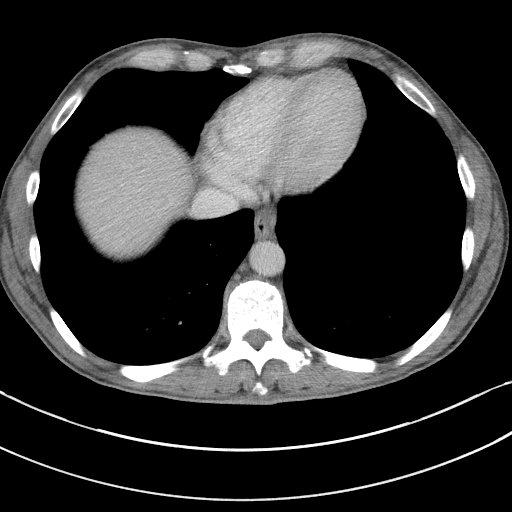

[Series 5: coronal st · coronal · 0.74mm/px · 3 of 101 slices shown]
[im 34/101  soft-tissue]
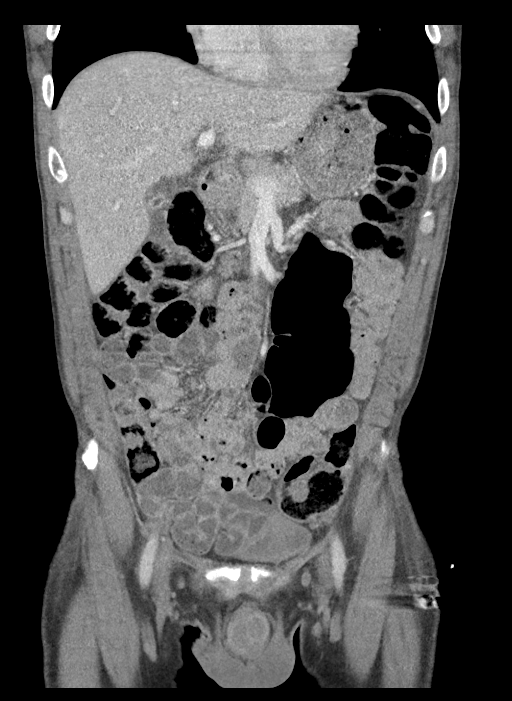
[im 45/101  soft-tissue]
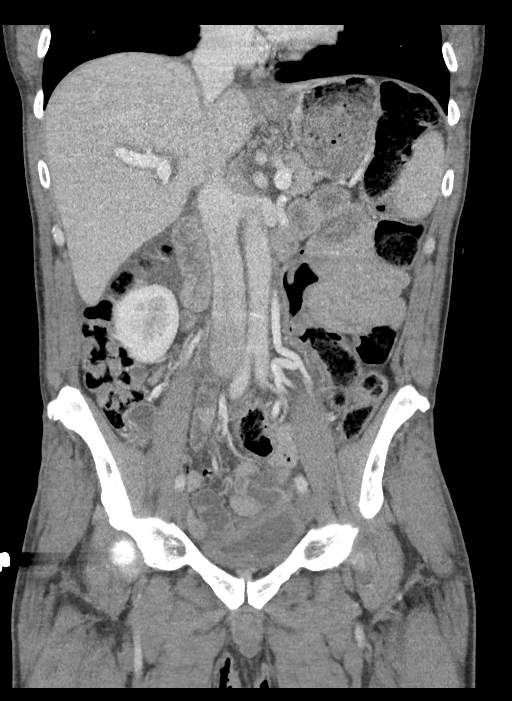
[im 56/101  soft-tissue]
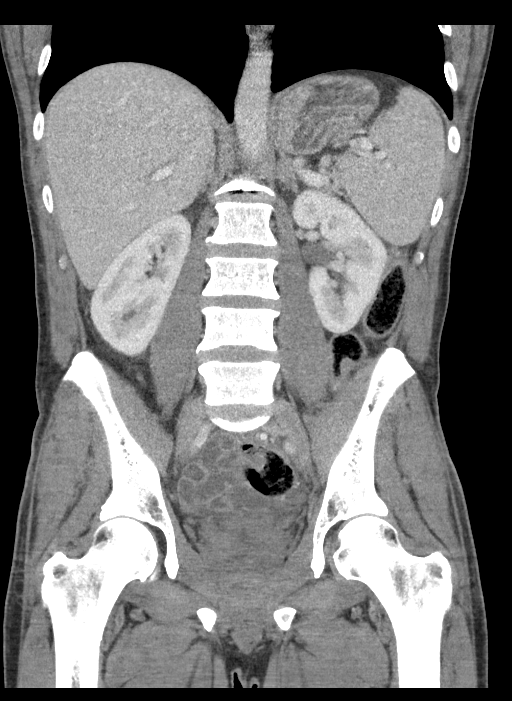

[16 of 46 positions shown; findings below may reference images not displayed]

FINDINGS: Lower chest: No acute abnormality.

Hepatobiliary: Gallbladder is decompressed. Liver demonstrates some
mild periportal edema. No biliary ductal dilatation is noted.

Pancreas: Unremarkable. No pancreatic ductal dilatation or
surrounding inflammatory changes.

Spleen: Normal in size without focal abnormality.

Adrenals/Urinary Tract: Adrenal glands are within normal limits.
Kidneys demonstrate a normal enhancement pattern bilaterally.
Delayed images demonstrate normal excretion of contrast. The bladder
is decompressed.

Stomach/Bowel: No obstructive or inflammatory changes of the colon
are seen. Mild retained fecal material is noted within the colon.
The appendix is within normal limits. Small bowel and stomach show
no acute abnormality.

Vascular/Lymphatic: Aortic atherosclerosis. No enlarged abdominal or
pelvic lymph nodes.

Reproductive: Prostate is unremarkable.

Other: No abdominal wall hernia or abnormality. No abdominopelvic
ascites.

Musculoskeletal: No acute or significant osseous findings.
IMPRESSION: Mild changes of constipation.

Hepatic periportal edema likely related to a degree of underlying
hepatitis.

No other focal abnormality is noted.

## 2021-11-03 ENCOUNTER — Other Ambulatory Visit (HOSPITAL_BASED_OUTPATIENT_CLINIC_OR_DEPARTMENT_OTHER): Payer: Self-pay

## 2022-01-11 ENCOUNTER — Encounter (HOSPITAL_BASED_OUTPATIENT_CLINIC_OR_DEPARTMENT_OTHER): Payer: Self-pay | Admitting: Emergency Medicine

## 2022-01-11 ENCOUNTER — Other Ambulatory Visit: Payer: Self-pay

## 2022-01-11 ENCOUNTER — Emergency Department (HOSPITAL_BASED_OUTPATIENT_CLINIC_OR_DEPARTMENT_OTHER): Payer: Self-pay

## 2022-01-11 ENCOUNTER — Emergency Department (HOSPITAL_BASED_OUTPATIENT_CLINIC_OR_DEPARTMENT_OTHER)
Admission: EM | Admit: 2022-01-11 | Discharge: 2022-01-11 | Disposition: A | Discharge status: Home / Self Care | Payer: Self-pay | Attending: Emergency Medicine | Admitting: Emergency Medicine

## 2022-01-11 DIAGNOSIS — M545 Low back pain, unspecified: Secondary | ICD-10-CM | POA: Insufficient documentation

## 2022-01-11 DIAGNOSIS — M62830 Muscle spasm of back: Secondary | ICD-10-CM | POA: Insufficient documentation

## 2022-01-11 MED ORDER — LIDOCAINE 5 % EX PTCH: 1.0000 | MEDICATED_PATCH | CUTANEOUS | 0 refills | Status: DC

## 2022-01-11 MED ORDER — OXYCODONE-ACETAMINOPHEN 5-325 MG PO TABS
1.0000 | ORAL_TABLET | Freq: Once | ORAL | Status: AC
  Administered 2022-01-11: 1 via ORAL
  Filled 2022-01-11: qty 1

## 2022-01-11 MED ORDER — CYCLOBENZAPRINE HCL 10 MG PO TABS: 10.0000 mg | ORAL_TABLET | Freq: Two times a day (BID) | ORAL | 0 refills | Status: DC | PRN
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
## 2022-01-11 NOTE — ED Notes (Signed)
Patient transported to X-ray 

## 2022-01-11 NOTE — ED Provider Notes (Signed)
[facility] EMERGENCY DEPARTMENT Provider Note   CSN: [id number] Arrival date & time: 01/11/22  1908     History  Chief Complaint  Patient presents with   Back Pain    Guhan Bruington is a 56 y.o. male.  The history is provided by the patient and medical records. No language interpreter was used.  Back Pain Location:  Lumbar spine Quality:  Aching (spasm) Radiates to:  Does not radiate Pain severity:  Severe Onset quality:  Sudden Timing:  Constant Progression:  Waxing and waning Chronicity:  New Context: recent injury and twisting   Relieved by:  Nothing Worsened by:  Movement and twisting Associated symptoms: no abdominal pain, no bladder incontinence, no bowel incontinence, no chest pain, no dysuria, no fever, no headaches, no leg pain, no numbness, no paresthesias, no pelvic pain, no perianal numbness, no tingling and no weakness       Home Medications Prior to Admission medications   Medication Sig Start Date End Date Taking? Authorizing Provider  amoxicillin (AMOXIL) 500 MG capsule Take 2 capsules (1,000 mg total) by mouth 2 (two) times daily. 08/21/21   Dione Booze, MD  HYDROcodone-acetaminophen (NORCO/VICODIN) 5-325 MG tablet Take 2 tablets by mouth every 4 (four) hours as needed. 10/21/21   Roemhildt, Lorin T, PA-C  metFORMIN (GLUCOPHAGE) 1000 MG tablet Take 1 tablet (1,000 mg total) by mouth 2 (two) times daily. 08/22/19 09/23/28  Nira Conn, MD  naloxone [facility]) 0.4 MG/ML injection Use per directions 09/24/19   Mesner, Barbara Cower, MD  tamsulosin (FLOMAX) 0.4 MG CAPS capsule Take 0.4 mg by mouth daily. 03/18/19   [provider]      Allergies    Patient has no known allergies.    Review of Systems   Review of Systems  Constitutional:  Negative for chills, fatigue and fever.  HENT:  Negative for congestion.   Respiratory:  Negative for cough, chest tightness, shortness of breath and wheezing.   Cardiovascular:  Negative for chest pain and  palpitations.  Gastrointestinal:  Negative for abdominal pain, bowel incontinence, diarrhea, nausea and vomiting.  Genitourinary:  Negative for bladder incontinence, dysuria, flank pain, frequency and pelvic pain.  Musculoskeletal:  Positive for back pain. Negative for neck pain and neck stiffness.  Skin:  Negative for rash and wound.  Neurological:  Negative for tingling, weakness, light-headedness, numbness, headaches and paresthesias.  Psychiatric/Behavioral:  Negative for agitation and confusion.   All other systems reviewed and are negative.  Physical Exam Updated Vital Signs BP (!) 146/99 (BP Location: Right Arm)   Pulse 69   Temp 97.9 F (36.6 C) (Oral)   Resp 18   Ht 6' (1.829 m)   Wt 77.1 kg   SpO2 100%   BMI 23.06 kg/m  Physical Exam Vitals and nursing note reviewed.  Constitutional:      General: He is not in acute distress.    Appearance: He is well-developed. He is not ill-appearing, toxic-appearing or diaphoretic.  HENT:     Head: Normocephalic and atraumatic.     Nose: Nose normal.     Mouth/Throat:     Mouth: Mucous membranes are moist.  Eyes:     Extraocular Movements: Extraocular movements intact.     Conjunctiva/sclera: Conjunctivae normal.     Pupils: Pupils are equal, round, and reactive to light.  Cardiovascular:     Rate and Rhythm: Normal rate and regular rhythm.     Heart sounds: No murmur heard. Pulmonary:     Effort:  Pulmonary effort is normal. No respiratory distress.     Breath sounds: Normal breath sounds. No wheezing, rhonchi or rales.  Chest:     Chest wall: No tenderness.  Abdominal:     General: Abdomen is flat.     Palpations: Abdomen is soft.     Tenderness: There is no abdominal tenderness. There is no right CVA tenderness, left CVA tenderness, guarding or rebound.  Musculoskeletal:        General: Tenderness present. No swelling.     Cervical back: Neck supple.     Lumbar back: Spasms and tenderness present.       Back:      Comments:  No numbness, tingling, or weakness of extremities.  No loss of bowel or bladder control.  Tenderness and spasm palpated on exam.  No midline tenderness.  No urinary complaints.  Skin:    General: Skin is warm and dry.     Capillary Refill: Capillary refill takes less than 2 seconds.     Findings: No erythema or rash.  Neurological:     General: No focal deficit present.     Mental Status: He is alert.     Sensory: No sensory deficit.     Motor: No weakness.  Psychiatric:        Mood and Affect: Mood normal.    ED Results / Procedures / Treatments   Labs (all labs ordered are listed, but only abnormal results are displayed) Labs Reviewed - No data to display  EKG None  Radiology DG Lumbar Spine Complete  Result Date: 01/11/2022 CLINICAL DATA:  Twisting injury with sudden onset low back pain a few days ago, unable to move without pain. EXAM: LUMBAR SPINE - COMPLETE 4+ VIEW COMPARISON:  CT abdomen and pelvis and reconstructions 08/21/2019. FINDINGS: There is mild osteopenia without evidence of fractures. There is preservation of the normal vertebral body heights and normal alignment. There are moderate features of marginal osteophytosis of the lumbar spine without bridging osteophytes. There is mild facet spurring from L3-4 down without visible significant foraminal narrowing on the obliques. Right SI joint is patent. There is ankylosis across the upper to mid left SI joint, as before. Moderate to severe stool retention is again shown. IMPRESSION: Osteopenia and degenerative change without evidence of fractures or malalignment. Osteopenia somewhat unusual in a male of this age. Clinical correlation advised. Chronic constipation. Electronically Signed   By: Almira BarKeith  Chesser M.D.   On: 01/11/2022 20:37    Procedures Procedures    Medications Ordered in ED Medications  oxyCODONE-acetaminophen (PERCOCET/ROXICET) 5-325 MG per tablet 1 tablet (1 tablet Oral Given 01/11/22 2016)     ED Course/ Medical Decision Making/ A&P                           Medical Decision Making Amount and/or Complexity of Data Reviewed Radiology: ordered.  Risk Prescription drug management.    Corey SkainsVincent Ricciardi is a 56 y.o. male with history of diabetes who presents with left low back pain.  Patient reports that for the last 3 days patient has severe pain in his left low back that is worsened with any movement.  He reports that something was falling off a wall and he twisted try to catch it and had sudden onset of pain.  He denies history of previous back injury or back surgeries.  He reports no loss of bowel or bladder control and denies any numbness or weakness in  the legs.  He reports it hurts whenever he moves his body or twisted.  He denies any chest pain or shortness of breath.  Denies fevers, chills, nausea, vomiting, constipation, diarrhea, or urinary changes.  Reports the pain is greater than 10 out of 10.  He denies other complaints.  On exam, lungs clear and chest nontender.  Abdomen nontender.  Patient has tenderness in his paraspinal left low back with palpable and visible spasms.  Midline was nontender.  No numbness, tingling, weakness of extremities.  Exam otherwise unremarkable.  Clinically suspect patient pulled his muscles in his back and is having some associated spasm.  We will get an x-ray given the sudden onset of the pain to look for some egregious abnormality of the bones however I suspect he will likely be stable for discharge with muscle relaxant and Lidoderm patches and sports medicine follow-up.  No red flags of numbness, tingling, weakness, or loss of bowel or bladder control to suggest a cord injury at this time.  Anticipate reassessment after x-rays.  We will give a pain pill while we wait for work-up to return.  X-ray showed evidence of surprisingly early osteopenia but no acute fracture or dislocation.  Suspect muscle spasm.  After the pain pill he was resting  comfortably and calmly but still reporting some spasm.  We will give prescription for muscle relaxant and Lidoderm patches and follow-up with sports medicine and PCP.  Patient agrees with plan of care and was discharged in good condition after otherwise reassuring work-up.         Final Clinical Impression(s) / ED Diagnoses Final diagnoses:  Acute left-sided low back pain without sciatica  Muscle spasm of back    Rx / DC Orders ED Discharge Orders          Ordered    cyclobenzaprine (FLEXERIL) 10 MG tablet  2 times daily PRN        01/11/22 2306    lidocaine (LIDODERM) 5 %  Every 24 hours        01/11/22 2306            Clinical Impression: 1. Acute left-sided low back pain without sciatica   2. Muscle spasm of back     Disposition: Discharge  Condition: Good  I have discussed the results, Dx and Tx plan with the pt(& family if present). He/she/they expressed understanding and agree(s) with the plan. Discharge instructions discussed at great length. Strict return precautions discussed and pt &/or family have verbalized understanding of the instructions. No further questions at time of discharge.    Discharge Medication List as of 01/11/2022 11:08 PM     START taking these medications   Details  cyclobenzaprine (FLEXERIL) 10 MG tablet Take 1 tablet (10 mg total) by mouth 2 (two) times daily as needed for muscle spasms., Starting Mon 01/11/2022, Print    lidocaine (LIDODERM) 5 % Place 1 patch onto the skin daily. Remove & Discard patch within 12 hours or as directed by MD, Starting Mon 01/11/2022, Print        Follow Up: Myra Rude, MD [address] [place] [address] [phone]     [facility] [place] [facility] [address] [id number] [address] [place] [address] [phone]        Josephene Marrone, Canary Brim, MD 01/12/22 Marlyne Beards

## 2022-01-11 NOTE — Discharge Instructions (Addendum)
Your history, exam, work-up today are consistent with muscle spasms and pain likely from the maneuver twisting her back.  The x-ray did not show any acute fracture but did show some evidence of early osteopenia.  Please follow-up with sports medicine and PCP for further evaluation and management of this.  Please use the muscle relaxant and Lidoderm patches help with symptoms and try to rest for the next few days.  If any symptoms change or worsen acutely, please return to the nearest emergency department.

## 2022-01-17 ENCOUNTER — Emergency Department (HOSPITAL_COMMUNITY): Payer: Self-pay

## 2022-01-17 ENCOUNTER — Encounter (HOSPITAL_COMMUNITY): Payer: Self-pay

## 2022-01-17 ENCOUNTER — Inpatient Hospital Stay (HOSPITAL_COMMUNITY)
Admission: EM | Admit: 2022-01-17 | Discharge: 2022-02-19 | DRG: 539 | Disposition: A | Discharge status: Home / Self Care | Payer: Self-pay | Attending: Internal Medicine | Admitting: Internal Medicine

## 2022-01-17 ENCOUNTER — Other Ambulatory Visit: Payer: Self-pay

## 2022-01-17 DIAGNOSIS — D649 Anemia, unspecified: Secondary | ICD-10-CM | POA: Diagnosis present

## 2022-01-17 DIAGNOSIS — M545 Low back pain, unspecified: Principal | ICD-10-CM

## 2022-01-17 DIAGNOSIS — M6008 Infective myositis, other site: Secondary | ICD-10-CM | POA: Diagnosis present

## 2022-01-17 DIAGNOSIS — K5903 Drug induced constipation: Secondary | ICD-10-CM | POA: Diagnosis not present

## 2022-01-17 DIAGNOSIS — E119 Type 2 diabetes mellitus without complications: Secondary | ICD-10-CM

## 2022-01-17 DIAGNOSIS — B9561 Methicillin susceptible Staphylococcus aureus infection as the cause of diseases classified elsewhere: Secondary | ICD-10-CM | POA: Diagnosis present

## 2022-01-17 DIAGNOSIS — E440 Moderate protein-calorie malnutrition: Secondary | ICD-10-CM | POA: Diagnosis present

## 2022-01-17 DIAGNOSIS — F1510 Other stimulant abuse, uncomplicated: Secondary | ICD-10-CM | POA: Diagnosis present

## 2022-01-17 DIAGNOSIS — Z8619 Personal history of other infectious and parasitic diseases: Secondary | ICD-10-CM

## 2022-01-17 DIAGNOSIS — Z833 Family history of diabetes mellitus: Secondary | ICD-10-CM

## 2022-01-17 DIAGNOSIS — F1990 Other psychoactive substance use, unspecified, uncomplicated: Secondary | ICD-10-CM | POA: Diagnosis present

## 2022-01-17 DIAGNOSIS — K0889 Other specified disorders of teeth and supporting structures: Secondary | ICD-10-CM | POA: Diagnosis present

## 2022-01-17 DIAGNOSIS — E861 Hypovolemia: Secondary | ICD-10-CM | POA: Diagnosis not present

## 2022-01-17 DIAGNOSIS — Z720 Tobacco use: Secondary | ICD-10-CM | POA: Diagnosis present

## 2022-01-17 DIAGNOSIS — Z7984 Long term (current) use of oral hypoglycemic drugs: Secondary | ICD-10-CM

## 2022-01-17 DIAGNOSIS — M4626 Osteomyelitis of vertebra, lumbar region: Principal | ICD-10-CM | POA: Diagnosis present

## 2022-01-17 DIAGNOSIS — Z560 Unemployment, unspecified: Secondary | ICD-10-CM

## 2022-01-17 DIAGNOSIS — G4700 Insomnia, unspecified: Secondary | ICD-10-CM | POA: Diagnosis present

## 2022-01-17 DIAGNOSIS — K5900 Constipation, unspecified: Secondary | ICD-10-CM | POA: Diagnosis present

## 2022-01-17 DIAGNOSIS — G062 Extradural and subdural abscess, unspecified: Secondary | ICD-10-CM

## 2022-01-17 DIAGNOSIS — M4656 Other infective spondylopathies, lumbar region: Secondary | ICD-10-CM | POA: Diagnosis present

## 2022-01-17 DIAGNOSIS — I330 Acute and subacute infective endocarditis: Secondary | ICD-10-CM | POA: Diagnosis present

## 2022-01-17 DIAGNOSIS — E1165 Type 2 diabetes mellitus with hyperglycemia: Secondary | ICD-10-CM | POA: Diagnosis present

## 2022-01-17 DIAGNOSIS — M869 Osteomyelitis, unspecified: Secondary | ICD-10-CM

## 2022-01-17 DIAGNOSIS — F22 Delusional disorders: Secondary | ICD-10-CM

## 2022-01-17 DIAGNOSIS — F17210 Nicotine dependence, cigarettes, uncomplicated: Secondary | ICD-10-CM | POA: Diagnosis present

## 2022-01-17 DIAGNOSIS — J90 Pleural effusion, not elsewhere classified: Secondary | ICD-10-CM | POA: Diagnosis not present

## 2022-01-17 DIAGNOSIS — D509 Iron deficiency anemia, unspecified: Secondary | ICD-10-CM | POA: Diagnosis present

## 2022-01-17 DIAGNOSIS — F1910 Other psychoactive substance abuse, uncomplicated: Secondary | ICD-10-CM | POA: Diagnosis present

## 2022-01-17 DIAGNOSIS — T402X5A Adverse effect of other opioids, initial encounter: Secondary | ICD-10-CM | POA: Diagnosis not present

## 2022-01-17 DIAGNOSIS — E871 Hypo-osmolality and hyponatremia: Secondary | ICD-10-CM | POA: Diagnosis present

## 2022-01-17 DIAGNOSIS — I10 Essential (primary) hypertension: Secondary | ICD-10-CM | POA: Diagnosis present

## 2022-01-17 DIAGNOSIS — G8929 Other chronic pain: Secondary | ICD-10-CM | POA: Diagnosis present

## 2022-01-17 DIAGNOSIS — R7881 Bacteremia: Secondary | ICD-10-CM | POA: Diagnosis present

## 2022-01-17 DIAGNOSIS — M4620 Osteomyelitis of vertebra, site unspecified: Secondary | ICD-10-CM

## 2022-01-17 HISTORY — DX: Type 2 diabetes mellitus with hyperglycemia: E11.65

## 2022-01-17 LAB — BASIC METABOLIC PANEL
Anion gap: 11 (ref 5–15)
BUN: 20 mg/dL (ref 6–20)
CO2: 25 mmol/L (ref 22–32)
Calcium: 8.5 mg/dL — ABNORMAL LOW (ref 8.9–10.3)
Chloride: 88 mmol/L — ABNORMAL LOW (ref 98–111)
Creatinine, Ser: 0.84 mg/dL (ref 0.61–1.24)
GFR, Estimated: >60 mL/min (ref >60)
Glucose, Bld: 460 mg/dL — ABNORMAL HIGH (ref 70–99)
Potassium: 3.7 mmol/L (ref 3.5–5.1)
Sodium: 124 mmol/L — ABNORMAL LOW (ref 135–145)

## 2022-01-17 LAB — HEPATIC FUNCTION PANEL
ALT: 16 U/L (ref 0–44)
AST: 16 U/L (ref 15–41)
Albumin: 2.7 g/dL — ABNORMAL LOW (ref 3.5–5.0)
Alkaline Phosphatase: 109 U/L (ref 38–126)
Bilirubin, Direct: 0.1 mg/dL (ref 0.0–0.2)
Indirect Bilirubin: 0.7 mg/dL (ref 0.3–0.9)
Total Bilirubin: 0.8 mg/dL (ref 0.3–1.2)
Total Protein: 6.8 g/dL (ref 6.5–8.1)

## 2022-01-17 LAB — CBC WITH DIFFERENTIAL/PLATELET
Abs Immature Granulocytes: 0.11 10*3/uL — ABNORMAL HIGH (ref 0.00–0.07)
Basophils Absolute: 0 10*3/uL (ref 0.0–0.1)
Basophils Relative: 0 %
Eosinophils Absolute: 0 10*3/uL (ref 0.0–0.5)
Eosinophils Relative: 0 %
HCT: 31.4 % — ABNORMAL LOW (ref 39.0–52.0)
Hemoglobin: 11.1 g/dL — ABNORMAL LOW (ref 13.0–17.0)
Immature Granulocytes: 1 %
Lymphocytes Relative: 7 %
Lymphs Abs: 0.8 10*3/uL (ref 0.7–4.0)
MCH: 27.9 pg (ref 26.0–34.0)
MCHC: 35.4 g/dL (ref 30.0–36.0)
MCV: 78.9 fL — ABNORMAL LOW (ref 80.0–100.0)
Monocytes Absolute: 0.9 10*3/uL (ref 0.1–1.0)
Monocytes Relative: 8 %
Neutro Abs: 9.5 10*3/uL — ABNORMAL HIGH (ref 1.7–7.7)
Neutrophils Relative %: 84 %
Platelets: 258 10*3/uL (ref 150–400)
RBC: 3.98 MIL/uL — ABNORMAL LOW (ref 4.22–5.81)
RDW: 13 % (ref 11.5–15.5)
WBC: 11.3 10*3/uL — ABNORMAL HIGH (ref 4.0–10.5)
nRBC: 0 % (ref 0.0–0.2)

## 2022-01-17 LAB — GLUCOSE, CAPILLARY
Glucose-Capillary: 352 mg/dL — ABNORMAL HIGH (ref 70–99)
Glucose-Capillary: 347 mg/dL — ABNORMAL HIGH (ref 70–99)

## 2022-01-17 LAB — MAGNESIUM: Magnesium: 1.6 mg/dL — ABNORMAL LOW (ref 1.7–2.4)

## 2022-01-17 LAB — PHOSPHORUS: Phosphorus: 3 mg/dL (ref 2.5–4.6)

## 2022-01-17 LAB — LACTIC ACID, PLASMA
Lactic Acid, Venous: 1.4 mmol/L (ref 0.5–1.9)
Lactic Acid, Venous: 1.6 mmol/L (ref 0.5–1.9)

## 2022-01-17 LAB — CBG MONITORING, ED: Glucose-Capillary: 352 mg/dL — ABNORMAL HIGH (ref 70–99)

## 2022-01-17 LAB — APTT: aPTT: 31 seconds (ref 24–36)

## 2022-01-17 LAB — PROTIME-INR
INR: 1.1 (ref 0.8–1.2)
Prothrombin Time: 13.7 seconds (ref 11.4–15.2)

## 2022-01-17 MED ORDER — METHOCARBAMOL 500 MG PO TABS
500.0000 mg | ORAL_TABLET | Freq: Four times a day (QID) | ORAL | Status: DC | PRN
Start: 2022-01-17 — End: 2022-01-27
  Administered 2022-01-17 – 2022-01-27 (×27): 500 mg via ORAL
  Filled 2022-01-17 (×27): qty 1

## 2022-01-17 MED ORDER — METFORMIN HCL 500 MG PO TABS: 1000.0000 mg | ORAL_TABLET | Freq: Two times a day (BID) | ORAL | Status: DC

## 2022-01-17 MED ORDER — POTASSIUM CHLORIDE IN NACL 20-0.9 MEQ/L-% IV SOLN
INTRAVENOUS | Status: DC
  Filled 2022-01-17 (×2): qty 1000

## 2022-01-17 MED ORDER — KETOROLAC TROMETHAMINE 15 MG/ML IJ SOLN
15.0000 mg | Freq: Once | INTRAMUSCULAR | Status: AC
Start: 2022-01-17 — End: 2022-01-17
  Administered 2022-01-17: 15 mg via INTRAVENOUS
  Filled 2022-01-17: qty 1

## 2022-01-17 MED ORDER — ONDANSETRON HCL 4 MG/2ML IJ SOLN
4.0000 mg | Freq: Once | INTRAMUSCULAR | Status: AC
  Administered 2022-01-17: 4 mg via INTRAVENOUS
  Filled 2022-01-17: qty 2

## 2022-01-17 MED ORDER — ACETAMINOPHEN 325 MG PO TABS
650.0000 mg | ORAL_TABLET | Freq: Four times a day (QID) | ORAL | Status: DC | PRN
  Administered 2022-01-18 – 2022-02-11 (×10): 650 mg via ORAL
  Filled 2022-01-17 (×12): qty 2

## 2022-01-17 MED ORDER — INSULIN ASPART 100 UNIT/ML IJ SOLN
0.0000 [IU] | Freq: Three times a day (TID) | INTRAMUSCULAR | Status: DC
  Administered 2022-01-18: 11 [IU] via SUBCUTANEOUS

## 2022-01-17 MED ORDER — LACTATED RINGERS IV BOLUS
1000.0000 mL | Freq: Once | INTRAVENOUS | Status: AC
  Administered 2022-01-17: 1000 mL via INTRAVENOUS

## 2022-01-17 MED ORDER — ACETAMINOPHEN 650 MG RE SUPP: 650.0000 mg | Freq: Four times a day (QID) | RECTAL | Status: DC | PRN

## 2022-01-17 MED ORDER — INSULIN ASPART 100 UNIT/ML IJ SOLN
15.0000 [IU] | Freq: Once | INTRAMUSCULAR | Status: AC
  Administered 2022-01-17: 15 [IU] via SUBCUTANEOUS
  Filled 2022-01-17: qty 0.15

## 2022-01-17 MED ORDER — GADOBUTROL 1 MMOL/ML IV SOLN
8.0000 mL | Freq: Once | INTRAVENOUS | Status: AC | PRN
  Administered 2022-01-17: 8 mL via INTRAVENOUS

## 2022-01-17 MED ORDER — TRAMADOL HCL 50 MG PO TABS
50.0000 mg | ORAL_TABLET | Freq: Four times a day (QID) | ORAL | Status: DC | PRN
  Administered 2022-01-18 – 2022-02-18 (×49): 50 mg via ORAL
  Filled 2022-01-17 (×50): qty 1

## 2022-01-17 MED ORDER — SODIUM CHLORIDE 0.9 % IV BOLUS
1000.0000 mL | Freq: Once | INTRAVENOUS | Status: AC
  Administered 2022-01-17: 1000 mL via INTRAVENOUS

## 2022-01-17 MED ORDER — ONDANSETRON HCL 4 MG/2ML IJ SOLN: 4.0000 mg | Freq: Four times a day (QID) | INTRAMUSCULAR | Status: DC | PRN

## 2022-01-17 MED ORDER — VANCOMYCIN HCL 1500 MG/300ML IV SOLN
1500.0000 mg | Freq: Once | INTRAVENOUS | Status: AC
  Administered 2022-01-17: 1500 mg via INTRAVENOUS
  Filled 2022-01-17: qty 300

## 2022-01-17 MED ORDER — HYDROMORPHONE HCL 1 MG/ML IJ SOLN
1.0000 mg | Freq: Once | INTRAMUSCULAR | Status: AC
  Administered 2022-01-17: 1 mg via INTRAVENOUS
  Filled 2022-01-17: qty 1

## 2022-01-17 MED ORDER — SODIUM CHLORIDE 0.9 % IV SOLN
2.0000 g | Freq: Once | INTRAVENOUS | Status: AC
  Administered 2022-01-17: 2 g via INTRAVENOUS
  Filled 2022-01-17: qty 12.5

## 2022-01-17 MED ORDER — MAGNESIUM SULFATE 2 GM/50ML IV SOLN
2.0000 g | Freq: Once | INTRAVENOUS | Status: AC
  Administered 2022-01-17: 2 g via INTRAVENOUS
  Filled 2022-01-17 (×2): qty 50

## 2022-01-17 MED ORDER — METHOCARBAMOL 500 MG PO TABS
500.0000 mg | ORAL_TABLET | Freq: Once | ORAL | Status: AC
  Administered 2022-01-17: 500 mg via ORAL
  Filled 2022-01-17: qty 1

## 2022-01-17 MED ORDER — KETOROLAC TROMETHAMINE 15 MG/ML IJ SOLN
15.0000 mg | Freq: Four times a day (QID) | INTRAMUSCULAR | Status: AC | PRN
  Administered 2022-01-17 – 2022-01-19 (×7): 15 mg via INTRAVENOUS
  Filled 2022-01-17 (×7): qty 1

## 2022-01-17 MED ORDER — ONDANSETRON HCL 4 MG PO TABS: 4.0000 mg | ORAL_TABLET | Freq: Four times a day (QID) | ORAL | Status: DC | PRN

## 2022-01-17 MED ORDER — INSULIN ASPART 100 UNIT/ML IJ SOLN
5.0000 [IU] | Freq: Once | INTRAMUSCULAR | Status: AC
  Administered 2022-01-17: 5 [IU] via SUBCUTANEOUS
  Filled 2022-01-17: qty 0.05

## 2022-01-17 MED ORDER — PNEUMOCOCCAL 20-VAL CONJ VACC 0.5 ML IM SUSY
0.5000 mL | PREFILLED_SYRINGE | INTRAMUSCULAR | Status: DC
  Filled 2022-01-17: qty 0.5

## 2022-01-17 MED ORDER — VANCOMYCIN HCL 1250 MG/250ML IV SOLN
1250.0000 mg | Freq: Two times a day (BID) | INTRAVENOUS | Status: DC
  Administered 2022-01-18: 1250 mg via INTRAVENOUS
  Filled 2022-01-17: qty 250

## 2022-01-17 MED ORDER — KETOROLAC TROMETHAMINE 30 MG/ML IJ SOLN
30.0000 mg | Freq: Once | INTRAMUSCULAR | Status: AC
  Administered 2022-01-17: 30 mg via INTRAVENOUS
  Filled 2022-01-17: qty 1

## 2022-01-17 MED ORDER — SODIUM CHLORIDE 0.9 % IV SOLN
2.0000 g | Freq: Three times a day (TID) | INTRAVENOUS | Status: DC
  Administered 2022-01-17: 2 g via INTRAVENOUS
  Filled 2022-01-17 (×2): qty 12.5

## 2022-01-17 MED ORDER — NICOTINE 21 MG/24HR TD PT24
21.0000 mg | MEDICATED_PATCH | Freq: Every day | TRANSDERMAL | Status: DC
  Administered 2022-01-17 – 2022-02-19 (×34): 21 mg via TRANSDERMAL
  Filled 2022-01-17 (×34): qty 1

## 2022-01-17 NOTE — H&P (Addendum)
History and Physical    Patient: Tanner [name] VHQ:[id number] DOB: 10/12/1965 DOA: 01/17/2022 DOS: the patient was seen and examined on 01/17/2022 PCP: Patient, No Pcp Per (Inactive)  Patient coming from: Home  Chief Complaint:  Chief Complaint  Patient presents with   Back Pain   HPI: Tanner [name] is a 56 y.o. male with medical history significant of cellulitis of right foot, type 2 diabetes, polysubstance abuse, opioid dependence, tobacco abuse, periodontal disease, essential hypertension, hepatitis C, vitamin D deficiency who is coming to the emergency department due to progressively worse back pain for the past 2 weeks, but denies fever, chills, or night sweats.  He was seen in the ED on 01/11/2022 with x-rays showing early osteopenia.  He was prescribed cyclobenzaprine and lidocaine patches, which he has now retrieved from the pharmacy.  He denied fecal or urinary incontinence.  No lower extremity weakness and no saddle anesthesia. He denied rhinorrhea, sore throat, wheezing or hemoptysis.  No chest pain, palpitations, diaphoresis, PND, orthopnea or pitting edema of the lower extremities.  No abdominal pain, nausea, emesis, diarrhea, constipation, melena or hematochezia.  No flank pain, dysuria, frequency or hematuria.  No polyuria, polydipsia, polyphagia or blurred vision.   ED course: Initial vital signs were temperature 99.8 F, pulse 98, respiration 20, BP 154/81 mmHg O2 sat 97% on room air.  He received cefepime 2 g IVPB, 1.5 g of vancomycin, regular insulin 5 units SQ x1, Toradol 15 mg IVP, methocarbamol 500 mg IVP and 1000 mL of NS bolus.  I added LR 1000 mL bolus, magnesium sulfate 2 g IVPB, Toradol 30 mg IVP, insulin 15 units SQ x1, hydromorphone 1 mg IVP and ondansetron 4 mg IVP.  Lab work: CBC showed a white count 11.3, hemoglobin 11.1 g/dL and platelets [id number].  PT 13.7, INR 1.1 and PTT 31.  Lactic acid was normal.  BMP with a sodium of 124 and chloride of 88 mmol/L.  However his blood  glucose was 460 mg/dL and corrected sodium is 133 mmol/L.  The rest of the BMP is normal after calcium level is corrected.  LFTs showing an albumin of 2.7 g/dL, but otherwise normal.  Magnesium 1.6 mg/dL.  Imaging: MRI of the thoracic spine with no acute finding.  MRI of the lumbar spine shows septic arthritis of the left L4-L5 facet with associated osteomyelitis, multiloculated left erector spinae muscle abscess and intense regional soft tissue edema/inflammation.  There was inflammation in the left L4 neural foramen.  No epidural abscess or spinal stenosis seen at this time.  No superimposed lumbar discitis or other site of infection at the moment.  Please see images and full radiology report for further details.   Review of Systems: As mentioned in the history of present illness. All other systems reviewed and are negative. Past Medical History:  Diagnosis Date   Cellulitis of right foot 05/17/2019   Diabetes mellitus without complication (HCC)    Drug abuse (HCC)    Essential hypertension 09/20/2017   Formatting of this note might be different from the original. Stable with current therapy; continue  Last Assessment & Plan:  Formatting of this note might be different from the original.   Hepatitis C antibody positive in blood 10/05/2017   Formatting of this note might be different from the original. 10/05/17: ab +, RNA neg on 2/12 labs. Likely past infection that is resolved. F/u prn.   History of vitamin D deficiency 09/20/2017   Formatting of this note might be different from the  original. 09/20/17: per pt; labs 1 wk; supplement prn.   Opioid dependence, uncomplicated (HCC) 02/24/2018   Formatting of this note might be different from the original. Self-medicating with buprenorphine Formatting of this note might be different from the original. Self-medicating with buprenorphine   Periodontal disease 10/11/2017   Formatting of this note might be different from the original. 10/11/17: refer to dental    Tobacco abuse    Type 2 diabetes mellitus with hyperglycemia (HCC) 01/17/2022   Past Surgical History:  Procedure Laterality Date   SKIN GRAFT     Social History:  reports that he has been smoking cigarettes. He has never used smokeless tobacco. He reports current drug use. Drugs: Methamphetamines and IV. He reports that he does not drink alcohol.  No Known Allergies  Family History  Problem Relation Age of Onset   Dementia Father    Diabetes Mellitus II Brother     Prior to Admission medications   Medication Sig Start Date End Date Taking? Authorizing Provider  cyclobenzaprine (FLEXERIL) 10 MG tablet Take 1 tablet (10 mg total) by mouth 2 (two) times daily as needed for muscle spasms. Patient not taking: Reported on 01/17/2022 01/11/22   Tegeler, Canary Brim, MD  HYDROcodone-acetaminophen (NORCO/VICODIN) 5-325 MG tablet Take 2 tablets by mouth every 4 (four) hours as needed. Patient not taking: Reported on 01/17/2022 10/21/21   Roemhildt, Lorin T, PA-C  lidocaine (LIDODERM) 5 % Place 1 patch onto the skin daily. Remove & Discard patch within 12 hours or as directed by MD Patient not taking: Reported on 01/17/2022 01/11/22   Tegeler, Canary Brim, MD  metFORMIN (GLUCOPHAGE) 1000 MG tablet Take 1 tablet (1,000 mg total) by mouth 2 (two) times daily. Patient not taking: Reported on 01/17/2022 08/22/19 09/23/28  Nira Conn, MD  naloxone [facility]) 0.4 MG/ML injection Use per directions Patient not taking: Reported on 01/17/2022 09/24/19   Marily Memos, MD    Physical Exam: Vitals:   01/17/22 0945 01/17/22 1000 01/17/22 1205 01/17/22 1430  BP: (!) 151/82 (!) 142/77 (!) 146/86 (!) 162/87  Pulse: 60 61 62 66  Resp: 19 19 20  (!) 23  Temp:    98.5 F (36.9 C)  TempSrc:    Oral  SpO2: 99% 100% 99% 99%  Weight:      Height:       Physical Exam Vitals and nursing note reviewed.  Constitutional:      Appearance: Normal appearance. He is normal weight. He is ill-appearing. He is not  diaphoretic.  HENT:     Head: Normocephalic.     Mouth/Throat:     Mouth: Mucous membranes are moist.  Eyes:     General: No scleral icterus.    Pupils: Pupils are equal, round, and reactive to light.  Neck:     Vascular: No JVD.  Cardiovascular:     Rate and Rhythm: Normal rate and regular rhythm.     Heart sounds: S1 normal and S2 normal.  Pulmonary:     Effort: Pulmonary effort is normal.     Breath sounds: Normal breath sounds.  Abdominal:     General: Bowel sounds are normal. There is no distension.     Palpations: Abdomen is soft.     Tenderness: There is no abdominal tenderness. There is no right CVA tenderness, left CVA tenderness or guarding.  Musculoskeletal:     Cervical back: Neck supple.     Lumbar back: Spasms and tenderness present. Decreased range of motion.  Right lower leg: No edema.     Left lower leg: No edema.  Skin:    General: Skin is warm and dry.  Neurological:     General: No focal deficit present.     Mental Status: He is alert and oriented to person, place, and time.  Psychiatric:        Mood and Affect: Mood normal.        Behavior: Behavior normal.    Data Reviewed:  Results are pending, will review when available.  Assessment and Plan: Principal Problem:   Septic arthritis of lumbar spine (HCC) Admit to PCU/inpatient. Continue IV fluids. Tramadol 50 mg p.o. every 6 hours as needed. Ketorolac 15 mg IVP every 6 hours as needed. Methocarbamol 500 mg p.o. every 6 hours as needed. Continue cefepime 2 g every 8 hours.   Continue vancomycin per pharmacy. Follow-up blood culture and sensitivity Follow CBC and CMP in a.m. Will need outpatient IV antibiotic therapy.  Active Problems:   Type 2 diabetes mellitus with hyperglycemia (HCC) Carbohydrate modified diet. CBG monitoring with RI SS. Check hemoglobin A1c.    Moderate protein malnutrition (HCC) Protein supplementation. Consider nutritional services evaluation.    Tobacco  abuse NicoDerm ordered. Tobacco cessation advised.    Drug abuse (HCC) Consult TOC. Tramadol/ketorolac after acute pain treated.    Hypocalcemia Corrects with albumin.    Hypomagnesemia Denied EtOH use. Supplemented with 2 g of magnesium sulfate.    Hyponatremia Corrected level 133 mmol/L. No clinical significance at this time. Follow sodium level.    Normocytic anemia Monitor hematocrit and hemoglobin.    Advance Care Planning:   Code Status: Full Code   Consults:   Family Communication:   Severity of Illness: The appropriate patient status for this patient is INPATIENT. Inpatient status is judged to be reasonable and necessary in order to provide the required intensity of service to ensure the patient's safety. The patient's presenting symptoms, physical exam findings, and initial radiographic and laboratory data in the context of their chronic comorbidities is felt to place them at high risk for further clinical deterioration. Furthermore, it is not anticipated that the patient will be medically stable for discharge from the hospital within 2 midnights of admission.   * I certify that at the point of admission it is my clinical judgment that the patient will require inpatient hospital care spanning beyond 2 midnights from the point of admission due to high intensity of service, high risk for further deterioration and high frequency of surveillance required.*  Author: Bobette Mo, MD 01/17/2022 2:56 PM  For on call review [email or web address].  This document was prepared using Dragon voice recognition software may contain some unintended transcription errors.

## 2022-01-17 NOTE — ED Triage Notes (Signed)
Pt to ED by EMS from home with c/o L lower back pain which began 2 weeks ago. Pt states he was seen for this previously and prescribed flexeril which he states he sent his friend to pick up and his friend never returned with it. Pt also presents hyperglycemia (547),BP is also elevated,  however pt states he is not here for his sugar. Arrives A+O, all other VSS, NADN.

## 2022-01-17 NOTE — Progress Notes (Signed)
A consult was received from an ED physician for vancomycin & cefepime per pharmacy dosing.  The patient's profile has been reviewed for ht/wt/allergies/indication/available labs.   A one time order has been placed for cefepime 2 gm & vancomycin 1500 mg.    Further antibiotics/pharmacy consults should be ordered by admitting physician if indicated.                       Thank you,  Herby Abraham, Pharm.D 01/17/2022 1:59 PM

## 2022-01-17 NOTE — ED Notes (Signed)
Pt advised injury happened a week and a half to two weeks ago. Pt advised nothing has helped. Pt advised he was given flexeril and this has not helped. Pt advised pt is in lower back and into left testicle. Pt advised pt is 10 out of 10 with no relief

## 2022-01-17 NOTE — Progress Notes (Addendum)
Pharmacy Antibiotic Note  Tanner [name] is a 56 y.o. male admitted on 01/17/2022 with septic arthritis of lumbar spine with associated osteomyelitis/abscess. Pharmacy has been consulted for Vancomycin dosing.  Plan: Vancomycin 1500mg  IV x 1 given in the ED. Continue with Vancomycin 1250mg  IV q12h. Vancomycin levels at steady state as indicated. Cefepime per MD. Monitor renal function, cultures, clinical course.   Height: 6' (182.9 cm) Weight: 77 kg (169 lb 12.1 oz) IBW/kg (Calculated) : 77.6  Temp (24hrs), Avg:99.2 F (37.3 C), Min:98.5 F (36.9 C), Max:99.8 F (37.7 C)  Recent Labs  Lab 01/17/22 0722 01/17/22 1305  WBC 11.3*  --   CREATININE 0.84  --   LATICACIDVEN  --  1.4    Estimated Creatinine Clearance: 105.7 mL/min (by C-G formula based on SCr of 0.84 mg/dL).    No Known Allergies  Antimicrobials this admission: 6/4 Vancomycin >> 6/4 Cefepime >>  Dose adjustments this admission: --  Microbiology results: 6/4 BCx:    Thank you for allowing pharmacy to be a part of this patient's care.   Lindell Spar, PharmD, BCPS Clinical Pharmacist  01/17/2022 5:37 PM

## 2022-01-17 NOTE — ED Notes (Signed)
Pt also indicated he has not eaten in several days and wants food. Pt was advised he had to wait and see the provider first

## 2022-01-17 NOTE — ED Provider Notes (Signed)
[facility]-EMERGENCY DEPT Provider Note   CSN: [id number] Arrival date & time: 01/17/22  0538     History  Chief Complaint  Patient presents with   Back Pain    Tanner [name] is a 56 y.o. male.  56 year old male with past medical history of IV drug use presents today for evaluation of ongoing back pain.  He was recently evaluated for the same and prescribed symptomatic management however he states he was unable to pick up his medications and has not had any relief.  He denies fever, chills, bowel or bladder incontinence, saddle anesthesia, weakness.  The history is provided by the patient. No language interpreter was used.      Home Medications Prior to Admission medications   Medication Sig Start Date End Date Taking? Authorizing Provider  amoxicillin (AMOXIL) 500 MG capsule Take 2 capsules (1,000 mg total) by mouth 2 (two) times daily. 08/21/21   Dione Booze, MD  cyclobenzaprine (FLEXERIL) 10 MG tablet Take 1 tablet (10 mg total) by mouth 2 (two) times daily as needed for muscle spasms. 01/11/22   Tegeler, Canary Brim, MD  HYDROcodone-acetaminophen (NORCO/VICODIN) 5-325 MG tablet Take 2 tablets by mouth every 4 (four) hours as needed. 10/21/21   Roemhildt, Lorin T, PA-C  lidocaine (LIDODERM) 5 % Place 1 patch onto the skin daily. Remove & Discard patch within 12 hours or as directed by MD 01/11/22   Tegeler, Canary Brim, MD  metFORMIN (GLUCOPHAGE) 1000 MG tablet Take 1 tablet (1,000 mg total) by mouth 2 (two) times daily. 08/22/19 09/23/28  Nira Conn, MD  naloxone [facility]) 0.4 MG/ML injection Use per directions 09/24/19   Mesner, Barbara Cower, MD  tamsulosin (FLOMAX) 0.4 MG CAPS capsule Take 0.4 mg by mouth daily. 03/18/19   [provider]      Allergies    Patient has no known allergies.    Review of Systems   Review of Systems  Constitutional:  Negative for chills and fever.  Gastrointestinal:  Negative for abdominal pain.  Genitourinary:   Negative for difficulty urinating.  Musculoskeletal:  Positive for back pain. Negative for gait problem.  Neurological:  Negative for weakness.  All other systems reviewed and are negative.  Physical Exam Updated Vital Signs BP 129/61   Pulse 69   Temp 99.8 F (37.7 C) (Oral)   Resp 18   Ht 6' (1.829 m)   Wt 77 kg   SpO2 96%   BMI 23.02 kg/m  Physical Exam Vitals and nursing note reviewed.  Constitutional:      General: He is not in acute distress.    Appearance: Normal appearance. He is not ill-appearing.  HENT:     Head: Normocephalic and atraumatic.     Nose: Nose normal.  Eyes:     General: No scleral icterus.    Extraocular Movements: Extraocular movements intact.     Conjunctiva/sclera: Conjunctivae normal.  Cardiovascular:     Rate and Rhythm: Normal rate and regular rhythm.     Pulses: Normal pulses.  Pulmonary:     Effort: Pulmonary effort is normal. No respiratory distress.     Breath sounds: Normal breath sounds. No wheezing or rales.  Abdominal:     General: There is no distension.     Tenderness: There is no abdominal tenderness.  Musculoskeletal:        General: Normal range of motion.     Cervical back: Normal range of motion.     Right lower leg: No edema.  Left lower leg: No edema.     Comments: Lumbar spinal process tenderness to palpation present.  Lumbar paraspinal muscle tenderness present.  Left straight leg raise test positive.  Full range of motion in bilateral lower extremities.  5/5 strength in bilateral lower extremities and extensor and flexor muscle groups.  Skin:    General: Skin is warm and dry.  Neurological:     General: No focal deficit present.     Mental Status: He is alert. Mental status is at baseline.    ED Results / Procedures / Treatments   Labs (all labs ordered are listed, but only abnormal results are displayed) Labs Reviewed  CBC WITH DIFFERENTIAL/PLATELET - Abnormal; Notable for the following components:       Result Value   WBC 11.3 (*)    RBC 3.98 (*)    Hemoglobin 11.1 (*)    HCT 31.4 (*)    MCV 78.9 (*)    Neutro Abs 9.5 (*)    Abs Immature Granulocytes 0.11 (*)    All other components within normal limits  BASIC METABOLIC PANEL - Abnormal; Notable for the following components:   Sodium 124 (*)    Chloride 88 (*)    Glucose, Bld 460 (*)    Calcium 8.5 (*)    All other components within normal limits    EKG None  Radiology No results found.  Procedures Procedures    Medications Ordered in ED Medications  ketorolac (TORADOL) 15 MG/ML injection 15 mg (15 mg Intravenous Given 01/17/22 0718)  methocarbamol (ROBAXIN) tablet 500 mg (500 mg Oral Given 01/17/22 1694)    ED Course/ Medical Decision Making/ A&P Clinical Course as of 01/17/22 1351  Sun Jan 17, 2022  1254 MRI with evidence of septic arthritis at left L4-L5 facet with associated osteomyelitis and multiloculated left erector spinae muscle abscess and regional soft tissue inflammation.  Without evidence of spinal epidural abscess.  Thoracic spine without acute findings.  Will discuss with neurosurgery. CBC with mild leukocytosis of 11.3, mild anemia at hemoglobin of 11.1 otherwise without acute findings.  BMP with sodium of 124, glucose of 460 otherwise without acute findings. [AA]  1314 Discussed with neurosurgery who recommends medicine admission and IV antibiotics.  No intervention from their standpoint at this point. [AA]    Clinical Course User Index [AA] Marita Kansas, PA-C                           Medical Decision Making Amount and/or Complexity of Data Reviewed Labs: ordered. Radiology: ordered. ECG/medicine tests: ordered.  Risk Prescription drug management. Decision regarding hospitalization.   Medical Decision Making / ED Course   This patient presents to the ED for concern of low back pain, this involves an extensive number of treatment options, and is a complaint that carries with it a high risk of  complications and morbidity.  The differential diagnosis includes muscle strain, lumbar fracture, spinal epidural abscess, cauda equina syndrome  MDM: 56 year old male with history of IV drug use presents with back pain.  He does have spinal process tenderness on exam.  Recently evaluated for same however was unable to pick up Flexeril.  Will order basic blood work, provide muscle relaxer, Toradol.  MRI ordered.  Blood cultures, lactic acid, broad-spectrum IV antibiotics ordered given evidence of osteomyelitis and abscess on lumbar MRI.  Discussed with neurosurgery who recommends IV antibiotics and medical admission.  No intervention at this time.  Discussed  with hospitalist will evaluate patient for admission.    Lab Tests: -I ordered, reviewed, and interpreted labs.   The pertinent results include:   Labs Reviewed  CBC WITH DIFFERENTIAL/PLATELET - Abnormal; Notable for the following components:      Result Value   WBC 11.3 (*)    RBC 3.98 (*)    Hemoglobin 11.1 (*)    HCT 31.4 (*)    MCV 78.9 (*)    Neutro Abs 9.5 (*)    Abs Immature Granulocytes 0.11 (*)    All other components within normal limits  BASIC METABOLIC PANEL - Abnormal; Notable for the following components:   Sodium 124 (*)    Chloride 88 (*)    Glucose, Bld 460 (*)    Calcium 8.5 (*)    All other components within normal limits      EKG  EKG Interpretation  Date/Time:    Ventricular Rate:    PR Interval:    QRS Duration:   QT Interval:    QTC Calculation:   R Axis:     Text Interpretation:           Imaging Studies ordered: I ordered imaging studies including MRI thoracic, MRI lumbar spine I independently visualized and interpreted imaging. I agree with the radiologist interpretation   Medicines ordered and prescription drug management: Meds ordered this encounter  Medications   ketorolac (TORADOL) 15 MG/ML injection 15 mg   methocarbamol (ROBAXIN) tablet 500 mg    -I have reviewed the  patients home medicines and have made adjustments as needed  Reevaluation: After the interventions noted above, I reevaluated the patient and found that they have :stayed the same  Co morbidities that complicate the patient evaluation  Past Medical History:  Diagnosis Date   Diabetes mellitus without complication (HCC)    Drug abuse (HCC)    Tobacco abuse       Dispostion: Discussed with hospitalist will evaluate patient for admission.  Final Clinical Impression(s) / ED Diagnoses Final diagnoses:  Acute low back pain, unspecified back pain laterality, unspecified whether sciatica present  Osteomyelitis of other site, unspecified type [facility](HCC)  Septic arthritis of lumbar spine (HCC)  Paraspinal abscess [facility](HCC)    Rx / DC Orders ED Discharge Orders     None         Marita Kansasli, Emersyn Wyss, PA-C 01/17/22 1354    Virgina Norfolkuratolo, Adam, DO 01/17/22 1433

## 2022-01-18 ENCOUNTER — Inpatient Hospital Stay (HOSPITAL_COMMUNITY): Payer: Self-pay

## 2022-01-18 DIAGNOSIS — R7881 Bacteremia: Secondary | ICD-10-CM

## 2022-01-18 DIAGNOSIS — F1910 Other psychoactive substance abuse, uncomplicated: Secondary | ICD-10-CM

## 2022-01-18 LAB — BLOOD CULTURE ID PANEL (REFLEXED) - BCID2

## 2022-01-18 LAB — HIV ANTIBODY (ROUTINE TESTING W REFLEX): HIV Screen 4th Generation wRfx: NONREACTIVE

## 2022-01-18 LAB — COMPREHENSIVE METABOLIC PANEL
ALT: 15 U/L (ref 0–44)
AST: 15 U/L (ref 15–41)
Albumin: 2.4 g/dL — ABNORMAL LOW (ref 3.5–5.0)
Alkaline Phosphatase: 108 U/L (ref 38–126)
Anion gap: 8 (ref 5–15)
BUN: 20 mg/dL (ref 6–20)
CO2: 24 mmol/L (ref 22–32)
Calcium: 7.8 mg/dL — ABNORMAL LOW (ref 8.9–10.3)
Chloride: 93 mmol/L — ABNORMAL LOW (ref 98–111)
Creatinine, Ser: 0.84 mg/dL (ref 0.61–1.24)
GFR, Estimated: >60 mL/min (ref >60)
Glucose, Bld: 324 mg/dL — ABNORMAL HIGH (ref 70–99)
Potassium: 4.5 mmol/L (ref 3.5–5.1)
Sodium: 125 mmol/L — ABNORMAL LOW (ref 135–145)
Total Bilirubin: 0.8 mg/dL (ref 0.3–1.2)
Total Protein: 6.1 g/dL — ABNORMAL LOW (ref 6.5–8.1)

## 2022-01-18 LAB — ECHOCARDIOGRAM COMPLETE
Area-P 1/2: 3.26 cm2
Calc EF: 52.5 %
Height: 72 in
S' Lateral: 3.5 cm
Single Plane A2C EF: 50.9 %
Single Plane A4C EF: 55.8 %
Weight: 2716.07 oz

## 2022-01-18 LAB — CBC
HCT: 32.7 % — ABNORMAL LOW (ref 39.0–52.0)
Hemoglobin: 11.4 g/dL — ABNORMAL LOW (ref 13.0–17.0)
MCH: 27.8 pg (ref 26.0–34.0)
MCHC: 34.9 g/dL (ref 30.0–36.0)
MCV: 79.8 fL — ABNORMAL LOW (ref 80.0–100.0)
Platelets: 304 10*3/uL (ref 150–400)
RBC: 4.1 MIL/uL — ABNORMAL LOW (ref 4.22–5.81)
RDW: 13.1 % (ref 11.5–15.5)
WBC: 14.1 10*3/uL — ABNORMAL HIGH (ref 4.0–10.5)
nRBC: 0 % (ref 0.0–0.2)

## 2022-01-18 LAB — HEMOGLOBIN A1C
Hgb A1c MFr Bld: 10.8 % — ABNORMAL HIGH (ref 4.8–5.6)
Mean Plasma Glucose: 263.26 mg/dL

## 2022-01-18 LAB — GLUCOSE, CAPILLARY
Glucose-Capillary: 347 mg/dL — ABNORMAL HIGH (ref 70–99)
Glucose-Capillary: 300 mg/dL — ABNORMAL HIGH (ref 70–99)
Glucose-Capillary: 211 mg/dL — ABNORMAL HIGH (ref 70–99)
Glucose-Capillary: 177 mg/dL — ABNORMAL HIGH (ref 70–99)

## 2022-01-18 LAB — C-REACTIVE PROTEIN: CRP: 20.5 mg/dL — ABNORMAL HIGH (ref <1.0)

## 2022-01-18 LAB — SEDIMENTATION RATE: Sed Rate: 50 mm/hr — ABNORMAL HIGH (ref 0–16)

## 2022-01-18 LAB — MAGNESIUM: Magnesium: 1.9 mg/dL (ref 1.7–2.4)

## 2022-01-18 MED ORDER — INSULIN GLARGINE-YFGN 100 UNIT/ML ~~LOC~~ SOLN
10.0000 [IU] | Freq: Every day | SUBCUTANEOUS | Status: DC
  Administered 2022-01-18 – 2022-01-20 (×3): 10 [IU] via SUBCUTANEOUS
  Filled 2022-01-18 (×3): qty 0.1

## 2022-01-18 MED ORDER — INSULIN ASPART 100 UNIT/ML IJ SOLN
0.0000 [IU] | Freq: Every day | INTRAMUSCULAR | Status: DC
  Administered 2022-01-19: 3 [IU] via SUBCUTANEOUS
  Administered 2022-01-21 – 2022-01-22 (×2): 2 [IU] via SUBCUTANEOUS
  Administered 2022-01-23 – 2022-01-25 (×2): 3 [IU] via SUBCUTANEOUS
  Administered 2022-01-26: 2 [IU] via SUBCUTANEOUS
  Administered 2022-01-27: 3 [IU] via SUBCUTANEOUS
  Administered 2022-01-28 – 2022-01-29 (×2): 2 [IU] via SUBCUTANEOUS
  Administered 2022-02-01: 3 [IU] via SUBCUTANEOUS

## 2022-01-18 MED ORDER — MORPHINE SULFATE (PF) 2 MG/ML IV SOLN
1.0000 mg | Freq: Once | INTRAVENOUS | Status: AC
  Administered 2022-01-18: 1 mg via INTRAVENOUS
  Filled 2022-01-18: qty 1

## 2022-01-18 MED ORDER — CEFAZOLIN SODIUM-DEXTROSE 2-4 GM/100ML-% IV SOLN
2.0000 g | Freq: Three times a day (TID) | INTRAVENOUS | Status: DC
  Administered 2022-01-18 – 2022-02-19 (×97): 2 g via INTRAVENOUS
  Filled 2022-01-18 (×99): qty 100

## 2022-01-18 MED ORDER — POTASSIUM CHLORIDE IN NACL 20-0.9 MEQ/L-% IV SOLN
INTRAVENOUS | Status: AC
  Filled 2022-01-18 (×4): qty 1000

## 2022-01-18 MED ORDER — INSULIN ASPART 100 UNIT/ML IJ SOLN
0.0000 [IU] | Freq: Three times a day (TID) | INTRAMUSCULAR | Status: DC
  Administered 2022-01-18 (×2): 8 [IU] via SUBCUTANEOUS
  Administered 2022-01-18 – 2022-01-19 (×2): 11 [IU] via SUBCUTANEOUS
  Administered 2022-01-19: 3 [IU] via SUBCUTANEOUS
  Administered 2022-01-19: 2 [IU] via SUBCUTANEOUS
  Administered 2022-01-20 (×2): 5 [IU] via SUBCUTANEOUS
  Administered 2022-01-20: 8 [IU] via SUBCUTANEOUS
  Administered 2022-01-21: 3 [IU] via SUBCUTANEOUS
  Administered 2022-01-21: 8 [IU] via SUBCUTANEOUS
  Administered 2022-01-21: 3 [IU] via SUBCUTANEOUS
  Administered 2022-01-22: 8 [IU] via SUBCUTANEOUS
  Administered 2022-01-22 – 2022-01-23 (×3): 3 [IU] via SUBCUTANEOUS
  Administered 2022-01-23: 5 [IU] via SUBCUTANEOUS
  Administered 2022-01-24: 2 [IU] via SUBCUTANEOUS
  Administered 2022-01-24: 3 [IU] via SUBCUTANEOUS
  Administered 2022-01-24: 5 [IU] via SUBCUTANEOUS
  Administered 2022-01-25 (×2): 3 [IU] via SUBCUTANEOUS
  Administered 2022-01-25: 5 [IU] via SUBCUTANEOUS
  Administered 2022-01-26: 8 [IU] via SUBCUTANEOUS
  Administered 2022-01-26: 3 [IU] via SUBCUTANEOUS
  Administered 2022-01-27: 8 [IU] via SUBCUTANEOUS
  Administered 2022-01-27: 3 [IU] via SUBCUTANEOUS
  Administered 2022-01-27: 2 [IU] via SUBCUTANEOUS
  Administered 2022-01-28: 5 [IU] via SUBCUTANEOUS
  Administered 2022-01-28: 3 [IU] via SUBCUTANEOUS
  Administered 2022-01-29: 2 [IU] via SUBCUTANEOUS
  Administered 2022-01-29 (×2): 3 [IU] via SUBCUTANEOUS
  Administered 2022-01-30: 5 [IU] via SUBCUTANEOUS
  Administered 2022-01-30: 2 [IU] via SUBCUTANEOUS
  Administered 2022-01-30: 3 [IU] via SUBCUTANEOUS
  Administered 2022-01-31 (×2): 5 [IU] via SUBCUTANEOUS
  Administered 2022-02-01: 2 [IU] via SUBCUTANEOUS
  Administered 2022-02-01: 3 [IU] via SUBCUTANEOUS
  Administered 2022-02-01: 2 [IU] via SUBCUTANEOUS
  Administered 2022-02-02: 3 [IU] via SUBCUTANEOUS
  Administered 2022-02-02 (×2): 2 [IU] via SUBCUTANEOUS
  Administered 2022-02-03: 3 [IU] via SUBCUTANEOUS
  Administered 2022-02-03: 5 [IU] via SUBCUTANEOUS

## 2022-01-18 NOTE — Progress Notes (Signed)
PHARMACY - PHYSICIAN COMMUNICATION CRITICAL VALUE ALERT - BLOOD CULTURE IDENTIFICATION (BCID)  Tanner [name] is an 56 y.o. male who presented to [facility] on 01/17/2022 with a chief complaint of lower back pain.  Assessment:   MRI of lumbar spine + septic arthritis of left L4-5 facet with associated osteomyelitis. 2/2 blood cx growing [facility]; BCID + MSSA. Currently afebrile, CrCl >111ml/min.   Name of physician (or Provider) Contacted: Toniann Fail  Current antibiotics: Cefepime + Vancomycin  Changes to prescribed antibiotics recommended:  De-escalate antibiotics to Ancef 2gm IV q8h ID auto-consult-->follow for recommendations  Results for orders placed or performed during the hospital encounter of 01/17/22  Blood Culture ID Panel (Reflexed) (Collected: 01/17/2022  1:40 PM)  Result Value Ref Range   Enterococcus faecalis NOT DETECTED NOT DETECTED   Enterococcus Faecium NOT DETECTED NOT DETECTED   Listeria monocytogenes NOT DETECTED NOT DETECTED   Staphylococcus species DETECTED (A) NOT DETECTED   Staphylococcus aureus (BCID) DETECTED (A) NOT DETECTED   Staphylococcus epidermidis NOT DETECTED NOT DETECTED   Staphylococcus lugdunensis NOT DETECTED NOT DETECTED   Streptococcus species NOT DETECTED NOT DETECTED   Streptococcus agalactiae NOT DETECTED NOT DETECTED   Streptococcus pneumoniae NOT DETECTED NOT DETECTED   Streptococcus pyogenes NOT DETECTED NOT DETECTED   A.calcoaceticus-baumannii NOT DETECTED NOT DETECTED   Bacteroides fragilis NOT DETECTED NOT DETECTED   Enterobacterales NOT DETECTED NOT DETECTED   Enterobacter cloacae complex NOT DETECTED NOT DETECTED   Escherichia coli NOT DETECTED NOT DETECTED   Klebsiella aerogenes NOT DETECTED NOT DETECTED   Klebsiella oxytoca NOT DETECTED NOT DETECTED   Klebsiella pneumoniae NOT DETECTED NOT DETECTED   Proteus species NOT DETECTED NOT DETECTED   Salmonella species NOT DETECTED NOT DETECTED   Serratia marcescens NOT DETECTED NOT  DETECTED   Haemophilus influenzae NOT DETECTED NOT DETECTED   Neisseria meningitidis NOT DETECTED NOT DETECTED   Pseudomonas aeruginosa NOT DETECTED NOT DETECTED   Stenotrophomonas maltophilia NOT DETECTED NOT DETECTED   Candida albicans NOT DETECTED NOT DETECTED   Candida auris NOT DETECTED NOT DETECTED   Candida glabrata NOT DETECTED NOT DETECTED   Candida krusei NOT DETECTED NOT DETECTED   Candida parapsilosis NOT DETECTED NOT DETECTED   Candida tropicalis NOT DETECTED NOT DETECTED   Cryptococcus neoformans/gattii NOT DETECTED NOT DETECTED   Meth resistant mecA/C and MREJ NOT DETECTED NOT DETECTED    Junita Push PharmD 01/18/2022  3:16 AM

## 2022-01-18 NOTE — Plan of Care (Signed)
Plan of care reviewed and discussed with the patient. 

## 2022-01-18 NOTE — Progress Notes (Addendum)
PROGRESS NOTE   Tanner [name]  ZOX:[id number]    DOB: 1965/10/15    DOA: 01/17/2022  PCP: Patient, No Pcp Per (Inactive)   I have briefly reviewed patients previous medical records in [facility].  Chief Complaint  Patient presents with   Back Pain    Brief Narrative:  56 year old male, single, independent, currently unemployed, medical history significant for polysubstance abuse (tobacco, IVDA-crystal meth), type II DM, HTN, hepatitis C, cellulitis of right foot, opioid dependence, periodontal disease presented to the ED for the second time in a week with complaints of progressively worsening low back pain x2 weeks.  MRI L-spine confirms L4-5 facet septic arthritis with associated osteomyelitis and multiloculated left erector spinae muscle abscess (12 mL). BCID positive for MSSA.  Ruling out endocarditis.  Neurosurgery and ID consulted.  Currently on empiric IV cefazolin.   Assessment & Plan:  Principal Problem:   Septic arthritis of lumbar spine ([place]) Active Problems:   Tobacco abuse   Drug abuse ([place])   Type 2 diabetes mellitus with hyperglycemia ([facility])   Hypocalcemia   Normocytic anemia   Hyponatremia   Moderate protein malnutrition ([facility])   Hypomagnesemia   Septic arthritis and osteomyelitis of L4-5 facet and multiloculated left erector spinae muscle abscess, secondary to IVDA with MSSA bacteremia, ruling out infective endocarditis:  MRI L-spine confirms L4-5 facet septic arthritis with associated osteomyelitis and multiloculated left erector spinae muscle abscess (12 mL). BCID positive for Staphylococcus. BCID positive for Staphylococcus.  2D echo and if negative will need TTE.  Currently on IV cefazolin.  ID consulted.  Discussed with and consulted neurosurgery as well.  Check and trend CRP and ESR.  Multimodality pain control.  Update: As per communication by Dr. Ellene Route, patient may remain at [facility], treat medically and no surgery currently  indicated.  Polysubstance abuse (tobacco, IVDA): Patient reports that he last did IV crystal meth 4 to 5 days PTA.  Cessation counseled.  Continue nicotine patch.  Uncontrolled type II DM: Last A1c 8.1 on 05/18/2019.  Checking repeat A1c.  Holding home metformin.  CBGs currently uncontrolled in the 300s.  Will add Semglee 10 units daily, change SSI to moderate with bedtime scale.  Monitor CBGs closely and adjust insulins as needed.  Hyponatremia: Corrected serum sodium 130.  Some of it likely due to dehydration or may be SIADH.  IV saline hydration for 24 hours and follow BMP in AM.  Leukocytosis:  Secondary to infectious etiology as above.  Follow CBC.  Microcytic anemia: Follow daily CBC.  Will need to check anemia panel once infection settles down.  Hypomagnesemia: Replace and check.  Moderate protein calorie malnutrition: Registered dietitian consulted.   Body mass index is 23.02 kg/m.  Nutritional Status         DVT prophylaxis: SCDs Start: 01/17/22 1405     Code Status: Full Code:  Family Communication: None at bedside. Disposition:  Status is: Inpatient Remains inpatient appropriate because: IV antibiotics, evaluation for lumbar septic arthritis, abscess     Consultants:   Neurosurgery/Dr. Ellene Route Infectious disease  Procedures:     Antimicrobials:   IV cefepime and vancomycin x1 IV cefazolin   Subjective:  Low back pain, intermittently worse when pain meds wear off.  Worse with movement.  Nonradiating and denies any other neurological symptoms.  Reports using IV crystal meth Wednesday of last week.   Objective:   Vitals:   01/17/22 1643 01/17/22 2100 01/18/22 0134 01/18/22 0546  BP: 131/69 118/79 (!) 147/83 Marland Kitchen)  143/76  Pulse: 66 63 77 72  Resp:  _0 Temp: 99.4 F (37.4 C) 98.8 F (37.1 C) 99.5 F (37.5 C) 97.9 F (36.6 C)  TempSrc: Oral Oral Oral Oral  SpO2: 97% 99% 100% 99%  Weight:      Height:        General exam: Young male,  moderately built and nourished lying comfortably supine in bed without distress. Respiratory system: Clear to auscultation. Respiratory effort normal. Cardiovascular system: S1 & S2 heard, RRR. No JVD, murmurs, rubs, gallops or clicks. No pedal edema. Gastrointestinal system: Abdomen is nondistended, soft and nontender. No organomegaly or masses felt. Normal bowel sounds heard. Central nervous system: Alert and oriented. No focal neurological deficits. Extremities: Symmetric 5 x 5 power. Skin: No rashes, lesions or ulcers Psychiatry: Judgement and insight appear normal. Mood & affect appropriate.     Data Reviewed:   I have personally reviewed following labs and imaging studies   CBC: Recent Labs  Lab 01/17/22 0722 01/18/22 0331  WBC 11.3* 14.1*  NEUTROABS 9.5*  --   HGB 11.1* 11.4*  HCT 31.4* 32.7*  MCV 78.9* 79.8*  PLT 258 443    Basic Metabolic Panel: Recent Labs  Lab 01/17/22 0722 01/18/22 0331  NA 124* 125*  K 3.7 4.5  CL 88* 93*  CO2 25 24  GLUCOSE 460* 324*  BUN 20 20  CREATININE 0.84 0.84  CALCIUM 8.5* 7.8*  MG 1.6*  --   PHOS 3.0  --     Liver Function Tests: Recent Labs  Lab 01/17/22 0722 01/18/22 0331  AST 16 15  ALT 16 15  ALKPHOS 109 108  BILITOT 0.8 0.8  PROT 6.8 6.1*  ALBUMIN 2.7* 2.4*    CBG: Recent Labs  Lab 01/17/22 1650 01/17/22 2105 01/18/22 0730  GLUCAP 352* 347* 347*    Microbiology Studies:   Recent Results (from the past 240 hour(s))  Blood culture (routine x 2)     Status: None (Preliminary result)   Collection Time: 01/17/22  1:40 PM   Specimen: BLOOD  Result Value Ref Range Status   Specimen Description   Final    BLOOD BLOOD LEFT FOREARM Performed at [facility], [place] [address] [place], [place] [address]    Special Requests   Final    BOTTLES DRAWN AEROBIC AND ANAEROBIC Blood Culture adequate volume Performed at [place] [address] [place], [place] [address]     Culture  Setup Time   Final    GRAM POSITIVE COCCI ANAEROBIC BOTTLE ONLY IN BOTH AEROBIC AND ANAEROBIC BOTTLES CRITICAL RESULT CALLED TO, READ BACK BY AND VERIFIED WITH: PHARMD MICHELLE LILLISTON 01/18/22_1 :11 BY TW Performed at [facility], [place] [address], [place], [place] [address]    Culture GRAM POSITIVE COCCI  Final   Report Status PENDING  Incomplete  Blood Culture ID Panel (Reflexed)     Status: Abnormal   Collection Time: 01/17/22  1:40 PM  Result Value Ref Range Status   Enterococcus faecalis NOT DETECTED NOT DETECTED Final   Enterococcus Faecium NOT DETECTED NOT DETECTED Final   Listeria monocytogenes NOT DETECTED NOT DETECTED Final   Staphylococcus species DETECTED (A) NOT DETECTED Final    Comment: CRITICAL RESULT CALLED TO, READ BACK BY AND VERIFIED WITH: PHARMD MICHELLE LILLISTON 01/18/22_2 :11 BY TW    Staphylococcus aureus (BCID) DETECTED (A) NOT DETECTED Final    Comment: CRITICAL RESULT CALLED TO, READ BACK BY AND VERIFIED  WITH: PHARMD MICHELLE LILLISTON 01/18/22_0 :11 BY TW    Staphylococcus epidermidis NOT DETECTED NOT DETECTED Final   Staphylococcus lugdunensis NOT DETECTED NOT DETECTED Final   Streptococcus species NOT DETECTED NOT DETECTED Final   Streptococcus agalactiae NOT DETECTED NOT DETECTED Final   Streptococcus pneumoniae NOT DETECTED NOT DETECTED Final   Streptococcus pyogenes NOT DETECTED NOT DETECTED Final   A.calcoaceticus-baumannii NOT DETECTED NOT DETECTED Final   Bacteroides fragilis NOT DETECTED NOT DETECTED Final   Enterobacterales NOT DETECTED NOT DETECTED Final   Enterobacter cloacae complex NOT DETECTED NOT DETECTED Final   Escherichia coli NOT DETECTED NOT DETECTED Final   Klebsiella aerogenes NOT DETECTED NOT DETECTED Final   Klebsiella oxytoca NOT DETECTED NOT DETECTED Final   Klebsiella pneumoniae NOT DETECTED NOT DETECTED Final   Proteus species NOT DETECTED NOT DETECTED Final   Salmonella species NOT DETECTED NOT DETECTED Final    Serratia marcescens NOT DETECTED NOT DETECTED Final   Haemophilus influenzae NOT DETECTED NOT DETECTED Final   Neisseria meningitidis NOT DETECTED NOT DETECTED Final   Pseudomonas aeruginosa NOT DETECTED NOT DETECTED Final   Stenotrophomonas maltophilia NOT DETECTED NOT DETECTED Final   Candida albicans NOT DETECTED NOT DETECTED Final   Candida auris NOT DETECTED NOT DETECTED Final   Candida glabrata NOT DETECTED NOT DETECTED Final   Candida krusei NOT DETECTED NOT DETECTED Final   Candida parapsilosis NOT DETECTED NOT DETECTED Final   Candida tropicalis NOT DETECTED NOT DETECTED Final   Cryptococcus neoformans/gattii NOT DETECTED NOT DETECTED Final   Meth resistant mecA/C and MREJ NOT DETECTED NOT DETECTED Final    Comment: Performed at [facility], [address] [place], [place] [address]  Blood culture (routine x 2)     Status: None (Preliminary result)   Collection Time: 01/17/22  1:50 PM   Specimen: BLOOD  Result Value Ref Range Status   Specimen Description   Final    BLOOD BLOOD RIGHT HAND Performed at [place] [address], [place], [place] [address]    Special Requests   Final    BOTTLES DRAWN AEROBIC ONLY Blood Culture adequate volume Performed at [facility] [address] [place], [place] [address]    Culture  Setup Time   Final    GRAM POSITIVE COCCI AEROBIC BOTTLE ONLY CRITICAL VALUE NOTED.  VALUE IS CONSISTENT WITH PREVIOUSLY REPORTED AND CALLED VALUE. Performed at [facility], [place] [address], [place], [place] [address]    Culture GRAM POSITIVE COCCI  Final   Report Status PENDING  Incomplete  Blood culture (routine x 2)     Status: None (Preliminary result)   Collection Time: 01/17/22  2:05 PM   Specimen: BLOOD  Result Value Ref Range Status   Specimen Description   Final    BLOOD RIGHT ANTECUBITAL Performed at [place] [address], [place], [place] [address]     Special Requests   Final    BOTTLES DRAWN AEROBIC AND ANAEROBIC Blood Culture results may not be optimal due to an excessive volume of blood received in culture bottles Performed at [facility] [address] [place], [place] [address]    Culture  Setup Time   Final    GRAM POSITIVE COCCI AEROBIC BOTTLE ONLY CRITICAL VALUE NOTED.  VALUE IS CONSISTENT WITH PREVIOUSLY REPORTED AND CALLED VALUE. IN BOTH AEROBIC AND ANAEROBIC BOTTLES Performed at [facility], [place] [address], [place],  [address]  Culture GRAM POSITIVE COCCI  Final   Report Status PENDING  Incomplete    Radiology Studies:  MR THORACIC SPINE W WO CONTRAST  Result Date: 01/17/2022 CLINICAL DATA:  56 year old male with twisting injury, sudden onset back pain last month. Hyperglycemia (547). Query epidural abscess. EXAM: MRI THORACIC WITHOUT AND WITH CONTRAST TECHNIQUE: Multiplanar and multiecho pulse sequences of the thoracic spine were obtained without and with intravenous contrast. CONTRAST:  54m GADAVIST GADOBUTROL 1 MMOL/ML IV SOLN COMPARISON:  Lumbar MRI today reported separately. FINDINGS: Limited cervical spine imaging:  Negative. Thoracic spine segmentation:  Appears normal. Alignment:  Preserved thoracic kyphosis. No spondylolisthesis. Vertebrae: No marrow edema or evidence of acute osseous abnormality. Visualized bone marrow signal is within normal limits. Cord: Normal. Conus medullaris occurs below T11-T12. No abnormal intradural enhancement or dural thickening. Paraspinal and other soft tissues: Negative. Disc levels: Isolated thoracic disc degeneration at T8-T9, but no disc herniation or stenosis. There is mild to moderate bilateral facet degeneration and hypertrophy at T10-T11, with up to mild T10 foraminal stenosis. Mild facet hypertrophy on the left at T4-T5 without stenosis. IMPRESSION: 1. No acute finding and essentially normal for age MRI appearance of the Thoracic Spine. 2. See  abnormal Lumbar MRI reported separately. Electronically Signed   By: HGenevie AnnM.D.   On: 01/17/2022 12:17   MR Lumbar Spine W Wo Contrast  Addendum Date: 01/17/2022   ADDENDUM REPORT: 01/17/2022 12:41 ADDENDUM: Study discussed by telephone with PA AMJAD ALI on 01/17/2022 at 12:36. Electronically Signed   By: HGenevie AnnM.D.   On: 01/17/2022 12:41   Result Date: 01/17/2022 CLINICAL DATA:  56year old male with twisting injury, sudden onset back pain last month. Hyperglycemia (547). Query epidural abscess. EXAM: MRI LUMBAR SPINE WITHOUT AND WITH CONTRAST TECHNIQUE: Multiplanar and multiecho pulse sequences of the lumbar spine were obtained without and with intravenous contrast. CONTRAST:  860mGADAVIST GADOBUTROL 1 MMOL/ML IV SOLN COMPARISON:  Thoracic MRI today reported separately. FINDINGS: Segmentation:  Normal, concordant with the thoracic numbering today. Alignment: Preserved lumbar lordosis. No significant spondylolisthesis. Vertebrae: Confluent abnormal signal and enhancement about the left L4-L5 facet (series 10, image 14). Marrow edema there, most pronounced at the facet joint and in the right L4 pedicle. See additional details below. Background bone marrow signal is within normal limits. Normal visible sacrum and SI joints. No other No acute osseous abnormality identified. Conus medullaris and cauda equina: Conus extends to the T12-L1 level. No lower spinal cord or conus signal abnormality. Capacious spinal canal and in general the cauda equina nerve roots appear normal, and there is no abnormal intradural enhancement. But there is abnormal posterior dural thickening and enhancement at L4 and L5 (series 10, image 10). See additional details of those levels below. Paraspinal and other soft tissues: Abnormal left paraspinal soft tissues beginning at the L1-L2 level and continuing through the L5-S1 level. Confluent muscle edema and enhancement, surrounding a complex posterior left paraspinal fluid collection  centered at L4 and L5, see additional details below. Contralateral right lumbar paraspinal soft tissues are within normal limits. Visible ileo psoas muscles remain normal. Negative visible abdominal viscera. Disc levels: T12-L1 through L2-L3: Negative. L3-L4: Disc desiccation and circumferential disc bulge, but otherwise essentially negative (posterior left paraspinal fluid collection extends up to this level). L4-L5: L4-L5 disc remains normal, but the left facet is highly abnormal. There is abnormal facet joint fluid, the facet is inflamed, and there is intense surrounding abnormal soft tissue inflammation and enhancement. There is a multiloculated  fluid collection extending cephalad and caudal from the level of the facet joint in the left erector spinae muscle encompassing 17 x 22 x 62 mm (AP by transverse by CC), for an estimated volume of 12 mL. See series 10, image 14). And there is mild left lateral epidural space thickening and inflammation adjacent to the left facet (series 7, image 34 and series 11, image 34, with abnormal enhancement continuing into the left L4 nerve level but no discrete epidural abscess at this time. L5-S1: Disc degeneration. The left L5 facets remain normal at this time. IMPRESSION: 1. Septic Arthritis left L4-L5 facet with associated Osteomyelitis, multiloculated left erector spinae Muscle Abscess (12 mL) and intense regional soft tissue edema/inflammation. Inflammation in the left L4 neural foramen. Early inflammation and enhancement in the left lateral epidural space there, and involving the dorsal PACU meninges at L4 and L5. No epidural abscess or spinal stenosis at this time. 2. No superimposed lumbar discitis or other site of infection at this time. Electronically Signed: By: Genevie Ann M.D. On: 01/17/2022 12:25    Scheduled Meds:    insulin aspart  0-15 Units Subcutaneous TID WC   nicotine  21 mg Transdermal Daily   pneumococcal 20-valent conjugate vaccine  0.5 mL  Intramuscular Tomorrow-1000    Continuous Infusions:    0.9 % NaCl with KCl 20 mEq / L 125 mL/hr at 01/17/22 1829    ceFAZolin (ANCEF) IV 2 g (01/18/22 0881)     LOS: 1 day     Vernell Leep, MD,  FACP, [facility], [facility], [facility] (Care Management Physician Certified) [place]  To contact the attending provider between 7A-7P or the covering provider during after hours 7P-7A, please log into the web site [email or web address] and access using universal [place] password for that web site. If you do not have the password, please call the hospital operator.  01/18/2022, 7:54 AM

## 2022-01-18 NOTE — Consult Note (Signed)
[facility]       Reason for Consult: lumbar septic arthritis of left L4-5 facet with osteomyelitis, muscle abscess   Referring Physician: Dr. Waymon AmatoHongalgi  Principal Problem:   Septic arthritis of lumbar spine (HCC) Active Problems:   Tobacco abuse   Drug abuse (HCC)   Type 2 diabetes mellitus with hyperglycemia (HCC)   Hypocalcemia   Normocytic anemia   Hyponatremia   Moderate protein malnutrition (HCC)   Hypomagnesemia    insulin aspart  0-15 Units Subcutaneous TID WC   insulin aspart  0-5 Units Subcutaneous QHS   insulin glargine-yfgn  10 Units Subcutaneous Daily   nicotine  21 mg Transdermal Daily   pneumococcal 20-valent conjugate vaccine  0.5 mL Intramuscular Tomorrow-1000    Recommendations: Continue cefazolin TTE, TEE if nothing noted on TTE Repeat blood cultures HCV RNA  Assessment: He has septic arthritis and osteomyelitis of the lumbar spine.  He will need prolonged IV antibiotics in-house.   Hepatitis C Ab positive so will check RNA.   Antibiotics: cefazolin  HPI: Tanner [name] is a 56 y.o. male with a history of type 2 diabetes with a Hgb A1c of 10.8, tobacco abuse, polysubstance abuse with ongoing use of IV methamphetamine came in with back pain and found to have septic arthritis of the facet joints and osteomyelitis at L4-5 area and muscle abscess.  Now also with MSSA bacteremia.  He is feeling better since admission.  Now on cefazolin IV.  WBC 14.1, no fever.  History of positive hepatitis C antibody.     Review of Systems:  Constitutional: negative for fevers and chills Gastrointestinal: negative for nausea and diarrhea All other systems reviewed and are negative    Past Medical History:  Diagnosis Date   Cellulitis of right foot 05/17/2019   Diabetes mellitus without complication (HCC)    Drug abuse (HCC)    Essential hypertension 09/20/2017   Formatting of this note might be different from the original. Stable with  current therapy; continue  Last Assessment & Plan:  Formatting of this note might be different from the original.   Hepatitis C antibody positive in blood 10/05/2017   Formatting of this note might be different from the original. 10/05/17: ab +, RNA neg on 2/12 labs. Likely past infection that is resolved. F/u prn.   History of vitamin D deficiency 09/20/2017   Formatting of this note might be different from the original. 09/20/17: per pt; labs 1 wk; supplement prn.   Opioid dependence, uncomplicated (HCC) 02/24/2018   Formatting of this note might be different from the original. Self-medicating with buprenorphine Formatting of this note might be different from the original. Self-medicating with buprenorphine   Periodontal disease 10/11/2017   Formatting of this note might be different from the original. 10/11/17: refer to dental   Tobacco abuse    Type 2 diabetes mellitus with hyperglycemia (HCC) 01/17/2022    Social History   Tobacco Use   Smoking status: Every Day    Types: Cigarettes   Smokeless tobacco: Never  Vaping Use   Vaping Use: Some days  Substance Use Topics   Alcohol use: Never   Drug use: Yes    Types: Methamphetamines, IV    Comment: heroin    Family History  Problem Relation Age of Onset   Dementia Father    Diabetes Mellitus II Brother     No Known Allergies  Physical Exam: Constitutional: in no apparent distress  Vitals:  01/18/22 0546 01/18/22 0800  BP: (!) 143/76 (!) 154/80  Pulse: 72 64  Resp: 18 18  Temp: 97.9 F (36.6 C) 98.6 F (37 C)  SpO2: 99% 99%   EYES: anicteric Respiratory: normal respiratory effort Musculoskeletal: no edema Skin: no rash  Lab Results  Component Value Date   WBC 14.1 (H) 01/18/2022   HGB 11.4 (L) 01/18/2022   HCT 32.7 (L) 01/18/2022   MCV 79.8 (L) 01/18/2022   PLT 304 01/18/2022    Lab Results  Component Value Date   CREATININE 0.84 01/18/2022   BUN 20 01/18/2022   NA 125 (L) 01/18/2022   K 4.5 01/18/2022   CL 93  (L) 01/18/2022   CO2 24 01/18/2022    Lab Results  Component Value Date   ALT 15 01/18/2022   AST 15 01/18/2022   ALKPHOS 108 01/18/2022     Microbiology: Recent Results (from the past 240 hour(s))  Blood culture (routine x 2)     Status: None (Preliminary result)   Collection Time: 01/17/22  1:40 PM   Specimen: BLOOD  Result Value Ref Range Status   Specimen Description   Final    BLOOD BLOOD LEFT FOREARM Performed at [facility], [address], [place], [place] [address]    Special Requests   Final    BOTTLES DRAWN AEROBIC AND ANAEROBIC Blood Culture adequate volume Performed at [facility], [address] [place], [place] [address]    Culture  Setup Time   Final    GRAM POSITIVE COCCI ANAEROBIC BOTTLE ONLY IN BOTH AEROBIC AND ANAEROBIC BOTTLES CRITICAL RESULT CALLED TO, READ BACK BY AND VERIFIED WITH: PHARMD MICHELLE LILLISTON 01/18/22@3 :11 BY TW Performed at [facility], [address], [place], [place] [address]    Culture GRAM POSITIVE COCCI  Final   Report Status PENDING  Incomplete  Blood Culture ID Panel (Reflexed)     Status: Abnormal   Collection Time: 01/17/22  1:40 PM  Result Value Ref Range Status   Enterococcus faecalis NOT DETECTED NOT DETECTED Final   Enterococcus Faecium NOT DETECTED NOT DETECTED Final   Listeria monocytogenes NOT DETECTED NOT DETECTED Final   Staphylococcus species DETECTED (A) NOT DETECTED Final    Comment: CRITICAL RESULT CALLED TO, READ BACK BY AND VERIFIED WITH: PHARMD MICHELLE LILLISTON 01/18/22@3 :11 BY TW    Staphylococcus aureus (BCID) DETECTED (A) NOT DETECTED Final    Comment: CRITICAL RESULT CALLED TO, READ BACK BY AND VERIFIED WITH: PHARMD MICHELLE LILLISTON 01/18/22@3 :11 BY TW    Staphylococcus epidermidis NOT DETECTED NOT DETECTED Final   Staphylococcus lugdunensis NOT DETECTED NOT DETECTED Final   Streptococcus species NOT DETECTED NOT DETECTED Final   Streptococcus  agalactiae NOT DETECTED NOT DETECTED Final   Streptococcus pneumoniae NOT DETECTED NOT DETECTED Final   Streptococcus pyogenes NOT DETECTED NOT DETECTED Final   A.calcoaceticus-baumannii NOT DETECTED NOT DETECTED Final   Bacteroides fragilis NOT DETECTED NOT DETECTED Final   Enterobacterales NOT DETECTED NOT DETECTED Final   Enterobacter cloacae complex NOT DETECTED NOT DETECTED Final   Escherichia coli NOT DETECTED NOT DETECTED Final   Klebsiella aerogenes NOT DETECTED NOT DETECTED Final   Klebsiella oxytoca NOT DETECTED NOT DETECTED Final   Klebsiella pneumoniae NOT DETECTED NOT DETECTED Final   Proteus species NOT DETECTED NOT DETECTED Final   Salmonella species NOT DETECTED NOT DETECTED Final   Serratia marcescens NOT DETECTED NOT DETECTED Final   Haemophilus influenzae NOT DETECTED NOT DETECTED Final  Neisseria meningitidis NOT DETECTED NOT DETECTED Final   Pseudomonas aeruginosa NOT DETECTED NOT DETECTED Final   Stenotrophomonas maltophilia NOT DETECTED NOT DETECTED Final   Candida albicans NOT DETECTED NOT DETECTED Final   Candida auris NOT DETECTED NOT DETECTED Final   Candida glabrata NOT DETECTED NOT DETECTED Final   Candida krusei NOT DETECTED NOT DETECTED Final   Candida parapsilosis NOT DETECTED NOT DETECTED Final   Candida tropicalis NOT DETECTED NOT DETECTED Final   Cryptococcus neoformans/gattii NOT DETECTED NOT DETECTED Final   Meth resistant mecA/C and MREJ NOT DETECTED NOT DETECTED Final    Comment: Performed at [facility], [address] [place], [place] [address]  Blood culture (routine x 2)     Status: None (Preliminary result)   Collection Time: 01/17/22  1:50 PM   Specimen: BLOOD  Result Value Ref Range Status   Specimen Description   Final    BLOOD BLOOD RIGHT HAND Performed at [facility], [address], [place], [place] [address]    Special Requests   Final    BOTTLES DRAWN AEROBIC ONLY Blood Culture adequate  volume Performed at [facility], [address], [place], [place] [address]    Culture  Setup Time   Final    GRAM POSITIVE COCCI AEROBIC BOTTLE ONLY CRITICAL VALUE NOTED.  VALUE IS CONSISTENT WITH PREVIOUSLY REPORTED AND CALLED VALUE. Performed at [facility], [address] [place], [place] [address]    Culture GRAM POSITIVE COCCI  Final   Report Status PENDING  Incomplete  Blood culture (routine x 2)     Status: None (Preliminary result)   Collection Time: 01/17/22  2:05 PM   Specimen: BLOOD  Result Value Ref Range Status   Specimen Description   Final    BLOOD RIGHT ANTECUBITAL Performed at [facility], [address], [place], [place] [address]    Special Requests   Final    BOTTLES DRAWN AEROBIC AND ANAEROBIC Blood Culture results may not be optimal due to an excessive volume of blood received in culture bottles Performed at [facility], [address], [place], [place] [address]    Culture  Setup Time   Final    GRAM POSITIVE COCCI AEROBIC BOTTLE ONLY CRITICAL VALUE NOTED.  VALUE IS CONSISTENT WITH PREVIOUSLY REPORTED AND CALLED VALUE. IN BOTH AEROBIC AND ANAEROBIC BOTTLES Performed at [facility], [address], [place], [place] [address]    Culture GRAM POSITIVE COCCI  Final   Report Status PENDING  Incomplete    Gardiner Barefoot, MD [facility] for Infectious Disease [facility] [email or web address] 01/18/2022, 12:33 PM

## 2022-01-18 NOTE — Progress Notes (Signed)
  Echocardiogram 2D Echocardiogram has been performed.  Tanner [name] 01/18/2022, 2:03 PM

## 2022-01-18 NOTE — Progress Notes (Addendum)
Inpatient Diabetes Program Recommendations  AACE/ADA: New Consensus Statement on Inpatient Glycemic Control (2015)  Target Ranges:  Prepandial:   less than 140 mg/dL      Peak postprandial:   less than 180 mg/dL (1-2 hours)      Critically ill patients:  140 - 180 mg/dL    Latest Reference Range & Units 01/17/22 15:42 01/17/22 16:50 01/17/22 21:05 01/18/22 07:30  Glucose-Capillary 70 - 99 mg/dL 213 (H)  15 units Novolog @1625  352 (H)   347 (H) 347 (H)  11 units Novolog   (H): Data is abnormally high  Latest Reference Range & Units 01/18/22 03:31  Hemoglobin A1C 4.8 - 5.6 % 10.8 (H)  (H): Data is abnormally high   Admit with:  Septic arthritis of L4-5 facet with associated osteomyelitis and multiloculated left erector spinae muscle abscess, secondary to IVDA with MSSA bacteremia  History: DM, Polysubstance Abuse  Home DM Meds: Metformin 1000 mg BID (NOT taking)  Current Orders: Semglee 10 units Daily     Novolog Moderate Correction Scale/ SSI (0-15 units) TID AC + HS    Note Semglee to start this AM.  Agree with current orders.  Current A1c= 10.8%  Of note, pt admits to NOT taking Metformin at home--Not sure Insulin would be a safe option for him given his IV drug abuse issues.  Perhaps we could restart Metformin and add low dose Sulfonylurea like Amaryl 2 mg daily?     --Will follow patient during hospitalization--  03/20/22 RN, MSN, CDE Diabetes Coordinator Inpatient Glycemic Control Team Team Pager: [phone] (8a-5p)

## 2022-01-19 DIAGNOSIS — E871 Hypo-osmolality and hyponatremia: Secondary | ICD-10-CM

## 2022-01-19 DIAGNOSIS — M4620 Osteomyelitis of vertebra, site unspecified: Secondary | ICD-10-CM

## 2022-01-19 DIAGNOSIS — B9561 Methicillin susceptible Staphylococcus aureus infection as the cause of diseases classified elsewhere: Secondary | ICD-10-CM

## 2022-01-19 DIAGNOSIS — M545 Low back pain, unspecified: Secondary | ICD-10-CM

## 2022-01-19 DIAGNOSIS — D649 Anemia, unspecified: Secondary | ICD-10-CM

## 2022-01-19 LAB — BASIC METABOLIC PANEL
Anion gap: 7 (ref 5–15)
BUN: 13 mg/dL (ref 6–20)
CO2: 24 mmol/L (ref 22–32)
Calcium: 7.9 mg/dL — ABNORMAL LOW (ref 8.9–10.3)
Chloride: 98 mmol/L (ref 98–111)
Creatinine, Ser: 0.69 mg/dL (ref 0.61–1.24)
GFR, Estimated: >60 mL/min (ref >60)
Glucose, Bld: 202 mg/dL — ABNORMAL HIGH (ref 70–99)
Potassium: 3.8 mmol/L (ref 3.5–5.1)
Sodium: 129 mmol/L — ABNORMAL LOW (ref 135–145)

## 2022-01-19 LAB — CBC
HCT: 33.6 % — ABNORMAL LOW (ref 39.0–52.0)
Hemoglobin: 11.8 g/dL — ABNORMAL LOW (ref 13.0–17.0)
MCH: 27.9 pg (ref 26.0–34.0)
MCHC: 35.1 g/dL (ref 30.0–36.0)
MCV: 79.4 fL — ABNORMAL LOW (ref 80.0–100.0)
Platelets: 325 10*3/uL (ref 150–400)
RBC: 4.23 MIL/uL (ref 4.22–5.81)
RDW: 13.2 % (ref 11.5–15.5)
WBC: 13.6 10*3/uL — ABNORMAL HIGH (ref 4.0–10.5)
nRBC: 0 % (ref 0.0–0.2)

## 2022-01-19 LAB — GLUCOSE, CAPILLARY
Glucose-Capillary: 348 mg/dL — ABNORMAL HIGH (ref 70–99)
Glucose-Capillary: 142 mg/dL — ABNORMAL HIGH (ref 70–99)
Glucose-Capillary: 125 mg/dL — ABNORMAL HIGH (ref 70–99)
Glucose-Capillary: 152 mg/dL — ABNORMAL HIGH (ref 70–99)
Glucose-Capillary: 254 mg/dL — ABNORMAL HIGH (ref 70–99)

## 2022-01-19 MED ORDER — ADULT MULTIVITAMIN W/MINERALS CH
1.0000 | ORAL_TABLET | Freq: Every day | ORAL | Status: DC
  Administered 2022-01-19 – 2022-02-19 (×32): 1 via ORAL
  Filled 2022-01-19 (×32): qty 1

## 2022-01-19 MED ORDER — TRAZODONE HCL 50 MG PO TABS
50.0000 mg | ORAL_TABLET | Freq: Every day | ORAL | Status: DC
  Administered 2022-01-19 – 2022-01-23 (×5): 50 mg via ORAL
  Filled 2022-01-19 (×7): qty 1

## 2022-01-19 MED ORDER — ENSURE MAX PROTEIN PO LIQD
11.0000 [oz_av] | Freq: Two times a day (BID) | ORAL | Status: DC
  Administered 2022-01-19 – 2022-02-13 (×31): 11 [oz_av] via ORAL
  Administered 2022-02-14: 237 mL via ORAL
  Administered 2022-02-14 – 2022-02-19 (×10): 11 [oz_av] via ORAL
  Filled 2022-01-19: qty 330

## 2022-01-19 NOTE — Progress Notes (Signed)
24 hour chart audit completed 

## 2022-01-19 NOTE — Progress Notes (Signed)
[facility]  PROGRESS NOTE  [name] EXB:[id number] DOB: April 10, 1966 DOA: 01/17/2022 PCP: Tanner [name], No Pcp Per (Inactive)   Brief HPI:    56 year old male, single, independent, currently unemployed, medical history significant for polysubstance abuse (tobacco, IVDA-crystal meth), type II DM, HTN, hepatitis C, cellulitis of right foot, opioid dependence, periodontal disease presented to the ED for the second time in a week with complaints of progressively worsening low back pain x2 weeks.  MRI L-spine confirms L4-5 facet septic arthritis with associated osteomyelitis and multiloculated left erector spinae muscle abscess (12 mL). BCID positive for MSSA.    Neurosurgery and ID consulted.  Currently on empiric IV cefazolin.    Subjective   Tanner [name] seen and examined, TTE done yesterday shows possibility of vegetation on tricuspid valve.   Assessment/Plan:    Septic arthritis/osteomyelitis of L4-5 facet.  Multiloculated left erector spinae muscle abscess -Secondary to IV drug use with MSSA bacteremia -TTE shows possible vegetation on tricuspid valve -Currently on IV cefazolin -Neurosurgery was consulted, no plan for surgical intervention  MSSA bacteremia -TTE confirms possibility of tricuspid valve vegetation -ID recommends to continue with IV cefazolin, no indication for TEE as Tanner [name] is going to be on prolonged antibiotics course due to septic arthritis  Diabetes mellitus type 2 -CBG well controlled -Continue Lantus 10 units subcu daily -Sliding scale insulin with NovoLog -Hemoglobin A1c 10.8  Hyponatremia -Sodium is slowly improving -Today sodium is 129 -Likely SIADH -Start fluid restriction 1.5 L/day  Microcytic anemia -Hemoglobin stable at 11.8 -We will need further evaluation with anemia panel once infection subsides  Hypomagnesemia -Replete  Moderate protein calorie malnutrition -Dietitian consulted  Polysubstance abuse -Tanner [name] did IV crystal meth 4 to  5 days prior to admission -Cessation counseling provided -Continue nicotine patch   Medications     insulin aspart  0-15 Units Subcutaneous TID WC   insulin aspart  0-5 Units Subcutaneous QHS   insulin glargine-yfgn  10 Units Subcutaneous Daily   multivitamin with minerals  1 tablet Oral Daily   nicotine  21 mg Transdermal Daily   pneumococcal 20-valent conjugate vaccine  0.5 mL Intramuscular Tomorrow-1000   Ensure Max Protein  11 oz Oral BID   traZODone  50 mg Oral QHS     Data Reviewed:   CBG:  Recent Labs  Lab 01/18/22 1629 01/18/22 2135 01/19/22 0755 01/19/22 1157 01/19/22 1417  GLUCAP 211* 177* 348* 142* 125*    SpO2: 99 %    Vitals:   01/18/22 1300 01/18/22 2129 01/19/22 0612 01/19/22 1336  BP: 123/89 (!) 145/81 (!) 146/80 136/84  Pulse: 68 70 66 62  Resp: 16 18 16 18   Temp: 98.4 F (36.9 C) 98.9 F (37.2 C) 98.6 F (37 C) 98.4 F (36.9 C)  TempSrc: Oral Oral Oral Oral  SpO2: 98% 99% 97% 99%  Weight:      Height:          Data Reviewed:  Basic Metabolic Panel: Recent Labs  Lab 01/17/22 0722 01/18/22 0331 01/19/22 0939  NA 124* 125* 129*  K 3.7 4.5 3.8  CL 88* 93* 98  CO2 25 24 24   GLUCOSE 460* 324* 202*  BUN 20 20 13   CREATININE 0.84 0.84 0.69  CALCIUM 8.5* 7.8* 7.9*  MG 1.6* 1.9  --   PHOS 3.0  --   --     CBC: Recent Labs  Lab 01/17/22 0722 01/18/22 0331 01/19/22 0939  WBC 11.3* 14.1* 13.6*  NEUTROABS 9.5*  --   --  HGB 11.1* 11.4* 11.8*  HCT 31.4* 32.7* 33.6*  MCV 78.9* 79.8* 79.4*  PLT 258 304 325    LFT Recent Labs  Lab 01/17/22 0722 01/18/22 0331  AST 16 15  ALT 16 15  ALKPHOS 109 108  BILITOT 0.8 0.8  PROT 6.8 6.1*  ALBUMIN 2.7* 2.4*     Antibiotics: Anti-infectives (From admission, onward)    Start     Dose/Rate Route Frequency Ordered Stop   01/18/22 0600  ceFAZolin (ANCEF) IVPB 2g/100 mL premix        2 g 200 mL/hr over 30 Minutes Intravenous Every 8 hours 01/18/22 0330     01/18/22 0200   vancomycin (VANCOREADY) IVPB 1250 mg/250 mL  Status:  Discontinued        1,250 mg 166.7 mL/hr over 90 Minutes Intravenous Every 12 hours 01/17/22 1741 01/18/22 0330   01/17/22 2200  ceFEPIme (MAXIPIME) 2 g in sodium chloride 0.9 % 100 mL IVPB  Status:  Discontinued        2 g 200 mL/hr over 30 Minutes Intravenous Every 8 hours 01/17/22 1645 01/18/22 0330   01/17/22 1400  vancomycin (VANCOREADY) IVPB 1500 mg/300 mL        1,500 mg 150 mL/hr over 120 Minutes Intravenous  Once 01/17/22 1323 01/17/22 1629   01/17/22 1330  ceFEPIme (MAXIPIME) 2 g in sodium chloride 0.9 % 100 mL IVPB        2 g 200 mL/hr over 30 Minutes Intravenous  Once 01/17/22 1323 01/17/22 1629        DVT prophylaxis: SCDs  Code Status: Full code  Family Communication: No family at bedside   CONSULTS neurosurgery, infectious disease   Objective    Physical Examination:   General-appears in no acute distress Heart-S1-S2, regular, no murmur auscultated Lungs-clear to auscultation bilaterally, no wheezing or crackles auscultated Abdomen-soft, nontender, no organomegaly Extremities-no edema in the lower extremities Neuro-alert, oriented x3, no focal deficit noted  Status is: Inpatient: On IV antibiotics for lumbar septic arthritis/abscess        Meredeth Ide   [facility] If 7PM-7AM, please contact night-coverage at [email or web address], Office  [phone]   01/19/2022, 4:12 PM  LOS: 2 days

## 2022-01-19 NOTE — Progress Notes (Signed)
Initial Nutrition Assessment  INTERVENTION:   -Ensure MAX Protein po BID, each supplement provides 150 kcal and 30 grams of protein   -Multivitamin with minerals daily  -Liberalized diet from Heart Healthy CHO modified to CHO modified to provide more menu options.   NUTRITION DIAGNOSIS:   Increased nutrient needs related to acute illness (polysubstance abuse) as evidenced by estimated needs.  GOAL:   Patient will meet greater than or equal to 90% of their needs  MONITOR:   PO intake, Supplement acceptance, Weight trends, Labs, I & O's, Skin  REASON FOR ASSESSMENT:   Consult Assessment of nutrition requirement/status  ASSESSMENT:   56 year old male, single, independent, currently unemployed, medical history significant for polysubstance abuse (tobacco, IVDA-crystal meth), type II DM, HTN, hepatitis C, cellulitis of right foot, opioid dependence, periodontal disease presented to the ED for the second time in a week with complaints of progressively worsening low back pain x2 weeks.  MRI L-spine confirms L4-5 facet septic arthritis with associated osteomyelitis and multiloculated left erector spinae muscle abscess (12 mL).  Patient in room, very flat in affect. States he has not ordered any lunch yet. Placed order for patient.  PO intakes have been 75-100%.  Pt denies having a history of DM. Noted in chart history of type 2 DM as well as polysubstance abuse. Liberalized to just CHO modified, taking out heart healthy restrictions.  Pt agreeable to receiving Ensure supplements, Ensure Max ordered.  Per patient, UBW ~180 lbs. Pt has lost 7 lbs since 3/9 (3% wt loss x 3.5 months, insignificant for time frame).  Medications reviewed.  Labs reviewed:  CBGs: 142-348 Low Na  NUTRITION - FOCUSED PHYSICAL EXAM:  No depletions noted.  Diet Order:   Diet Order             Diet Carb Modified Fluid consistency: Thin; Room service appropriate? Yes  Diet effective now                    EDUCATION NEEDS:   Not appropriate for education at this time  Skin:  Skin Assessment: Skin Integrity Issues: Skin Integrity Issues:: Other (Comment) Other: cellulitis of right foot  Last BM:  6/3  Height:   Ht Readings from Last 1 Encounters:  01/17/22 6' (1.829 m)    Weight:   Wt Readings from Last 1 Encounters:  01/17/22 77 kg    BMI:  Body mass index is 23.02 kg/m.  Estimated Nutritional Needs:   Kcal:  2300-2500  Protein:  115-130g  Fluid:  2.3L/day  Tilda Franco, MS, RD, LDN Inpatient Clinical Dietitian Contact information available via Amion

## 2022-01-19 NOTE — Progress Notes (Signed)
    [facility] [facility]   Reason for visit: Follow up on bacteremia  Interval History: TTE concerning for tricuspid valve vegetation.    Physical Exam: Constitutional:  Vitals:   01/19/22 0612 01/19/22 1336  BP: (!) 146/80 136/84  Pulse: 66 62  Resp: 16 18  Temp: 98.6 F (37 C) 98.4 F (36.9 C)  SpO2: 97% 99%   patient appears in NAD  Impression: facet arthritis of lumbar region.    Plan: 1.  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  With a TV, I do not think there is an absolute indication for TEE with concurrent septic arthritis of the facet joints since he will need prolonged treatment regardless.    Continue with cefazolin

## 2022-01-19 NOTE — Consult Note (Signed)
Reason for Consult: Lumbar spinal infection Referring Physician: Dr. Tresa GarterHongalgi  Tanner [name] is an 56 y.o. male.  HPI: Patient is a 56 year old right-handed individual who has a complicated medical history including diabetes and polysubstance abuse who now presents with sepsis.  He has evidence of infection involving the lumbar spine in the region of the facet joint and an abscess in the erector spinal muscle is no evidence of any epidural abscess at this time or intraspinal infection per se.  He does complain of significant back pain.  The patient has had cultures taken organism is being isolated.  He has been started on broad-spectrum antibiotics.  Past Medical History:  Diagnosis Date   Cellulitis of right foot 05/17/2019   Diabetes mellitus without complication (HCC)    Drug abuse (HCC)    Essential hypertension 09/20/2017   Formatting of this note might be different from the original. Stable with current therapy; continue  Last Assessment & Plan:  Formatting of this note might be different from the original.   Hepatitis C antibody positive in blood 10/05/2017   Formatting of this note might be different from the original. 10/05/17: ab +, RNA neg on 2/12 labs. Likely past infection that is resolved. F/u prn.   History of vitamin D deficiency 09/20/2017   Formatting of this note might be different from the original. 09/20/17: per pt; labs 1 wk; supplement prn.   Opioid dependence, uncomplicated (HCC) 02/24/2018   Formatting of this note might be different from the original. Self-medicating with buprenorphine Formatting of this note might be different from the original. Self-medicating with buprenorphine   Periodontal disease 10/11/2017   Formatting of this note might be different from the original. 10/11/17: refer to dental   Tobacco abuse    Type 2 diabetes mellitus with hyperglycemia (HCC) 01/17/2022    Past Surgical History:  Procedure Laterality Date   SKIN GRAFT      Family History  Problem  Relation Age of Onset   Dementia Father    Diabetes Mellitus II Brother     Social History:  reports that he has been smoking cigarettes. He has never used smokeless tobacco. He reports current drug use. Drugs: Methamphetamines and IV. He reports that he does not drink alcohol.  Allergies: No Known Allergies  Medications: I have reviewed the patient's current medications.  Results for orders placed or performed during the hospital encounter of 01/17/22 (from the past 48 hour(s))  CBC with Differential     Status: Abnormal   Collection Time: 01/17/22  7:22 AM  Result Value Ref Range   WBC 11.3 (H) 4.0 - 10.5 K/uL   RBC 3.98 (L) 4.22 - 5.81 MIL/uL   Hemoglobin 11.1 (L) 13.0 - 17.0 g/dL   HCT 16.131.4 (L) 09.639.0 - 04.552.0 %   MCV 78.9 (L) 80.0 - 100.0 fL   MCH 27.9 26.0 - 34.0 pg   MCHC 35.4 30.0 - 36.0 g/dL   RDW 40.913.0 81.111.5 - 91.415.5 %   Platelets 258 150 - 400 K/uL   nRBC 0.0 0.0 - 0.2 %   Neutrophils Relative % 84 %   Neutro Abs 9.5 (H) 1.7 - 7.7 K/uL   Lymphocytes Relative 7 %   Lymphs Abs 0.8 0.7 - 4.0 K/uL   Monocytes Relative 8 %   Monocytes Absolute 0.9 0.1 - 1.0 K/uL   Eosinophils Relative 0 %   Eosinophils Absolute 0.0 0.0 - 0.5 K/uL   Basophils Relative 0 %   Basophils Absolute 0.0  0.0 - 0.1 K/uL   Immature Granulocytes 1 %   Abs Immature Granulocytes 0.11 (H) 0.00 - 0.07 K/uL    Comment: Performed at [facility], [address] [place], [place] [address]  Basic metabolic panel     Status: Abnormal   Collection Time: 01/17/22  7:22 AM  Result Value Ref Range   Sodium 124 (L) 135 - 145 mmol/L   Potassium 3.7 3.5 - 5.1 mmol/L   Chloride 88 (L) 98 - 111 mmol/L   CO2 25 22 - 32 mmol/L   Glucose, Bld 460 (H) 70 - 99 mg/dL    Comment: Glucose reference range applies only to samples taken after fasting for at least 8 hours.   BUN 20 6 - 20 mg/dL   Creatinine, Ser 6.04 0.61 - 1.24 mg/dL   Calcium 8.5 (L) 8.9 - 10.3 mg/dL   GFR, Estimated >54 >09 mL/min     Comment: (NOTE) Calculated using the CKD-EPI Creatinine Equation (2021)    Anion gap 11 5 - 15    Comment: Performed at [facility], [address], [place], [place] [address]  Hepatic function panel     Status: Abnormal   Collection Time: 01/17/22  7:22 AM  Result Value Ref Range   Total Protein 6.8 6.5 - 8.1 g/dL   Albumin 2.7 (L) 3.5 - 5.0 g/dL   AST 16 15 - 41 U/L   ALT 16 0 - 44 U/L   Alkaline Phosphatase 109 38 - 126 U/L   Total Bilirubin 0.8 0.3 - 1.2 mg/dL   Bilirubin, Direct 0.1 0.0 - 0.2 mg/dL   Indirect Bilirubin 0.7 0.3 - 0.9 mg/dL    Comment: Performed at [facility], [address], [place], [place] [address]  Magnesium     Status: Abnormal   Collection Time: 01/17/22  7:22 AM  Result Value Ref Range   Magnesium 1.6 (L) 1.7 - 2.4 mg/dL    Comment: Performed at [facility], [address] [place], [place] [address]  Phosphorus     Status: None   Collection Time: 01/17/22  7:22 AM  Result Value Ref Range   Phosphorus 3.0 2.5 - 4.6 mg/dL    Comment: Performed at [facility], [address] [place], [place] [address]  Lactic acid, plasma     Status: None   Collection Time: 01/17/22  1:05 PM  Result Value Ref Range   Lactic Acid, Venous 1.4 0.5 - 1.9 mmol/L    Comment: Performed at [facility], [address] [place], [place] [address]  Protime-INR     Status: None   Collection Time: 01/17/22  1:05 PM  Result Value Ref Range   Prothrombin Time 13.7 11.4 - 15.2 seconds   INR 1.1 0.8 - 1.2    Comment: (NOTE) INR goal varies based on device and disease states. Performed at [facility], [address] [place], [place] [address]   APTT     Status: None   Collection Time: 01/17/22  1:05 PM  Result Value Ref Range   aPTT 31 24 - 36 seconds    Comment: Performed at [facility], [address], [place], [place] [address]  Blood  culture (routine x 2)     Status: None (Preliminary result)   Collection Time: 01/17/22  1:40 PM   Specimen: BLOOD  Result Value Ref Range   Specimen Description  BLOOD BLOOD LEFT FOREARM Performed at [facility], [address], [place], [place] [address]    Special Requests      BOTTLES DRAWN AEROBIC AND ANAEROBIC Blood Culture adequate volume Performed at [facility], [address], [place], [place] [address]    Culture  Setup Time      GRAM POSITIVE COCCI ANAEROBIC BOTTLE ONLY IN BOTH AEROBIC AND ANAEROBIC BOTTLES CRITICAL RESULT CALLED TO, READ BACK BY AND VERIFIED WITH: PHARMD MICHELLE LILLISTON 01/18/22@3 :11 BY TW Performed at [facility], [address], [place], [place] [address]    Culture GRAM POSITIVE COCCI    Report Status PENDING   Blood Culture ID Panel (Reflexed)     Status: Abnormal   Collection Time: 01/17/22  1:40 PM  Result Value Ref Range   Enterococcus faecalis NOT DETECTED NOT DETECTED   Enterococcus Faecium NOT DETECTED NOT DETECTED   Listeria monocytogenes NOT DETECTED NOT DETECTED   Staphylococcus species DETECTED (A) NOT DETECTED    Comment: CRITICAL RESULT CALLED TO, READ BACK BY AND VERIFIED WITH: PHARMD MICHELLE LILLISTON 01/18/22@3 :11 BY TW    Staphylococcus aureus (BCID) DETECTED (A) NOT DETECTED    Comment: CRITICAL RESULT CALLED TO, READ BACK BY AND VERIFIED WITH: PHARMD MICHELLE LILLISTON 01/18/22@3 :11 BY TW    Staphylococcus epidermidis NOT DETECTED NOT DETECTED   Staphylococcus lugdunensis NOT DETECTED NOT DETECTED   Streptococcus species NOT DETECTED NOT DETECTED   Streptococcus agalactiae NOT DETECTED NOT DETECTED   Streptococcus pneumoniae NOT DETECTED NOT DETECTED   Streptococcus pyogenes NOT DETECTED NOT DETECTED   A.calcoaceticus-baumannii NOT DETECTED NOT DETECTED   Bacteroides fragilis NOT DETECTED NOT DETECTED   Enterobacterales NOT DETECTED NOT DETECTED   Enterobacter cloacae  complex NOT DETECTED NOT DETECTED   Escherichia coli NOT DETECTED NOT DETECTED   Klebsiella aerogenes NOT DETECTED NOT DETECTED   Klebsiella oxytoca NOT DETECTED NOT DETECTED   Klebsiella pneumoniae NOT DETECTED NOT DETECTED   Proteus species NOT DETECTED NOT DETECTED   Salmonella species NOT DETECTED NOT DETECTED   Serratia marcescens NOT DETECTED NOT DETECTED   Haemophilus influenzae NOT DETECTED NOT DETECTED   Neisseria meningitidis NOT DETECTED NOT DETECTED   Pseudomonas aeruginosa NOT DETECTED NOT DETECTED   Stenotrophomonas maltophilia NOT DETECTED NOT DETECTED   Candida albicans NOT DETECTED NOT DETECTED   Candida auris NOT DETECTED NOT DETECTED   Candida glabrata NOT DETECTED NOT DETECTED   Candida krusei NOT DETECTED NOT DETECTED   Candida parapsilosis NOT DETECTED NOT DETECTED   Candida tropicalis NOT DETECTED NOT DETECTED   Cryptococcus neoformans/gattii NOT DETECTED NOT DETECTED   Meth resistant mecA/C and MREJ NOT DETECTED NOT DETECTED    Comment: Performed at [facility], [address] [place], [place] [address]  Blood culture (routine x 2)     Status: None (Preliminary result)   Collection Time: 01/17/22  1:50 PM   Specimen: BLOOD  Result Value Ref Range   Specimen Description      BLOOD BLOOD RIGHT HAND Performed at [facility], [address] [place], [place] [address]    Special Requests      BOTTLES DRAWN AEROBIC ONLY Blood Culture adequate volume Performed at [facility], [address], [place], [place] [address]    Culture  Setup Time      GRAM POSITIVE COCCI AEROBIC BOTTLE ONLY CRITICAL VALUE NOTED.  VALUE IS CONSISTENT WITH PREVIOUSLY REPORTED AND CALLED VALUE. Performed at [facility], [address] [place], [place] [address]    Culture GRAM POSITIVE COCCI    Report Status PENDING   Blood culture (routine x 2)     Status: None (Preliminary result)   Collection Time: 01/17/22  2:05 PM    Specimen: BLOOD  Result Value Ref Range   Specimen Description      BLOOD RIGHT ANTECUBITAL Performed at [facility], [address], [place], [place] [address]    Special Requests      BOTTLES DRAWN AEROBIC AND ANAEROBIC Blood Culture results may not be optimal due to an excessive volume of blood received in culture bottles Performed at [facility], [address], [place], [place] [address]    Culture  Setup Time      GRAM POSITIVE COCCI AEROBIC BOTTLE ONLY CRITICAL VALUE NOTED.  VALUE IS CONSISTENT WITH PREVIOUSLY REPORTED AND CALLED VALUE. IN BOTH AEROBIC AND ANAEROBIC BOTTLES Performed at [facility], [address] [place], [place] [address]    Culture GRAM POSITIVE COCCI    Report Status PENDING   POC CBG, ED     Status: Abnormal   Collection Time: 01/17/22  3:42 PM  Result Value Ref Range   Glucose-Capillary 352 (H) 70 - 99 mg/dL    Comment: Glucose reference range applies only to samples taken after fasting for at least 8 hours.  Glucose, capillary     Status: Abnormal   Collection Time: 01/17/22  4:50 PM  Result Value Ref Range   Glucose-Capillary 352 (H) 70 - 99 mg/dL    Comment: Glucose reference range applies only to samples taken after fasting for at least 8 hours.  Lactic acid, plasma     Status: None   Collection Time: 01/17/22  6:21 PM  Result Value Ref Range   Lactic Acid, Venous 1.6 0.5 - 1.9 mmol/L    Comment: Performed at [facility], [address] [place], [place] [address]  Glucose, capillary     Status: Abnormal   Collection Time: 01/17/22  9:05 PM  Result Value Ref Range   Glucose-Capillary 347 (H) 70 - 99 mg/dL    Comment: Glucose reference range applies only to samples taken after fasting for at least 8 hours.  HIV Antibody (routine testing w rflx)     Status: None   Collection Time: 01/18/22  3:31 AM  Result Value Ref Range   HIV Screen 4th Generation wRfx Non Reactive Non  Reactive    Comment: Performed at [facility], [address] [place], [place] [address]  CBC     Status: Abnormal   Collection Time: 01/18/22  3:31 AM  Result Value Ref Range   WBC 14.1 (H) 4.0 - 10.5 K/uL   RBC 4.10 (L) 4.22 - 5.81 MIL/uL   Hemoglobin 11.4 (L) 13.0 - 17.0 g/dL   HCT 92.0 (L) 10.0 - 71.2 %   MCV 79.8 (L) 80.0 - 100.0 fL   MCH 27.8 26.0 - 34.0 pg   MCHC 34.9 30.0 - 36.0 g/dL   RDW 19.7 58.8 - 32.5 %   Platelets 304 150 - 400 K/uL   nRBC 0.0 0.0 - 0.2 %    Comment: Performed at [facility], [address] [place], [place] [address]  Comprehensive metabolic panel     Status: Abnormal   Collection Time: 01/18/22  3:31 AM  Result Value Ref Range   Sodium 125 (L) 135 - 145 mmol/L  Potassium 4.5 3.5 - 5.1 mmol/L    Comment: DELTA CHECK NOTED   Chloride 93 (L) 98 - 111 mmol/L   CO2 24 22 - 32 mmol/L   Glucose, Bld 324 (H) 70 - 99 mg/dL    Comment: Glucose reference range applies only to samples taken after fasting for at least 8 hours.   BUN 20 6 - 20 mg/dL   Creatinine, Ser 1.61 0.61 - 1.24 mg/dL   Calcium 7.8 (L) 8.9 - 10.3 mg/dL   Total Protein 6.1 (L) 6.5 - 8.1 g/dL   Albumin 2.4 (L) 3.5 - 5.0 g/dL   AST 15 15 - 41 U/L   ALT 15 0 - 44 U/L   Alkaline Phosphatase 108 38 - 126 U/L   Total Bilirubin 0.8 0.3 - 1.2 mg/dL   GFR, Estimated >09 >60 mL/min    Comment: (NOTE) Calculated using the CKD-EPI Creatinine Equation (2021)    Anion gap 8 5 - 15    Comment: Performed at [facility], [address] [place], [place] [address]  Hemoglobin A1c     Status: Abnormal   Collection Time: 01/18/22  3:31 AM  Result Value Ref Range   Hgb A1c MFr Bld 10.8 (H) 4.8 - 5.6 %    Comment: (NOTE) Pre diabetes:          5.7%-6.4%  Diabetes:              >6.4%  Glycemic control for   <7.0% adults with diabetes    Mean Plasma Glucose 263.26 mg/dL    Comment: Performed at [facility], [address] [place],  [place] [address]  Sedimentation rate     Status: Abnormal   Collection Time: 01/18/22  3:31 AM  Result Value Ref Range   Sed Rate 50 (H) 0 - 16 mm/hr    Comment: Performed at [facility], [address], [place], [place] [address]  C-reactive protein     Status: Abnormal   Collection Time: 01/18/22  3:31 AM  Result Value Ref Range   CRP 20.5 (H) <1.0 mg/dL    Comment: Performed at [facility], [address] [place], [place] [address]  Magnesium     Status: None   Collection Time: 01/18/22  3:31 AM  Result Value Ref Range   Magnesium 1.9 1.7 - 2.4 mg/dL    Comment: Performed at [facility], [address], [place], [place] [address]  Glucose, capillary     Status: Abnormal   Collection Time: 01/18/22  7:30 AM  Result Value Ref Range   Glucose-Capillary 347 (H) 70 - 99 mg/dL    Comment: Glucose reference range applies only to samples taken after fasting for at least 8 hours.  Glucose, capillary     Status: Abnormal   Collection Time: 01/18/22 11:24 AM  Result Value Ref Range   Glucose-Capillary 300 (H) 70 - 99 mg/dL    Comment: Glucose reference range applies only to samples taken after fasting for at least 8 hours.  Glucose, capillary     Status: Abnormal   Collection Time: 01/18/22  4:29 PM  Result Value Ref Range   Glucose-Capillary 211 (H) 70 - 99 mg/dL    Comment: Glucose reference range applies only to samples taken after fasting for at least 8 hours.  Glucose, capillary     Status: Abnormal   Collection Time: 01/18/22  9:35 PM  Result Value Ref Range  Glucose-Capillary 177 (H) 70 - 99 mg/dL    Comment: Glucose reference range applies only to samples taken after fasting for at least 8 hours.    MR THORACIC SPINE W WO CONTRAST  Result Date: 01/17/2022 CLINICAL DATA:  56 year old male with twisting injury, sudden onset back pain last month. Hyperglycemia (547). Query epidural abscess. EXAM: MRI THORACIC WITHOUT AND WITH CONTRAST  TECHNIQUE: Multiplanar and multiecho pulse sequences of the thoracic spine were obtained without and with intravenous contrast. CONTRAST:  8mL GADAVIST GADOBUTROL 1 MMOL/ML IV SOLN COMPARISON:  Lumbar MRI today reported separately. FINDINGS: Limited cervical spine imaging:  Negative. Thoracic spine segmentation:  Appears normal. Alignment:  Preserved thoracic kyphosis. No spondylolisthesis. Vertebrae: No marrow edema or evidence of acute osseous abnormality. Visualized bone marrow signal is within normal limits. Cord: Normal. Conus medullaris occurs below T11-T12. No abnormal intradural enhancement or dural thickening. Paraspinal and other soft tissues: Negative. Disc levels: Isolated thoracic disc degeneration at T8-T9, but no disc herniation or stenosis. There is mild to moderate bilateral facet degeneration and hypertrophy at T10-T11, with up to mild T10 foraminal stenosis. Mild facet hypertrophy on the left at T4-T5 without stenosis. IMPRESSION: 1. No acute finding and essentially normal for age MRI appearance of the Thoracic Spine. 2. See abnormal Lumbar MRI reported separately. Electronically Signed   By: Odessa Fleming M.D.   On: 01/17/2022 12:17   MR Lumbar Spine W Wo Contrast  Addendum Date: 01/17/2022   ADDENDUM REPORT: 01/17/2022 12:41 ADDENDUM: Study discussed by telephone with PA AMJAD ALI on 01/17/2022 at 12:36. Electronically Signed   By: Odessa Fleming M.D.   On: 01/17/2022 12:41   Result Date: 01/17/2022 CLINICAL DATA:  56 year old male with twisting injury, sudden onset back pain last month. Hyperglycemia (547). Query epidural abscess. EXAM: MRI LUMBAR SPINE WITHOUT AND WITH CONTRAST TECHNIQUE: Multiplanar and multiecho pulse sequences of the lumbar spine were obtained without and with intravenous contrast. CONTRAST:  8mL GADAVIST GADOBUTROL 1 MMOL/ML IV SOLN COMPARISON:  Thoracic MRI today reported separately. FINDINGS: Segmentation:  Normal, concordant with the thoracic numbering today. Alignment: Preserved  lumbar lordosis. No significant spondylolisthesis. Vertebrae: Confluent abnormal signal and enhancement about the left L4-L5 facet (series 10, image 14). Marrow edema there, most pronounced at the facet joint and in the right L4 pedicle. See additional details below. Background bone marrow signal is within normal limits. Normal visible sacrum and SI joints. No other No acute osseous abnormality identified. Conus medullaris and cauda equina: Conus extends to the T12-L1 level. No lower spinal cord or conus signal abnormality. Capacious spinal canal and in general the cauda equina nerve roots appear normal, and there is no abnormal intradural enhancement. But there is abnormal posterior dural thickening and enhancement at L4 and L5 (series 10, image 10). See additional details of those levels below. Paraspinal and other soft tissues: Abnormal left paraspinal soft tissues beginning at the L1-L2 level and continuing through the L5-S1 level. Confluent muscle edema and enhancement, surrounding a complex posterior left paraspinal fluid collection centered at L4 and L5, see additional details below. Contralateral right lumbar paraspinal soft tissues are within normal limits. Visible ileo psoas muscles remain normal. Negative visible abdominal viscera. Disc levels: T12-L1 through L2-L3: Negative. L3-L4: Disc desiccation and circumferential disc bulge, but otherwise essentially negative (posterior left paraspinal fluid collection extends up to this level). L4-L5: L4-L5 disc remains normal, but the left facet is highly abnormal. There is abnormal facet joint fluid, the facet is inflamed, and there is intense surrounding  abnormal soft tissue inflammation and enhancement. There is a multiloculated fluid collection extending cephalad and caudal from the level of the facet joint in the left erector spinae muscle encompassing 17 x 22 x 62 mm (AP by transverse by CC), for an estimated volume of 12 mL. See series 10, image 14). And  there is mild left lateral epidural space thickening and inflammation adjacent to the left facet (series 7, image 34 and series 11, image 34, with abnormal enhancement continuing into the left L4 nerve level but no discrete epidural abscess at this time. L5-S1: Disc degeneration. The left L5 facets remain normal at this time. IMPRESSION: 1. Septic Arthritis left L4-L5 facet with associated Osteomyelitis, multiloculated left erector spinae Muscle Abscess (12 mL) and intense regional soft tissue edema/inflammation. Inflammation in the left L4 neural foramen. Early inflammation and enhancement in the left lateral epidural space there, and involving the dorsal PACU meninges at L4 and L5. No epidural abscess or spinal stenosis at this time. 2. No superimposed lumbar discitis or other site of infection at this time. Electronically Signed: By: Odessa Fleming M.D. On: 01/17/2022 12:25   ECHOCARDIOGRAM COMPLETE  Result Date: 01/18/2022    ECHOCARDIOGRAM REPORT   Patient Name:   Tanner [name] Date of Exam: 01/18/2022 Medical Rec #:  [id number]     Height:       72.0 in Accession #:    [id number]    Weight:       169.8 lb Date of Birth:  1966/07/31     BSA:          1.987 m Patient Age:    55 years      BP:           154/80 mmHg Patient Gender: M             HR:           58 bpm. Exam Location:  Inpatient Procedure: 2D Echo, 3D Echo, Cardiac Doppler and Color Doppler Indications:    Bacteremia  History:        Patient has no prior history of Echocardiogram examinations.                 Risk Factors:Hypertension, Current Smoker and Diabetes.                 Polysubstance abuse.  Sonographer:    Sheralyn Boatman RDCS Referring Phys: [phone] ANAND D HONGALGI IMPRESSIONS  1. Left ventricular ejection fraction by 3D volume is 55 %. The left ventricle has normal function. The left ventricle has no regional wall motion abnormalities. Left ventricular diastolic parameters were normal.  2. Right ventricular systolic function is normal. The right  ventricular size is normal.  3. The mitral valve is normal in structure. Trivial mitral valve regurgitation. No evidence of mitral stenosis.  4. There is a highly mobile filament on the tricupid valve that is not well visualized, given the clinical picture with bacteremia recommend TEE for clarification.  5. The aortic valve is normal in structure. Aortic valve regurgitation is not visualized. No aortic stenosis is present.  6. The inferior vena cava is normal in size with greater than 50% respiratory variability, suggesting right atrial pressure of 3 mmHg. Conclusion(s)/Recommendation(s): Findings concerning for possible vegetation, would recommend Transesophageal Echocardiogram for clarification. FINDINGS  Left Ventricle: Left ventricular ejection fraction by 3D volume is 55 %. The left ventricle has normal function. The left ventricle has no regional wall motion abnormalities. The left ventricular internal  cavity size was normal in size. There is no left  ventricular hypertrophy. Left ventricular diastolic parameters were normal. Right Ventricle: The right ventricular size is normal. No increase in right ventricular wall thickness. Right ventricular systolic function is normal. Left Atrium: Left atrial size was normal in size. Right Atrium: Right atrial size was normal in size. Pericardium: There is no evidence of pericardial effusion. Mitral Valve: The mitral valve is normal in structure. Mild to moderate mitral annular calcification. Trivial mitral valve regurgitation. No evidence of mitral valve stenosis. Tricuspid Valve: There is a highly mobile filament on the tricupid valve that is not well visualized, given the clinical picture with bacteremia recommend TEE for clarification. Tricuspid valve regurgitation is trivial. No evidence of tricuspid stenosis. Aortic Valve: The aortic valve is normal in structure. Aortic valve regurgitation is not visualized. No aortic stenosis is present. Pulmonic Valve: The  pulmonic valve was normal in structure. Pulmonic valve regurgitation is not visualized. No evidence of pulmonic stenosis. Aorta: The aortic root is normal in size and structure. Venous: The inferior vena cava is normal in size with greater than 50% respiratory variability, suggesting right atrial pressure of 3 mmHg. IAS/Shunts: No atrial level shunt detected by color flow Doppler.  LEFT VENTRICLE PLAX 2D LVIDd:         5.10 cm         Diastology LVIDs:         3.50 cm         LV e' medial:    6.74 cm/s LV PW:         1.10 cm         LV E/e' medial:  8.8 LV IVS:        1.00 cm         LV e' lateral:   9.08 cm/s LVOT diam:     2.30 cm         LV E/e' lateral: 6.5 LV SV:         89 LV SV Index:   45 LVOT Area:     4.15 cm        3D Volume EF                                LV 3D EF:    Left                                             ventricul LV Volumes (MOD)                            ar LV vol d, MOD    104.0 ml                   ejection A2C:                                        fraction LV vol d, MOD    132.0 ml                   by 3D A4C:  volume is LV vol s, MOD    51.1 ml                    55 %. A2C: LV vol s, MOD    58.4 ml A4C:                           3D Volume EF: LV SV MOD A2C:   52.9 ml       3D EF:        55 % LV SV MOD A4C:   132.0 ml      LV EDV:       241 ml LV SV MOD BP:    67.7 ml       LV ESV:       108 ml                                LV SV:        133 ml RIGHT VENTRICLE             IVC RV S prime:     10.00 cm/s  IVC diam: 1.50 cm TAPSE (M-mode): 1.8 cm LEFT ATRIUM           Index        RIGHT ATRIUM           Index LA diam:      3.70 cm 1.86 cm/m   RA Area:     13.85 cm LA Vol (A2C): 30.1 ml 15.15 ml/m  RA Volume:   37.90 ml  19.07 ml/m LA Vol (A4C): 35.5 ml 17.86 ml/m  AORTIC VALVE LVOT Vmax:   111.00 cm/s LVOT Vmean:  70.600 cm/s LVOT VTI:    0.215 m  AORTA Ao Root diam: 3.80 cm Ao Asc diam:  3.30 cm MITRAL VALVE MV Area (PHT): 3.26 cm     SHUNTS MV Decel Time: 233 msec    Systemic VTI:  0.22 m MV E velocity: 59.30 cm/s  Systemic Diam: 2.30 cm MV A velocity: 67.25 cm/s MV E/A ratio:  0.88 Kardie Tobb DO Electronically signed by Thomasene Ripple DO Signature Date/Time: 01/18/2022/2:44:19 PM    Final     Review of Systems  Unable to perform ROS: Acuity of condition  Blood pressure (!) 145/81, pulse 70, temperature 98.9 F (37.2 C), temperature source Oral, resp. rate 18, height 6' (1.829 m), weight 77 kg, SpO2 99 %. Physical Exam Constitutional:      Appearance: Normal appearance. He is normal weight.  HENT:     Head: Normocephalic and atraumatic.     Nose: Nose normal.     Mouth/Throat:     Mouth: Mucous membranes are moist.  Eyes:     Extraocular Movements: Extraocular movements intact.     Pupils: Pupils are equal, round, and reactive to light.  Neurological:     Mental Status: He is alert.     Comments: Alert and oriented cranial nerve examination is normal motor strength in the upper extremities is normal in deltoids biceps triceps grips and intrinsics.  In the lower extremities his motor strength is good in iliopsoas quad tibialis anterior and gastrocs though reflexes are absent in all these groups.  Psychiatric:        Mood and Affect: Mood normal.        Behavior: Behavior normal.  Thought Content: Thought content normal.        Judgment: Judgment normal.    Assessment/Plan: Extraspinal abscess in the rector muscles of the spine.  Involvement of the facet and no evidence of obvious discitis.  No surgical intervention is planned for this patient.  He can be [facility] at [facility] and mobilize with physical therapy as tolerated.  No bracing is necessary.  Shary Key Sheyla Zaffino 01/19/2022, 1:10 AM

## 2022-01-20 ENCOUNTER — Encounter (HOSPITAL_COMMUNITY): Payer: Self-pay | Admitting: Internal Medicine

## 2022-01-20 LAB — BASIC METABOLIC PANEL
Anion gap: 9 (ref 5–15)
BUN: 12 mg/dL (ref 6–20)
CO2: 22 mmol/L (ref 22–32)
Calcium: 7.9 mg/dL — ABNORMAL LOW (ref 8.9–10.3)
Chloride: 95 mmol/L — ABNORMAL LOW (ref 98–111)
Creatinine, Ser: 0.55 mg/dL — ABNORMAL LOW (ref 0.61–1.24)
GFR, Estimated: >60 mL/min (ref >60)
Glucose, Bld: 269 mg/dL — ABNORMAL HIGH (ref 70–99)
Potassium: 4 mmol/L (ref 3.5–5.1)
Sodium: 126 mmol/L — ABNORMAL LOW (ref 135–145)

## 2022-01-20 LAB — GLUCOSE, CAPILLARY
Glucose-Capillary: 238 mg/dL — ABNORMAL HIGH (ref 70–99)
Glucose-Capillary: 291 mg/dL — ABNORMAL HIGH (ref 70–99)
Glucose-Capillary: 207 mg/dL — ABNORMAL HIGH (ref 70–99)
Glucose-Capillary: 182 mg/dL — ABNORMAL HIGH (ref 70–99)

## 2022-01-20 LAB — CULTURE, BLOOD (ROUTINE X 2)
Special Requests: ADEQUATE
Special Requests: ADEQUATE

## 2022-01-20 MED ORDER — SENNOSIDES-DOCUSATE SODIUM 8.6-50 MG PO TABS
1.0000 | ORAL_TABLET | Freq: Two times a day (BID) | ORAL | Status: DC
  Administered 2022-01-20 – 2022-02-19 (×53): 1 via ORAL
  Filled 2022-01-20 (×59): qty 1

## 2022-01-20 MED ORDER — INSULIN GLARGINE-YFGN 100 UNIT/ML ~~LOC~~ SOLN
12.0000 [IU] | Freq: Every day | SUBCUTANEOUS | Status: DC
  Administered 2022-01-21 – 2022-02-02 (×13): 12 [IU] via SUBCUTANEOUS
  Filled 2022-01-20 (×14): qty 0.12

## 2022-01-20 MED ORDER — POLYETHYLENE GLYCOL 3350 17 G PO PACK
17.0000 g | PACK | Freq: Every day | ORAL | Status: AC
  Administered 2022-01-20 – 2022-01-21 (×2): 17 g via ORAL
  Filled 2022-01-20 (×3): qty 1

## 2022-01-20 MED ORDER — MORPHINE SULFATE (PF) 2 MG/ML IV SOLN
1.0000 mg | Freq: Once | INTRAVENOUS | Status: AC
  Administered 2022-01-20: 1 mg via INTRAVENOUS
  Filled 2022-01-20: qty 1

## 2022-01-20 NOTE — Progress Notes (Signed)
NP Garner Nash was notified that patient is complaining of back pain 10/10, PO Tramdol and Robaxin was given without relief.

## 2022-01-20 NOTE — Progress Notes (Signed)
Inpatient Diabetes Program Recommendations  AACE/ADA: New Consensus Statement on Inpatient Glycemic Control (2015)  Target Ranges:  Prepandial:   less than 140 mg/dL      Peak postprandial:   less than 180 mg/dL (1-2 hours)      Critically ill patients:  140 - 180 mg/dL   Lab Results  Component Value Date   GLUCAP 238 (H) 01/20/2022   HGBA1C 10.8 (H) 01/18/2022    Review of Glycemic Control  Diabetes history: DM2 Outpatient Diabetes medications: metformin 1000 mg BID (NOT taking) Current orders for Inpatient glycemic control: Semglee 10 units QD, Novolog 0-15 units TID with meals and 0-5 HS  HgbA1C - 10.8% FBS - 238 this am  Inpatient Diabetes Program Recommendations:    Consider increasing Semglee to 12 units QD  Continue to follow.  Thank you. Ailene Ards, RD, LDN, CDE Inpatient Diabetes Coordinator [phone]

## 2022-01-20 NOTE — Progress Notes (Signed)
[facility]   Reason for visit: Follow up on facet septic arthritis, TV endocarditis  Interval History: continues to have pain, WBC 13.6, remains afebrile.     Physical Exam: Constitutional:  Vitals:   01/19/22 2141 01/20/22 0554  BP: (!) 151/78 (!) 144/78  Pulse: 65 65  Resp: 16 16  Temp: 98.2 F (36.8 C) 98.5 F (36.9 C)  SpO2: 98% 97%   patient appears in NAD Eyes: anicteric HENT: no thrush Respiratory: Normal respiratory effort  Review of Systems: Constitutional: negative for fevers and chills  Lab Results  Component Value Date   WBC 13.6 (H) 01/19/2022   HGB 11.8 (L) 01/19/2022   HCT 33.6 (L) 01/19/2022   MCV 79.4 (L) 01/19/2022   PLT 325 01/19/2022    Lab Results  Component Value Date   CREATININE 0.55 (L) 01/20/2022   BUN 12 01/20/2022   NA 126 (L) 01/20/2022   K 4.0 01/20/2022   CL 95 (L) 01/20/2022   CO2 22 01/20/2022    Lab Results  Component Value Date   ALT 15 01/18/2022   AST 15 01/18/2022   ALKPHOS 108 01/18/2022     Microbiology: Recent Results (from the past 240 hour(s))  Blood culture (routine x 2)     Status: Abnormal   Collection Time: 01/17/22  1:40 PM   Specimen: BLOOD  Result Value Ref Range Status   Specimen Description   Final    BLOOD BLOOD LEFT FOREARM Performed at [facility], [address] [place], [place] [address]    Special Requests   Final    BOTTLES DRAWN AEROBIC AND ANAEROBIC Blood Culture adequate volume Performed at [facility], [address] [place], [place] [address]    Culture  Setup Time   Final    GRAM POSITIVE COCCI ANAEROBIC BOTTLE ONLY IN BOTH AEROBIC AND ANAEROBIC BOTTLES CRITICAL RESULT CALLED TO, READ BACK BY AND VERIFIED WITH: PHARMD MICHELLE LILLISTON 01/18/22@3 :11 BY TW Performed at [facility], [address] [place], [place] [address]    Culture STAPHYLOCOCCUS AUREUS (A)  Final   Report Status 01/20/2022 FINAL   Final   Organism ID, Bacteria STAPHYLOCOCCUS AUREUS  Final      Susceptibility   Staphylococcus aureus - MIC*    CIPROFLOXACIN <=0.5 SENSITIVE Sensitive     ERYTHROMYCIN <=0.25 SENSITIVE Sensitive     GENTAMICIN <=0.5 SENSITIVE Sensitive     OXACILLIN <=0.25 SENSITIVE Sensitive     TETRACYCLINE <=1 SENSITIVE Sensitive     VANCOMYCIN 1 SENSITIVE Sensitive     TRIMETH/SULFA <=10 SENSITIVE Sensitive     CLINDAMYCIN <=0.25 SENSITIVE Sensitive     RIFAMPIN <=0.5 SENSITIVE Sensitive     Inducible Clindamycin NEGATIVE Sensitive     * STAPHYLOCOCCUS AUREUS  Blood Culture ID Panel (Reflexed)     Status: Abnormal   Collection Time: 01/17/22  1:40 PM  Result Value Ref Range Status   Enterococcus faecalis NOT DETECTED NOT DETECTED Final   Enterococcus Faecium NOT DETECTED NOT DETECTED Final   Listeria monocytogenes NOT DETECTED NOT DETECTED Final   Staphylococcus species DETECTED (A) NOT DETECTED Final    Comment: CRITICAL RESULT CALLED TO, READ BACK BY AND VERIFIED WITH: PHARMD MICHELLE LILLISTON 01/18/22@3 :11 BY TW    Staphylococcus aureus (BCID) DETECTED (A) NOT DETECTED Final    Comment: CRITICAL RESULT CALLED TO, READ BACK BY AND VERIFIED WITH: PHARMD MICHELLE LILLISTON 01/18/22@3 :11 BY TW    Staphylococcus epidermidis  NOT DETECTED NOT DETECTED Final   Staphylococcus lugdunensis NOT DETECTED NOT DETECTED Final   Streptococcus species NOT DETECTED NOT DETECTED Final   Streptococcus agalactiae NOT DETECTED NOT DETECTED Final   Streptococcus pneumoniae NOT DETECTED NOT DETECTED Final   Streptococcus pyogenes NOT DETECTED NOT DETECTED Final   A.calcoaceticus-baumannii NOT DETECTED NOT DETECTED Final   Bacteroides fragilis NOT DETECTED NOT DETECTED Final   Enterobacterales NOT DETECTED NOT DETECTED Final   Enterobacter cloacae complex NOT DETECTED NOT DETECTED Final   Escherichia coli NOT DETECTED NOT DETECTED Final   Klebsiella aerogenes NOT DETECTED NOT DETECTED Final   Klebsiella oxytoca  NOT DETECTED NOT DETECTED Final   Klebsiella pneumoniae NOT DETECTED NOT DETECTED Final   Proteus species NOT DETECTED NOT DETECTED Final   Salmonella species NOT DETECTED NOT DETECTED Final   Serratia marcescens NOT DETECTED NOT DETECTED Final   Haemophilus influenzae NOT DETECTED NOT DETECTED Final   Neisseria meningitidis NOT DETECTED NOT DETECTED Final   Pseudomonas aeruginosa NOT DETECTED NOT DETECTED Final   Stenotrophomonas maltophilia NOT DETECTED NOT DETECTED Final   Candida albicans NOT DETECTED NOT DETECTED Final   Candida auris NOT DETECTED NOT DETECTED Final   Candida glabrata NOT DETECTED NOT DETECTED Final   Candida krusei NOT DETECTED NOT DETECTED Final   Candida parapsilosis NOT DETECTED NOT DETECTED Final   Candida tropicalis NOT DETECTED NOT DETECTED Final   Cryptococcus neoformans/gattii NOT DETECTED NOT DETECTED Final   Meth resistant mecA/C and MREJ NOT DETECTED NOT DETECTED Final    Comment: Performed at [facility], [address] [place], [place] [address]  Blood culture (routine x 2)     Status: Abnormal   Collection Time: 01/17/22  1:50 PM   Specimen: BLOOD  Result Value Ref Range Status   Specimen Description   Final    BLOOD BLOOD RIGHT HAND Performed at [facility], [address], [place], [place] [address]    Special Requests   Final    BOTTLES DRAWN AEROBIC ONLY Blood Culture adequate volume Performed at [facility], [address], [place], [place] [address]    Culture  Setup Time   Final    GRAM POSITIVE COCCI AEROBIC BOTTLE ONLY CRITICAL VALUE NOTED.  VALUE IS CONSISTENT WITH PREVIOUSLY REPORTED AND CALLED VALUE.    Culture (A)  Final    STAPHYLOCOCCUS AUREUS SUSCEPTIBILITIES PERFORMED ON PREVIOUS CULTURE WITHIN THE LAST 5 DAYS. Performed at [facility], [address], [place], [place] [address]    Report Status 01/20/2022 FINAL  Final  Blood culture (routine x 2)     Status: Abnormal    Collection Time: 01/17/22  2:05 PM   Specimen: BLOOD  Result Value Ref Range Status   Specimen Description   Final    BLOOD RIGHT ANTECUBITAL Performed at [facility], [address], [place], [place] [address]    Special Requests   Final    BOTTLES DRAWN AEROBIC AND ANAEROBIC Blood Culture results may not be optimal due to an excessive volume of blood received in culture bottles Performed at [facility], [address] [place], [place] [address]    Culture  Setup Time   Final    GRAM POSITIVE COCCI AEROBIC BOTTLE ONLY CRITICAL VALUE NOTED.  VALUE IS CONSISTENT WITH PREVIOUSLY REPORTED AND CALLED VALUE. IN BOTH AEROBIC AND ANAEROBIC BOTTLES    Culture (A)  Final    STAPHYLOCOCCUS AUREUS SUSCEPTIBILITIES PERFORMED ON PREVIOUS  CULTURE WITHIN THE LAST 5 DAYS. Performed at [facility], [address], [place], [place] [address]    Report Status 01/20/2022 FINAL  Final  Culture, blood (Routine X 2) w Reflex to ID Panel     Status: None (Preliminary result)   Collection Time: 01/19/22  9:40 AM   Specimen: BLOOD  Result Value Ref Range Status   Specimen Description   Final    BLOOD RIGHT ANTECUBITAL Performed at [facility], [address], [place], [place] [address]    Special Requests   Final    BOTTLES DRAWN AEROBIC AND ANAEROBIC Blood Culture adequate volume Performed at [facility], [address], [place], [place] [address]    Culture   Final    NO GROWTH < 24 HOURS Performed at [facility], [address], [place], [place] [address]    Report Status PENDING  Incomplete  Culture, blood (Routine X 2) w Reflex to ID Panel     Status: None (Preliminary result)   Collection Time: 01/19/22  9:46 AM   Specimen: BLOOD  Result Value Ref Range Status   Specimen Description   Final    BLOOD BLOOD LEFT FOREARM Performed at [facility], [address]  [place], [place] [address]    Special Requests   Final    BOTTLES DRAWN AEROBIC AND ANAEROBIC Blood Culture adequate volume Performed at [facility], [address] [place], [place] [address]    Culture   Final    NO GROWTH < 24 HOURS Performed at [facility], [address], [place], [place] [address]    Report Status PENDING  Incomplete    Impression/Plan:  1. Facet arthritis with abscess - cultures growth with MSSA in blood cultures and on cefazolin.  He is aware of the need for prolonged treatment inpatient with no home health or picc line options for home.  He will continue with cefazolin and I plan for about 2-3 weeks inpatient to assure clearance and if he is doing well, will consider dalbavancin at that time, +/- linezolid.    2.  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  No significant tricuspid valve dysfunction.  From an ID standpoint, TEE will not change his plan as he will be treated with prolonged antibiotics regardless and does not appear significant for the need for angiovac.   Will continue with cefazolin as above.   3.  Hepatitis C Ab positive - checking HCV RNA to see if active infection.    I will continue to follow intermittently

## 2022-01-20 NOTE — Progress Notes (Signed)
Transition of Care [facility]) Screening Note  Patient Details  Name: Tanner [name] Date of Birth: [birth year]  Transition of Care [facility] CM/SW Contact:    Sherie Don, LCSW Phone Number: 01/20/2022, 1:45 PM  Transition of Care Department [facility]) has reviewed patient and no TOC needs have been identified at this time. We will continue to monitor patient advancement through interdisciplinary progression rounds. If new patient transition needs arise, please place a TOC consult.

## 2022-01-20 NOTE — Progress Notes (Signed)
[facility]  PROGRESS NOTE  Navid Lenzen BSW:[id number] DOB: February 29, 1966 DOA: 01/17/2022 PCP: Patient, No Pcp Per (Inactive)   Brief HPI:    56 year old male, single, independent, currently unemployed, medical history significant for polysubstance abuse (tobacco, IVDA-crystal meth), type II DM, HTN, hepatitis C, cellulitis of right foot, opioid dependence, periodontal disease presented to the ED for the second time in a week with complaints of progressively worsening low back pain x2 weeks.  MRI L-spine confirms L4-5 facet septic arthritis with associated osteomyelitis and multiloculated left erector spinae muscle abscess (12 mL). BCID positive for MSSA.    Neurosurgery and ID consulted.  Currently on empiric IV cefazolin.    Subjective   Patient complains of pain not relieved by Percocet.  He received 1 dose of morphine 1 mg IV this morning.  Also has not moved his bowels since he was admitted.   Assessment/Plan:    Septic arthritis/osteomyelitis of L4-5 facet.  Multiloculated left erector spinae muscle abscess -Secondary to IV drug use with MSSA bacteremia -TTE shows possible vegetation on tricuspid valve -Currently on IV cefazolin -Neurosurgery was consulted, no plan for surgical intervention -Continue tramadol as needed, will give additional dose of morphine 1 mg IV.  MSSA bacteremia -TTE confirms possibility of tricuspid valve vegetation -ID recommends to continue with IV cefazolin, no indication for TEE as patient is going to be on prolonged antibiotics course due to septic arthritis  Opioid-induced constipation -Start MiraLAX 17 g daily -Start 1 tablet Senokot-S tablet twice daily  Diabetes mellitus type 2 -CBG has been elevated -Change Lantus to 12 units subcu daily -Sliding scale insulin with NovoLog -Hemoglobin A1c 10.8  Hyponatremia -Sodium is  still hovering around 1 26-1 30 -Today sodium is 126 -Likely SIADH -Continue fluid restriction 1.5  L/day  Microcytic anemia -Hemoglobin stable at 11.8 -We will need further evaluation with anemia panel once infection subsides  Hypomagnesemia -Replete  Moderate protein calorie malnutrition -Dietitian consulted  Polysubstance abuse -Patient did IV crystal meth 4 to 5 days prior to admission -Cessation counseling provided -Continue nicotine patch   Medications     insulin aspart  0-15 Units Subcutaneous TID WC   insulin aspart  0-5 Units Subcutaneous QHS   [START ON 01/21/2022] insulin glargine-yfgn  12 Units Subcutaneous Daily   multivitamin with minerals  1 tablet Oral Daily   nicotine  21 mg Transdermal Daily   pneumococcal 20-valent conjugate vaccine  0.5 mL Intramuscular Tomorrow-1000   polyethylene glycol  17 g Oral Daily   Ensure Max Protein  11 oz Oral BID   senna-docusate  1 tablet Oral BID   traZODone  50 mg Oral QHS     Data Reviewed:   CBG:  Recent Labs  Lab 01/19/22 1417 01/19/22 1654 01/19/22 2144 01/20/22 0720 01/20/22 1128  GLUCAP 125* 152* 254* 238* 291*    SpO2: 98 %    Vitals:   01/19/22 1336 01/19/22 2141 01/20/22 0554 01/20/22 1336  BP: 136/84 (!) 151/78 (!) 144/78 (!) 143/81  Pulse: 62 65 65 64  Resp: 18 16 16 14   Temp: 98.4 F (36.9 C) 98.2 F (36.8 C) 98.5 F (36.9 C) 98.3 F (36.8 C)  TempSrc: Oral Oral Oral Oral  SpO2: 99% 98% 97% 98%  Weight:      Height:          Data Reviewed:  Basic Metabolic Panel: Recent Labs  Lab 01/17/22 0722 01/18/22 0331 01/19/22 0939 01/20/22 0807  NA 124* 125* 129* 126*  K  3.7 4.5 3.8 4.0  CL 88* 93* 98 95*  CO2 25 24 24 22   GLUCOSE 460* 324* 202* 269*  BUN 20 20 13 12   CREATININE 0.84 0.84 0.69 0.55*  CALCIUM 8.5* 7.8* 7.9* 7.9*  MG 1.6* 1.9  --   --   PHOS 3.0  --   --   --     CBC: Recent Labs  Lab 01/17/22 0722 01/18/22 0331 01/19/22 0939  WBC 11.3* 14.1* 13.6*  NEUTROABS 9.5*  --   --   HGB 11.1* 11.4* 11.8*  HCT 31.4* 32.7* 33.6*  MCV 78.9* 79.8* 79.4*  PLT  258 304 325    LFT Recent Labs  Lab 01/17/22 0722 01/18/22 0331  AST 16 15  ALT 16 15  ALKPHOS 109 108  BILITOT 0.8 0.8  PROT 6.8 6.1*  ALBUMIN 2.7* 2.4*     Antibiotics: Anti-infectives (From admission, onward)    Start     Dose/Rate Route Frequency Ordered Stop   01/18/22 0600  ceFAZolin (ANCEF) IVPB 2g/100 mL premix        2 g 200 mL/hr over 30 Minutes Intravenous Every 8 hours 01/18/22 0330     01/18/22 0200  vancomycin (VANCOREADY) IVPB 1250 mg/250 mL  Status:  Discontinued        1,250 mg 166.7 mL/hr over 90 Minutes Intravenous Every 12 hours 01/17/22 1741 01/18/22 0330   01/17/22 2200  ceFEPIme (MAXIPIME) 2 g in sodium chloride 0.9 % 100 mL IVPB  Status:  Discontinued        2 g 200 mL/hr over 30 Minutes Intravenous Every 8 hours 01/17/22 1645 01/18/22 0330   01/17/22 1400  vancomycin (VANCOREADY) IVPB 1500 mg/300 mL        1,500 mg 150 mL/hr over 120 Minutes Intravenous  Once 01/17/22 1323 01/17/22 1629   01/17/22 1330  ceFEPIme (MAXIPIME) 2 g in sodium chloride 0.9 % 100 mL IVPB        2 g 200 mL/hr over 30 Minutes Intravenous  Once 01/17/22 1323 01/17/22 1629        DVT prophylaxis: SCDs  Code Status: Full code  Family Communication: No family at bedside   CONSULTS neurosurgery, infectious disease   Objective    Physical Examination:  General-appears in no acute distress Heart-S1-S2, regular, no murmur auscultated Lungs-clear to auscultation bilaterally, no wheezing or crackles auscultated Abdomen-soft, mild tenderness to palpation in left lower quadrant, no organomegaly Extremities-no edema in the lower extremities Neuro-alert, oriented x3, no focal deficit noted   Status is: Inpatient: On IV antibiotics for lumbar septic arthritis/abscess        03/19/22   [facility] If 7PM-7AM, please contact night-coverage at [email or web address], Office  [phone]   01/20/2022, 3:47 PM  LOS: 3 days

## 2022-01-21 LAB — COMPREHENSIVE METABOLIC PANEL
ALT: 12 U/L (ref 0–44)
AST: 14 U/L — ABNORMAL LOW (ref 15–41)
Albumin: 2.4 g/dL — ABNORMAL LOW (ref 3.5–5.0)
Alkaline Phosphatase: 105 U/L (ref 38–126)
Anion gap: 10 (ref 5–15)
BUN: 12 mg/dL (ref 6–20)
CO2: 26 mmol/L (ref 22–32)
Calcium: 8.4 mg/dL — ABNORMAL LOW (ref 8.9–10.3)
Chloride: 95 mmol/L — ABNORMAL LOW (ref 98–111)
Creatinine, Ser: 0.54 mg/dL — ABNORMAL LOW (ref 0.61–1.24)
GFR, Estimated: >60 mL/min (ref >60)
Glucose, Bld: 148 mg/dL — ABNORMAL HIGH (ref 70–99)
Potassium: 3.9 mmol/L (ref 3.5–5.1)
Sodium: 131 mmol/L — ABNORMAL LOW (ref 135–145)
Total Bilirubin: 0.3 mg/dL (ref 0.3–1.2)
Total Protein: 6.5 g/dL (ref 6.5–8.1)

## 2022-01-21 LAB — HCV RNA QUANT RFLX ULTRA OR GENOTYP
HCV RNA Qnt(log copy/mL): UNDETERMINED log10 IU/mL
HepC Qn: NOT DETECTED IU/mL

## 2022-01-21 LAB — GLUCOSE, CAPILLARY
Glucose-Capillary: 176 mg/dL — ABNORMAL HIGH (ref 70–99)
Glucose-Capillary: 255 mg/dL — ABNORMAL HIGH (ref 70–99)
Glucose-Capillary: 159 mg/dL — ABNORMAL HIGH (ref 70–99)
Glucose-Capillary: 204 mg/dL — ABNORMAL HIGH (ref 70–99)

## 2022-01-21 MED ORDER — BISACODYL 10 MG RE SUPP
10.0000 mg | Freq: Once | RECTAL | Status: AC
Start: 2022-01-21 — End: 2022-01-21
  Administered 2022-01-21: 10 mg via RECTAL
  Filled 2022-01-21: qty 1

## 2022-01-21 NOTE — Progress Notes (Signed)
[facility]  PROGRESS NOTE  Tanner [name] GYI:[id number] DOB: [birth year] DOA: 01/17/2022 PCP: Patient, No Pcp Per (Inactive)   Brief HPI:    56 year old male, single, independent, currently unemployed, medical history significant for polysubstance abuse (tobacco, IVDA-crystal meth), type II DM, HTN, hepatitis C, cellulitis of right foot, opioid dependence, periodontal disease presented to the ED for the second time in a week with complaints of progressively worsening low back pain x2 weeks.  MRI L-spine confirms L4-5 facet septic arthritis with associated osteomyelitis and multiloculated left erector spinae muscle abscess (12 mL). BCID positive for MSSA.    Neurosurgery and ID consulted.  Currently on empiric IV cefazolin.    Subjective   Patient seen and examined, pain has improved.  Still no BM.   Assessment/Plan:    Septic arthritis/osteomyelitis of L4-5 facet.  Multiloculated left erector spinae muscle abscess -Secondary to IV drug use with MSSA bacteremia -TTE shows possible vegetation on tricuspid valve -Currently on IV cefazolin -Neurosurgery was consulted, no plan for surgical intervention -Continue tramadol as needed, will give additional dose of morphine 1 mg IV.  MSSA bacteremia -TTE confirms possibility of tricuspid valve vegetation -ID recommends to continue with IV cefazolin, no indication for TEE as patient is going to be on prolonged antibiotics course due to septic arthritis  Opioid-induced constipation -Started on  MiraLAX 17 g daily -Also started on 1 tablet Senokot-S tablet twice daily  Diabetes mellitus type 2 -CBG has been elevated -Change Lantus to 12 units subcu daily -Sliding scale insulin with NovoLog -Hemoglobin A1c 10.8  Hyponatremia -Sodium is  still hovering around 1 26-1 30 -Today sodium is 126 -Likely SIADH -Continue fluid restriction 1.5 L/day  Microcytic anemia -Hemoglobin stable at 11.8 -We will need further evaluation with  anemia panel once infection subsides  Hypomagnesemia -Replete  Moderate protein calorie malnutrition -Dietitian consulted  Polysubstance abuse -Patient did IV crystal meth 4 to 5 days prior to admission -Cessation counseling provided -Continue nicotine patch   Medications     insulin aspart  0-15 Units Subcutaneous TID WC   insulin aspart  0-5 Units Subcutaneous QHS   insulin glargine-yfgn  12 Units Subcutaneous Daily   multivitamin with minerals  1 tablet Oral Daily   nicotine  21 mg Transdermal Daily   pneumococcal 20-valent conjugate vaccine  0.5 mL Intramuscular Tomorrow-1000   polyethylene glycol  17 g Oral Daily   Ensure Max Protein  11 oz Oral BID   senna-docusate  1 tablet Oral BID   traZODone  50 mg Oral QHS     Data Reviewed:   CBG:  Recent Labs  Lab 01/20/22 1128 01/20/22 1619 01/20/22 2100 01/21/22 0721 01/21/22 1126  GLUCAP 291* 207* 182* 176* 255*    SpO2: 99 %    Vitals:   01/20/22 1336 01/20/22 2055 01/21/22 0500 01/21/22 1315  BP: (!) 143/81 (!) 152/76 (!) 141/83 123/71  Pulse: 64 60 (!) 58 61  Resp: 14 16 16 18   Temp: 98.3 F (36.8 C) 98.5 F (36.9 C) 98 F (36.7 C) 97.7 F (36.5 C)  TempSrc: Oral Oral Oral Oral  SpO2: 98% 99% 98% 99%  Weight:      Height:          Data Reviewed:  Basic Metabolic Panel: Recent Labs  Lab 01/17/22 0722 01/18/22 0331 01/19/22 0939 01/20/22 0807 01/21/22 0439  NA 124* 125* 129* 126* 131*  K 3.7 4.5 3.8 4.0 3.9  CL 88* 93* 98 95* 95*  CO2 25  24 24 22 26   GLUCOSE 460* 324* 202* 269* 148*  BUN 20 20 13 12 12   CREATININE 0.84 0.84 0.69 0.55* 0.54*  CALCIUM 8.5* 7.8* 7.9* 7.9* 8.4*  MG 1.6* 1.9  --   --   --   PHOS 3.0  --   --   --   --     CBC: Recent Labs  Lab 01/17/22 0722 01/18/22 0331 01/19/22 0939  WBC 11.3* 14.1* 13.6*  NEUTROABS 9.5*  --   --   HGB 11.1* 11.4* 11.8*  HCT 31.4* 32.7* 33.6*  MCV 78.9* 79.8* 79.4*  PLT 258 304 325    LFT Recent Labs  Lab  01/17/22 0722 01/18/22 0331 01/21/22 0439  AST 16 15 14*  ALT 16 15 12   ALKPHOS 109 108 105  BILITOT 0.8 0.8 0.3  PROT 6.8 6.1* 6.5  ALBUMIN 2.7* 2.4* 2.4*     Antibiotics: Anti-infectives (From admission, onward)    Start     Dose/Rate Route Frequency Ordered Stop   01/18/22 0600  ceFAZolin (ANCEF) IVPB 2g/100 mL premix        2 g 200 mL/hr over 30 Minutes Intravenous Every 8 hours 01/18/22 0330     01/18/22 0200  vancomycin (VANCOREADY) IVPB 1250 mg/250 mL  Status:  Discontinued        1,250 mg 166.7 mL/hr over 90 Minutes Intravenous Every 12 hours 01/17/22 1741 01/18/22 0330   01/17/22 2200  ceFEPIme (MAXIPIME) 2 g in sodium chloride 0.9 % 100 mL IVPB  Status:  Discontinued        2 g 200 mL/hr over 30 Minutes Intravenous Every 8 hours 01/17/22 1645 01/18/22 0330   01/17/22 1400  vancomycin (VANCOREADY) IVPB 1500 mg/300 mL        1,500 mg 150 mL/hr over 120 Minutes Intravenous  Once 01/17/22 1323 01/17/22 1629   01/17/22 1330  ceFEPIme (MAXIPIME) 2 g in sodium chloride 0.9 % 100 mL IVPB        2 g 200 mL/hr over 30 Minutes Intravenous  Once 01/17/22 1323 01/17/22 1629        DVT prophylaxis: SCDs  Code Status: Full code  Family Communication: No family at bedside   CONSULTS neurosurgery, infectious disease   Objective    Physical Examination:  General-appears in no acute distress Heart-S1-S2, regular, no murmur auscultated Lungs-clear to auscultation bilaterally, no wheezing or crackles auscultated Abdomen-soft, nontender, no organomegaly Extremities-no edema in the lower extremities Neuro-alert, oriented x3, no focal deficit noted  Status is: Inpatient: On IV antibiotics for lumbar septic arthritis/abscess        03/19/22   [facility] If 7PM-7AM, please contact night-coverage at [email or web address], Office  [phone]   01/21/2022, 2:25 PM  LOS: 4 days

## 2022-01-22 LAB — GLUCOSE, CAPILLARY
Glucose-Capillary: 156 mg/dL — ABNORMAL HIGH (ref 70–99)
Glucose-Capillary: 290 mg/dL — ABNORMAL HIGH (ref 70–99)
Glucose-Capillary: 237 mg/dL — ABNORMAL HIGH (ref 70–99)

## 2022-01-22 MED ORDER — KETOROLAC TROMETHAMINE 30 MG/ML IJ SOLN
30.0000 mg | Freq: Once | INTRAMUSCULAR | Status: AC
  Administered 2022-01-22: 30 mg via INTRAVENOUS
  Filled 2022-01-22: qty 1

## 2022-01-22 NOTE — Progress Notes (Signed)
[facility] for [facility]   Reason for visit: Follow up on facet arthritis with abscess  Interval History: no acute eventts; remains afebrile.     Physical Exam: Constitutional:  Vitals:   01/21/22 2059 01/22/22 0606  BP: (!) 157/84 136/76  Pulse: 70 61  Resp: 18 18  Temp: 98.7 F (37.1 C) 98.6 F (37 C)  SpO2: 99% 98%   patient appears in NAD Respiratory: Normal respiratory effort  Review of Systems: Constitutional: negative for fevers and chills  Lab Results  Component Value Date   WBC 13.6 (H) 01/19/2022   HGB 11.8 (L) 01/19/2022   HCT 33.6 (L) 01/19/2022   MCV 79.4 (L) 01/19/2022   PLT 325 01/19/2022    Lab Results  Component Value Date   CREATININE 0.54 (L) 01/21/2022   BUN 12 01/21/2022   NA 131 (L) 01/21/2022   K 3.9 01/21/2022   CL 95 (L) 01/21/2022   CO2 26 01/21/2022    Lab Results  Component Value Date   ALT 12 01/21/2022   AST 14 (L) 01/21/2022   ALKPHOS 105 01/21/2022     Microbiology: Recent Results (from the past 240 hour(s))  Blood culture (routine x 2)     Status: Abnormal   Collection Time: 01/17/22  1:40 PM   Specimen: BLOOD  Result Value Ref Range Status   Specimen Description   Final    BLOOD BLOOD LEFT FOREARM Performed at [facility], [place] [address], [place], [place] [address]    Special Requests   Final    BOTTLES DRAWN AEROBIC AND ANAEROBIC Blood Culture adequate volume Performed at [facility] [address] [place], [place] [address]    Culture  Setup Time   Final    GRAM POSITIVE COCCI ANAEROBIC BOTTLE ONLY IN BOTH AEROBIC AND ANAEROBIC BOTTLES CRITICAL RESULT CALLED TO, READ BACK BY AND VERIFIED WITH: PHARMD MICHELLE LILLISTON 01/18/22@3 :11 BY TW Performed at [facility], [place] [address] [place], [place] [address]    Culture STAPHYLOCOCCUS AUREUS (A)  Final   Report Status 01/20/2022 FINAL  Final   Organism ID, Bacteria STAPHYLOCOCCUS AUREUS  Final       Susceptibility   Staphylococcus aureus - MIC*    CIPROFLOXACIN <=0.5 SENSITIVE Sensitive     ERYTHROMYCIN <=0.25 SENSITIVE Sensitive     GENTAMICIN <=0.5 SENSITIVE Sensitive     OXACILLIN <=0.25 SENSITIVE Sensitive     TETRACYCLINE <=1 SENSITIVE Sensitive     VANCOMYCIN 1 SENSITIVE Sensitive     TRIMETH/SULFA <=10 SENSITIVE Sensitive     CLINDAMYCIN <=0.25 SENSITIVE Sensitive     RIFAMPIN <=0.5 SENSITIVE Sensitive     Inducible Clindamycin NEGATIVE Sensitive     * STAPHYLOCOCCUS AUREUS  Blood Culture ID Panel (Reflexed)     Status: Abnormal   Collection Time: 01/17/22  1:40 PM  Result Value Ref Range Status   Enterococcus faecalis NOT DETECTED NOT DETECTED Final   Enterococcus Faecium NOT DETECTED NOT DETECTED Final   Listeria monocytogenes NOT DETECTED NOT DETECTED Final   Staphylococcus species DETECTED (A) NOT DETECTED Final    Comment: CRITICAL RESULT CALLED TO, READ BACK BY AND VERIFIED WITH: PHARMD MICHELLE LILLISTON 01/18/22@3 :11 BY TW    Staphylococcus aureus (BCID) DETECTED (A) NOT DETECTED Final    Comment: CRITICAL RESULT CALLED TO, READ BACK BY AND VERIFIED WITH: PHARMD MICHELLE LILLISTON 01/18/22@3 :11 BY TW    Staphylococcus epidermidis NOT DETECTED NOT DETECTED Final   Staphylococcus lugdunensis  NOT DETECTED NOT DETECTED Final   Streptococcus species NOT DETECTED NOT DETECTED Final   Streptococcus agalactiae NOT DETECTED NOT DETECTED Final   Streptococcus pneumoniae NOT DETECTED NOT DETECTED Final   Streptococcus pyogenes NOT DETECTED NOT DETECTED Final   A.calcoaceticus-baumannii NOT DETECTED NOT DETECTED Final   Bacteroides fragilis NOT DETECTED NOT DETECTED Final   Enterobacterales NOT DETECTED NOT DETECTED Final   Enterobacter cloacae complex NOT DETECTED NOT DETECTED Final   Escherichia coli NOT DETECTED NOT DETECTED Final   Klebsiella aerogenes NOT DETECTED NOT DETECTED Final   Klebsiella oxytoca NOT DETECTED NOT DETECTED Final   Klebsiella pneumoniae NOT  DETECTED NOT DETECTED Final   Proteus species NOT DETECTED NOT DETECTED Final   Salmonella species NOT DETECTED NOT DETECTED Final   Serratia marcescens NOT DETECTED NOT DETECTED Final   Haemophilus influenzae NOT DETECTED NOT DETECTED Final   Neisseria meningitidis NOT DETECTED NOT DETECTED Final   Pseudomonas aeruginosa NOT DETECTED NOT DETECTED Final   Stenotrophomonas maltophilia NOT DETECTED NOT DETECTED Final   Candida albicans NOT DETECTED NOT DETECTED Final   Candida auris NOT DETECTED NOT DETECTED Final   Candida glabrata NOT DETECTED NOT DETECTED Final   Candida krusei NOT DETECTED NOT DETECTED Final   Candida parapsilosis NOT DETECTED NOT DETECTED Final   Candida tropicalis NOT DETECTED NOT DETECTED Final   Cryptococcus neoformans/gattii NOT DETECTED NOT DETECTED Final   Meth resistant mecA/C and MREJ NOT DETECTED NOT DETECTED Final    Comment: Performed at [facility], [address], [place], [place] [address]  Blood culture (routine x 2)     Status: Abnormal   Collection Time: 01/17/22  1:50 PM   Specimen: BLOOD  Result Value Ref Range Status   Specimen Description   Final    BLOOD BLOOD RIGHT HAND Performed at [facility] [address], [place], [place] [address]    Special Requests   Final    BOTTLES DRAWN AEROBIC ONLY Blood Culture adequate volume Performed at [facility] [address], [place], [place] [address]    Culture  Setup Time   Final    GRAM POSITIVE COCCI AEROBIC BOTTLE ONLY CRITICAL VALUE NOTED.  VALUE IS CONSISTENT WITH PREVIOUSLY REPORTED AND CALLED VALUE.    Culture (A)  Final    STAPHYLOCOCCUS AUREUS SUSCEPTIBILITIES PERFORMED ON PREVIOUS CULTURE WITHIN THE LAST 5 DAYS. Performed at [facility], [place] [address], [place], [place] [address]    Report Status 01/20/2022 FINAL  Final  Blood culture (routine x 2)     Status: Abnormal   Collection Time: 01/17/22  2:05 PM   Specimen: BLOOD   Result Value Ref Range Status   Specimen Description   Final    BLOOD RIGHT ANTECUBITAL Performed at [facility] [address] [place], [place] [address]    Special Requests   Final    BOTTLES DRAWN AEROBIC AND ANAEROBIC Blood Culture results may not be optimal due to an excessive volume of blood received in culture bottles Performed at [facility] [address], [place], [place] [address]    Culture  Setup Time   Final    GRAM POSITIVE COCCI AEROBIC BOTTLE ONLY CRITICAL VALUE NOTED.  VALUE IS CONSISTENT WITH PREVIOUSLY REPORTED AND CALLED VALUE. IN BOTH AEROBIC AND ANAEROBIC BOTTLES    Culture (A)  Final    STAPHYLOCOCCUS AUREUS SUSCEPTIBILITIES PERFORMED ON PREVIOUS CULTURE WITHIN THE LAST 5 DAYS. Performed at [facility], [place] [address] [place], [place] [address]    Report Status 01/20/2022 FINAL  Final  Culture, blood (Routine X 2) w Reflex to ID Panel     Status: None (Preliminary result)   Collection Time: 01/19/22  9:40 AM   Specimen: BLOOD  Result Value Ref Range Status   Specimen Description   Final    BLOOD RIGHT ANTECUBITAL Performed at [facility] [address], [place], [place] [address]    Special Requests   Final    BOTTLES DRAWN AEROBIC AND ANAEROBIC Blood Culture adequate volume Performed at [facility] [address] [place], [place] [address]    Culture   Final    NO GROWTH 2 DAYS Performed at [facility] [address], [place], [place] [address]    Report Status PENDING  Incomplete  Culture, blood (Routine X 2) w Reflex to ID Panel     Status: None (Preliminary result)   Collection Time: 01/19/22  9:46 AM   Specimen: BLOOD  Result Value Ref Range Status   Specimen Description   Final    BLOOD BLOOD LEFT FOREARM Performed at [facility] [address] [place], [place] [address]    Special Requests   Final    BOTTLES DRAWN  AEROBIC AND ANAEROBIC Blood Culture adequate volume Performed at [facility] [address], [place],  [address]    Culture   Final    NO GROWTH 2 DAYS Performed at [facility] [address] [place],  [address]    Report Status PENDING  Incomplete    Impression/Plan:  1. Facet arthritis with abscess - MSSA on blood cultures and repeat blood culture ngtd.  No acute changes and will continue with cefazolin  2.  TV endocarditis - vegetation noted and on treatment as in #1.  Continue cefazolin.   3.  Hepatitis C Ab positive - RNA test negative so no treatment indicated.    Dr. Baxter Flattery is on over the weekend if needed, otherwise we will follow up next week.

## 2022-01-22 NOTE — Progress Notes (Addendum)
[facility]  PROGRESS NOTE  Tanner [name] QJJ:[id number] DOB: 08/10/1966 DOA: 01/17/2022 PCP: Patient, No Pcp Per (Inactive)   Brief HPI:    56 year old male, single, independent, currently unemployed, medical history significant for polysubstance abuse (tobacco, IVDA-crystal meth), type II DM, HTN, hepatitis C, cellulitis of right foot, opioid dependence, periodontal disease presented to the ED for the second time in a week with complaints of progressively worsening low back pain x2 weeks.  MRI L-spine confirms L4-5 facet septic arthritis with associated osteomyelitis and multiloculated left erector spinae muscle abscess (12 mL). BCID positive for MSSA.    Neurosurgery and ID consulted.  Currently on empiric IV cefazolin.    Subjective   Patient seen and examined, pain is improved.  Had BM this morning.   Assessment/Plan:    Septic arthritis/osteomyelitis of L4-5 facet.  Multiloculated left erector spinae muscle abscess -Secondary to IV drug use with MSSA bacteremia -TTE shows possible vegetation on tricuspid valve -Currently on IV cefazolin -Neurosurgery was consulted, no plan for surgical intervention -Continue tramadol as needed, will give additional dose of morphine 1 mg IV.  MSSA bacteremia -TTE confirms possibility of tricuspid valve vegetation -ID recommends to continue with IV cefazolin, no indication for TEE as patient is going to be on prolonged antibiotics course due to septic arthritis  Opioid-induced constipation -Had BM this morning -Started on  MiraLAX 17 g daily -Also started on 1 tablet Senokot-S tablet twice daily  Diabetes mellitus type 2 -CBG has been elevated -Change Lantus to 12 units subcu daily -Sliding scale insulin with NovoLog -Hemoglobin A1c 10.8  Hyponatremia -Sodium is  still hovering around 1 26-1 30 -Today sodium is 131 -Likely SIADH -Continue fluid restriction 1.5 L/day  Microcytic anemia -Hemoglobin stable at 11.8 -We will need  further evaluation with anemia panel once infection subsides  Hypomagnesemia -Replete  Moderate protein calorie malnutrition -Dietitian consulted  Polysubstance abuse -Patient did IV crystal meth 4 to 5 days prior to admission -Cessation counseling provided -Continue nicotine patch   Medications     insulin aspart  0-15 Units Subcutaneous TID WC   insulin aspart  0-5 Units Subcutaneous QHS   insulin glargine-yfgn  12 Units Subcutaneous Daily   multivitamin with minerals  1 tablet Oral Daily   nicotine  21 mg Transdermal Daily   pneumococcal 20-valent conjugate vaccine  0.5 mL Intramuscular Tomorrow-1000   Ensure Max Protein  11 oz Oral BID   senna-docusate  1 tablet Oral BID   traZODone  50 mg Oral QHS     Data Reviewed:   CBG:  Recent Labs  Lab 01/21/22 0721 01/21/22 1126 01/21/22 1627 01/21/22 2126 01/22/22 0722  GLUCAP 176* 255* 159* 204* 156*    SpO2: 100 %    Vitals:   01/21/22 1315 01/21/22 2059 01/22/22 0606 01/22/22 1417  BP: 123/71 (!) 157/84 136/76 126/79  Pulse: 61 70 61 (!) 57  Resp: 18 18 18 14   Temp: 97.7 F (36.5 C) 98.7 F (37.1 C) 98.6 F (37 C) (!) 97.5 F (36.4 C)  TempSrc: Oral Oral Oral Oral  SpO2: 99% 99% 98% 100%  Weight:      Height:          Data Reviewed:  Basic Metabolic Panel: Recent Labs  Lab 01/17/22 0722 01/18/22 0331 01/19/22 0939 01/20/22 0807 01/21/22 0439  NA 124* 125* 129* 126* 131*  K 3.7 4.5 3.8 4.0 3.9  CL 88* 93* 98 95* 95*  CO2 25 24 24 22  26  GLUCOSE 460* 324* 202* 269* 148*  BUN 20 20 13 12 12   CREATININE 0.84 0.84 0.69 0.55* 0.54*  CALCIUM 8.5* 7.8* 7.9* 7.9* 8.4*  MG 1.6* 1.9  --   --   --   PHOS 3.0  --   --   --   --     CBC: Recent Labs  Lab 01/17/22 0722 01/18/22 0331 01/19/22 0939  WBC 11.3* 14.1* 13.6*  NEUTROABS 9.5*  --   --   HGB 11.1* 11.4* 11.8*  HCT 31.4* 32.7* 33.6*  MCV 78.9* 79.8* 79.4*  PLT 258 304 325    LFT Recent Labs  Lab 01/17/22 0722 01/18/22 0331  01/21/22 0439  AST 16 15 14*  ALT 16 15 12   ALKPHOS 109 108 105  BILITOT 0.8 0.8 0.3  PROT 6.8 6.1* 6.5  ALBUMIN 2.7* 2.4* 2.4*     Antibiotics: Anti-infectives (From admission, onward)    Start     Dose/Rate Route Frequency Ordered Stop   01/18/22 0600  ceFAZolin (ANCEF) IVPB 2g/100 mL premix        2 g 200 mL/hr over 30 Minutes Intravenous Every 8 hours 01/18/22 0330     01/18/22 0200  vancomycin (VANCOREADY) IVPB 1250 mg/250 mL  Status:  Discontinued        1,250 mg 166.7 mL/hr over 90 Minutes Intravenous Every 12 hours 01/17/22 1741 01/18/22 0330   01/17/22 2200  ceFEPIme (MAXIPIME) 2 g in sodium chloride 0.9 % 100 mL IVPB  Status:  Discontinued        2 g 200 mL/hr over 30 Minutes Intravenous Every 8 hours 01/17/22 1645 01/18/22 0330   01/17/22 1400  vancomycin (VANCOREADY) IVPB 1500 mg/300 mL        1,500 mg 150 mL/hr over 120 Minutes Intravenous  Once 01/17/22 1323 01/17/22 1629   01/17/22 1330  ceFEPIme (MAXIPIME) 2 g in sodium chloride 0.9 % 100 mL IVPB        2 g 200 mL/hr over 30 Minutes Intravenous  Once 01/17/22 1323 01/17/22 1629        DVT prophylaxis: SCDs  Code Status: Full code  Family Communication: No family at bedside   CONSULTS neurosurgery, infectious disease   Objective    Physical Examination:  General-appears in no acute distress Heart-S1-S2, regular, no murmur auscultated Lungs-clear to auscultation bilaterally, no wheezing or crackles auscultated Abdomen-soft, nontender, no organomegaly Extremities-no edema in the lower extremities Neuro-alert, oriented x3, no focal deficit noted  Status is: Inpatient: On IV antibiotics for lumbar septic arthritis/abscess        03/19/22   [facility] If 7PM-7AM, please contact night-coverage at [email or web address], Office  [phone]   01/22/2022, 2:49 PM  LOS: 5 days

## 2022-01-23 LAB — BASIC METABOLIC PANEL
Anion gap: 8 (ref 5–15)
BUN: 15 mg/dL (ref 6–20)
CO2: 27 mmol/L (ref 22–32)
Calcium: 8.4 mg/dL — ABNORMAL LOW (ref 8.9–10.3)
Chloride: 97 mmol/L — ABNORMAL LOW (ref 98–111)
Creatinine, Ser: 0.72 mg/dL (ref 0.61–1.24)
GFR, Estimated: >60 mL/min (ref >60)
Glucose, Bld: 238 mg/dL — ABNORMAL HIGH (ref 70–99)
Potassium: 4.3 mmol/L (ref 3.5–5.1)
Sodium: 132 mmol/L — ABNORMAL LOW (ref 135–145)

## 2022-01-23 LAB — GLUCOSE, CAPILLARY
Glucose-Capillary: 152 mg/dL — ABNORMAL HIGH (ref 70–99)
Glucose-Capillary: 220 mg/dL — ABNORMAL HIGH (ref 70–99)
Glucose-Capillary: 189 mg/dL — ABNORMAL HIGH (ref 70–99)
Glucose-Capillary: 273 mg/dL — ABNORMAL HIGH (ref 70–99)

## 2022-01-23 MED ORDER — KETOROLAC TROMETHAMINE 30 MG/ML IJ SOLN
30.0000 mg | Freq: Once | INTRAMUSCULAR | Status: AC
  Administered 2022-01-23: 30 mg via INTRAVENOUS
  Filled 2022-01-23: qty 1

## 2022-01-23 MED ORDER — MORPHINE SULFATE (PF) 2 MG/ML IV SOLN: 1.0000 mg | Freq: Once | INTRAVENOUS | Status: DC

## 2022-01-23 NOTE — Progress Notes (Signed)
[facility]  PROGRESS NOTE  Tanner [name] CHE:[id number] DOB: Oct 10, 1965 DOA: 01/17/2022 PCP: Patient, No Pcp Per (Inactive)   Brief HPI:    56 year old male, single, independent, currently unemployed, medical history significant for polysubstance abuse (tobacco, IVDA-crystal meth), type II DM, HTN, hepatitis C, cellulitis of right foot, opioid dependence, periodontal disease presented to the ED for the second time in a week with complaints of progressively worsening low back pain x2 weeks.  MRI L-spine confirms L4-5 facet septic arthritis with associated osteomyelitis and multiloculated left erector spinae muscle abscess (12 mL). BCID positive for MSSA.    Neurosurgery and ID consulted.  Currently on empiric IV cefazolin.    Subjective   Patient seen and examined, pain has improved.   Assessment/Plan:    Septic arthritis/osteomyelitis of L4-5 facet.  Multiloculated left erector spinae muscle abscess -Secondary to IV drug use with MSSA bacteremia -TTE shows possible vegetation on tricuspid valve -Currently on IV cefazolin -Neurosurgery was consulted, no plan for surgical intervention -Continue tramadol as needed  MSSA bacteremia -TTE confirms possibility of tricuspid valve vegetation -ID recommends to continue with IV cefazolin, no indication for TEE as patient is going to be on prolonged antibiotics course due to septic arthritis -Continue IV cefazolin per ID  Opioid-induced constipation -Had BM this morning -Started on  MiraLAX 17 g daily -Also started on 1 tablet Senokot-S tablet twice daily  Diabetes mellitus type 2 -CBG has been elevated -Change Lantus to 12 units subcu daily -Sliding scale insulin with NovoLog -Hemoglobin A1c 10.8  Hyponatremia -Sodium is  still hovering around 1 26-1 30 -Today sodium is 131 -Likely SIADH -Continue fluid restriction 1.5 L/day  Microcytic anemia -Hemoglobin stable at 11.8 -We will need further evaluation with anemia panel  once infection subsides  Hypomagnesemia -Replete  Moderate protein calorie malnutrition -Dietitian consulted  Polysubstance abuse -Patient did IV crystal meth 4 to 5 days prior to admission -Cessation counseling provided -Continue nicotine patch   Medications     insulin aspart  0-15 Units Subcutaneous TID WC   insulin aspart  0-5 Units Subcutaneous QHS   insulin glargine-yfgn  12 Units Subcutaneous Daily   multivitamin with minerals  1 tablet Oral Daily   nicotine  21 mg Transdermal Daily   pneumococcal 20-valent conjugate vaccine  0.5 mL Intramuscular Tomorrow-1000   Ensure Max Protein  11 oz Oral BID   senna-docusate  1 tablet Oral BID   traZODone  50 mg Oral QHS     Data Reviewed:   CBG:  Recent Labs  Lab 01/22/22 0722 01/22/22 1706 01/22/22 2059 01/23/22 0742 01/23/22 1143  GLUCAP 156* 290* 237* 152* 220*    SpO2: 100 %    Vitals:   01/22/22 0606 01/22/22 1417 01/22/22 2103 01/23/22 0419  BP: 136/76 126/79 (!) 141/77 (!) 141/73  Pulse: 61 (!) 57 (!) 56 (!) 58  Resp: 18 14 16 18   Temp: 98.6 F (37 C) (!) 97.5 F (36.4 C) 98 F (36.7 C) (!) 97.5 F (36.4 C)  TempSrc: Oral Oral Oral Oral  SpO2: 98% 100% 100% 100%  Weight:      Height:          Data Reviewed:  Basic Metabolic Panel: Recent Labs  Lab 01/17/22 0722 01/18/22 0331 01/19/22 0939 01/20/22 0807 01/21/22 0439 01/23/22 1128  NA 124* 125* 129* 126* 131* 132*  K 3.7 4.5 3.8 4.0 3.9 4.3  CL 88* 93* 98 95* 95* 97*  CO2 25 24 24 22 26  27  GLUCOSE 460* 324* 202* 269* 148* 238*  BUN 20 20 13 12 12 15   CREATININE 0.84 0.84 0.69 0.55* 0.54* 0.72  CALCIUM 8.5* 7.8* 7.9* 7.9* 8.4* 8.4*  MG 1.6* 1.9  --   --   --   --   PHOS 3.0  --   --   --   --   --     CBC: Recent Labs  Lab 01/17/22 0722 01/18/22 0331 01/19/22 0939  WBC 11.3* 14.1* 13.6*  NEUTROABS 9.5*  --   --   HGB 11.1* 11.4* 11.8*  HCT 31.4* 32.7* 33.6*  MCV 78.9* 79.8* 79.4*  PLT 258 304 325    LFT Recent Labs   Lab 01/17/22 0722 01/18/22 0331 01/21/22 0439  AST 16 15 14*  ALT 16 15 12   ALKPHOS 109 108 105  BILITOT 0.8 0.8 0.3  PROT 6.8 6.1* 6.5  ALBUMIN 2.7* 2.4* 2.4*     Antibiotics: Anti-infectives (From admission, onward)    Start     Dose/Rate Route Frequency Ordered Stop   01/18/22 0600  ceFAZolin (ANCEF) IVPB 2g/100 mL premix        2 g 200 mL/hr over 30 Minutes Intravenous Every 8 hours 01/18/22 0330     01/18/22 0200  vancomycin (VANCOREADY) IVPB 1250 mg/250 mL  Status:  Discontinued        1,250 mg 166.7 mL/hr over 90 Minutes Intravenous Every 12 hours 01/17/22 1741 01/18/22 0330   01/17/22 2200  ceFEPIme (MAXIPIME) 2 g in sodium chloride 0.9 % 100 mL IVPB  Status:  Discontinued        2 g 200 mL/hr over 30 Minutes Intravenous Every 8 hours 01/17/22 1645 01/18/22 0330   01/17/22 1400  vancomycin (VANCOREADY) IVPB 1500 mg/300 mL        1,500 mg 150 mL/hr over 120 Minutes Intravenous  Once 01/17/22 1323 01/17/22 1629   01/17/22 1330  ceFEPIme (MAXIPIME) 2 g in sodium chloride 0.9 % 100 mL IVPB        2 g 200 mL/hr over 30 Minutes Intravenous  Once 01/17/22 1323 01/17/22 1629        DVT prophylaxis: SCDs  Code Status: Full code  Family Communication: No family at bedside   CONSULTS neurosurgery, infectious disease   Objective    Physical Examination:  General-appears in no acute distress Heart-S1-S2, regular, no murmur auscultated Lungs-clear to auscultation bilaterally, no wheezing or crackles auscultated Abdomen-soft, nontender, no organomegaly Extremities-no edema in the lower extremities Neuro-alert, oriented x3, no focal deficit noted  Status is: Inpatient: On IV antibiotics for lumbar septic arthritis/abscess        03/19/22   [facility] If 7PM-7AM, please contact night-coverage at [email or web address], Office  [phone]   01/23/2022, 2:38 PM  LOS: 6 days

## 2022-01-23 NOTE — Plan of Care (Signed)
  Problem: Metabolic: Goal: Ability to maintain appropriate glucose levels will improve Outcome: Not Progressing   

## 2022-01-24 LAB — CULTURE, BLOOD (ROUTINE X 2)
Culture: NO GROWTH
Culture: NO GROWTH
Special Requests: ADEQUATE
Special Requests: ADEQUATE

## 2022-01-24 LAB — GLUCOSE, CAPILLARY
Glucose-Capillary: 150 mg/dL — ABNORMAL HIGH (ref 70–99)
Glucose-Capillary: 175 mg/dL — ABNORMAL HIGH (ref 70–99)
Glucose-Capillary: 203 mg/dL — ABNORMAL HIGH (ref 70–99)
Glucose-Capillary: 198 mg/dL — ABNORMAL HIGH (ref 70–99)

## 2022-01-24 MED ORDER — KETOROLAC TROMETHAMINE 30 MG/ML IJ SOLN
30.0000 mg | Freq: Four times a day (QID) | INTRAMUSCULAR | Status: AC | PRN
  Administered 2022-01-24 – 2022-01-29 (×17): 30 mg via INTRAVENOUS
  Filled 2022-01-24 (×17): qty 1

## 2022-01-24 NOTE — Plan of Care (Signed)
Floor coverage note  Called for patient with intractable unrelenting pain unresolved with tramadol  Chart review: 56 year old male with history of IVDA admitted with septic arthritis/osteomyelitis of L4-5 facet with multiloculated left erector spinae muscle abscess.  Also has MSSA bacteremia and possible vegetation on tricuspid valve  A/P: Intractable pain unrelieved with tramadol: - Noted history of IV drug use so will try Toradol and if unrelieved we will give a stronger opioid

## 2022-01-24 NOTE — Progress Notes (Signed)
24 hour chart audit completed 

## 2022-01-24 NOTE — Progress Notes (Signed)
[facility]  PROGRESS NOTE  Tanner [name] PPJ:[id number] DOB: 08-07-1966 DOA: 01/17/2022 PCP: Patient, No Pcp Per (Inactive)   Brief HPI:    56 year old male, single, independent, currently unemployed, medical history significant for polysubstance abuse (tobacco, IVDA-crystal meth), type II DM, HTN, hepatitis C, cellulitis of right foot, opioid dependence, periodontal disease presented to the ED for the second time in a week with complaints of progressively worsening low back pain x2 weeks.  MRI L-spine confirms L4-5 facet septic arthritis with associated osteomyelitis and multiloculated left erector spinae muscle abscess (12 mL). BCID positive for MSSA.    Neurosurgery and ID consulted.  Currently on empiric IV cefazolin.    Subjective   Patient seen and examined, denies pain.   Assessment/Plan:    Septic arthritis/osteomyelitis of L4-5 facet.  Multiloculated left erector spinae muscle abscess -Secondary to IV drug use with MSSA bacteremia -TTE shows possible vegetation on tricuspid valve -Currently on IV cefazolin -Neurosurgery was consulted, no plan for surgical intervention -Continue tramadol as needed -Received Toradol last night with improvement in pain  MSSA bacteremia -TTE confirms possibility of tricuspid valve vegetation -ID recommends to continue with IV cefazolin, no indication for TEE as patient is going to be on prolonged antibiotics course due to septic arthritis -Continue IV cefazolin per ID  Opioid-induced constipation -Had BM this morning -Started on  MiraLAX 17 g daily -Also started on 1 tablet Senokot-S tablet twice daily  Diabetes mellitus type 2 -CBG well controlled -Change Lantus to 12 units subcu daily -Sliding scale insulin with NovoLog -Hemoglobin A1c 10.8  Hyponatremia -Sodium is  still hovering around 1 26-1 30 -Today sodium is improved to 132  Microcytic anemia -Hemoglobin stable at 11.8 -We will need further evaluation with anemia  panel once infection subsides  Hypomagnesemia -Replete  Moderate protein calorie malnutrition -Dietitian consulted  Polysubstance abuse -Patient did IV crystal meth 4 to 5 days prior to admission -Cessation counseling provided -Continue nicotine patch   Medications     insulin aspart  0-15 Units Subcutaneous TID WC   insulin aspart  0-5 Units Subcutaneous QHS   insulin glargine-yfgn  12 Units Subcutaneous Daily    morphine injection  1 mg Intravenous Once   multivitamin with minerals  1 tablet Oral Daily   nicotine  21 mg Transdermal Daily   pneumococcal 20-valent conjugate vaccine  0.5 mL Intramuscular Tomorrow-1000   Ensure Max Protein  11 oz Oral BID   senna-docusate  1 tablet Oral BID   traZODone  50 mg Oral QHS     Data Reviewed:   CBG:  Recent Labs  Lab 01/23/22 1143 01/23/22 1706 01/23/22 2147 01/24/22 0749 01/24/22 1202  GLUCAP 220* 189* 273* 150* 175*    SpO2: 99 %    Vitals:   01/23/22 0419 01/23/22 1503 01/23/22 2150 01/24/22 0617  BP: (!) 141/73 (!) 160/89 (!) 159/87 (!) 150/80  Pulse: (!) 58 64 64 60  Resp: 18 16 18 18   Temp: (!) 97.5 F (36.4 C) 97.6 F (36.4 C) 98.3 F (36.8 C) 98.1 F (36.7 C)  TempSrc: Oral Oral Oral Oral  SpO2: 100% 99% 100% 99%  Weight:      Height:          Data Reviewed:  Basic Metabolic Panel: Recent Labs  Lab 01/18/22 0331 01/19/22 0939 01/20/22 0807 01/21/22 0439 01/23/22 1128  NA 125* 129* 126* 131* 132*  K 4.5 3.8 4.0 3.9 4.3  CL 93* 98 95* 95* 97*  CO2 24  24 22 26 27   GLUCOSE 324* 202* 269* 148* 238*  BUN 20 13 12 12 15   CREATININE 0.84 0.69 0.55* 0.54* 0.72  CALCIUM 7.8* 7.9* 7.9* 8.4* 8.4*  MG 1.9  --   --   --   --     CBC: Recent Labs  Lab 01/18/22 0331 01/19/22 0939  WBC 14.1* 13.6*  HGB 11.4* 11.8*  HCT 32.7* 33.6*  MCV 79.8* 79.4*  PLT 304 325    LFT Recent Labs  Lab 01/18/22 0331 01/21/22 0439  AST 15 14*  ALT 15 12  ALKPHOS 108 105  BILITOT 0.8 0.3  PROT 6.1*  6.5  ALBUMIN 2.4* 2.4*     Antibiotics: Anti-infectives (From admission, onward)    Start     Dose/Rate Route Frequency Ordered Stop   01/18/22 0600  ceFAZolin (ANCEF) IVPB 2g/100 mL premix        2 g 200 mL/hr over 30 Minutes Intravenous Every 8 hours 01/18/22 0330     01/18/22 0200  vancomycin (VANCOREADY) IVPB 1250 mg/250 mL  Status:  Discontinued        1,250 mg 166.7 mL/hr over 90 Minutes Intravenous Every 12 hours 01/17/22 1741 01/18/22 0330   01/17/22 2200  ceFEPIme (MAXIPIME) 2 g in sodium chloride 0.9 % 100 mL IVPB  Status:  Discontinued        2 g 200 mL/hr over 30 Minutes Intravenous Every 8 hours 01/17/22 1645 01/18/22 0330   01/17/22 1400  vancomycin (VANCOREADY) IVPB 1500 mg/300 mL        1,500 mg 150 mL/hr over 120 Minutes Intravenous  Once 01/17/22 1323 01/17/22 1629   01/17/22 1330  ceFEPIme (MAXIPIME) 2 g in sodium chloride 0.9 % 100 mL IVPB        2 g 200 mL/hr over 30 Minutes Intravenous  Once 01/17/22 1323 01/17/22 1629        DVT prophylaxis: SCDs  Code Status: Full code  Family Communication: No family at bedside   CONSULTS neurosurgery, infectious disease   Objective    Physical Examination:  General-appears in no acute distress Heart-S1-S2, regular, no murmur auscultated Lungs-clear to auscultation bilaterally, no wheezing or crackles auscultated Abdomen-soft, nontender, no organomegaly Extremities-no edema in the lower extremities Neuro-alert, oriented x3, no focal deficit noted  Status is: Inpatient: On IV antibiotics for lumbar septic arthritis/abscess        03/19/22   [facility] If 7PM-7AM, please contact night-coverage at [email or web address], Office  [phone]   01/24/2022, 12:33 PM  LOS: 7 days

## 2022-01-25 DIAGNOSIS — E1165 Type 2 diabetes mellitus with hyperglycemia: Secondary | ICD-10-CM

## 2022-01-25 DIAGNOSIS — Z794 Long term (current) use of insulin: Secondary | ICD-10-CM

## 2022-01-25 DIAGNOSIS — I10 Essential (primary) hypertension: Secondary | ICD-10-CM

## 2022-01-25 DIAGNOSIS — M869 Osteomyelitis, unspecified: Secondary | ICD-10-CM

## 2022-01-25 LAB — GLUCOSE, CAPILLARY
Glucose-Capillary: 174 mg/dL — ABNORMAL HIGH (ref 70–99)
Glucose-Capillary: 186 mg/dL — ABNORMAL HIGH (ref 70–99)
Glucose-Capillary: 231 mg/dL — ABNORMAL HIGH (ref 70–99)
Glucose-Capillary: 279 mg/dL — ABNORMAL HIGH (ref 70–99)

## 2022-01-25 MED ORDER — AMLODIPINE BESYLATE 5 MG PO TABS
5.0000 mg | ORAL_TABLET | Freq: Every day | ORAL | Status: DC
  Administered 2022-01-25 – 2022-02-19 (×26): 5 mg via ORAL
  Filled 2022-01-25 (×26): qty 1

## 2022-01-25 NOTE — Progress Notes (Signed)
[facility]  PROGRESS NOTE  Tanner [name] JKK:[id number] DOB: Mar 19, 1966 DOA: 01/17/2022 PCP: Tanner [name], No Pcp Per (Inactive)   Brief HPI:    56 year old male, single, independent, currently unemployed, medical history significant for polysubstance abuse (tobacco, IVDA-crystal meth), type II DM, HTN, hepatitis C, cellulitis of right foot, opioid dependence, periodontal disease presented to the ED for the second time in a week with complaints of progressively worsening low back pain x2 weeks.  MRI L-spine confirms L4-5 facet septic arthritis with associated osteomyelitis and multiloculated left erector spinae muscle abscess (12 mL). BCID positive for MSSA.    Neurosurgery and ID consulted.  Currently on empiric IV cefazolin.    Subjective   Tanner [name] seen and examined, denies pain.   Assessment/Plan:    Septic arthritis/osteomyelitis of L4-5 facet.  Multiloculated left erector spinae muscle abscess -Secondary to IV drug use with MSSA bacteremia -TTE shows possible vegetation on tricuspid valve -Currently on IV cefazolin -Neurosurgery was consulted, no plan for surgical intervention -Continue tramadol as needed -Pain improved on IV Toradol as needed   MSSA bacteremia -TTE confirms possibility of tricuspid valve vegetation -ID recommends to continue with IV cefazolin, no indication for TEE as Tanner [name] is going to be on prolonged antibiotics course due to septic arthritis -Continue IV cefazolin per ID  Opioid-induced constipation -Had BM this morning -Started on  MiraLAX 17 g daily -Also started on 1 tablet Senokot-S tablet twice daily  Diabetes mellitus type 2 -CBG well controlled -Changed Lantus to 12 units subcu daily -Sliding scale insulin with NovoLog -Hemoglobin A1c 10.8  Hyponatremia -Sodium improved 132  Hypertension -Blood pressure has been well elevated -Start amlodipine 5 mg p.o. daily  Microcytic anemia -Hemoglobin stable at 11.8 -We will need further  evaluation with anemia panel once infection subsides  Hypomagnesemia -Replete  Moderate protein calorie malnutrition -Dietitian consulted  Polysubstance abuse -Tanner [name] did IV crystal meth 4 to 5 days prior to admission -Cessation counseling provided -Continue nicotine patch   Medications     insulin aspart  0-15 Units Subcutaneous TID WC   insulin aspart  0-5 Units Subcutaneous QHS   insulin glargine-yfgn  12 Units Subcutaneous Daily    morphine injection  1 mg Intravenous Once   multivitamin with minerals  1 tablet Oral Daily   nicotine  21 mg Transdermal Daily   pneumococcal 20-valent conjugate vaccine  0.5 mL Intramuscular Tomorrow-1000   Ensure Max Protein  11 oz Oral BID   senna-docusate  1 tablet Oral BID   traZODone  50 mg Oral QHS     Data Reviewed:   CBG:  Recent Labs  Lab 01/24/22 1202 01/24/22 1618 01/24/22 2053 01/25/22 0731 01/25/22 1154  GLUCAP 175* 203* 198* 174* 186*    SpO2: 98 %    Vitals:   01/24/22 1415 01/24/22 2049 01/25/22 0553 01/25/22 1316  BP: 138/68 (!) 151/71 (!) 161/83 (!) 151/75  Pulse: (!) 58 69 (!) 54 (!) 52  Resp: 16 18 18 16   Temp: (!) 97.5 F (36.4 C) 98.2 F (36.8 C) 98 F (36.7 C) 97.8 F (36.6 C)  TempSrc:  Oral Oral Oral  SpO2: 99% 99% 99% 98%  Weight:      Height:          Data Reviewed:  Basic Metabolic Panel: Recent Labs  Lab 01/19/22 0939 01/20/22 0807 01/21/22 0439 01/23/22 1128  NA 129* 126* 131* 132*  K 3.8 4.0 3.9 4.3  CL 98 95* 95* 97*  CO2 24 22 26  27  GLUCOSE 202* 269* 148* 238*  BUN 13 12 12 15   CREATININE 0.69 0.55* 0.54* 0.72  CALCIUM 7.9* 7.9* 8.4* 8.4*    CBC: Recent Labs  Lab 01/19/22 0939  WBC 13.6*  HGB 11.8*  HCT 33.6*  MCV 79.4*  PLT 325    LFT Recent Labs  Lab 01/21/22 0439  AST 14*  ALT 12  ALKPHOS 105  BILITOT 0.3  PROT 6.5  ALBUMIN 2.4*     Antibiotics: Anti-infectives (From admission, onward)    Start     Dose/Rate Route Frequency Ordered Stop    01/18/22 0600  ceFAZolin (ANCEF) IVPB 2g/100 mL premix        2 g 200 mL/hr over 30 Minutes Intravenous Every 8 hours 01/18/22 0330     01/18/22 0200  vancomycin (VANCOREADY) IVPB 1250 mg/250 mL  Status:  Discontinued        1,250 mg 166.7 mL/hr over 90 Minutes Intravenous Every 12 hours 01/17/22 1741 01/18/22 0330   01/17/22 2200  ceFEPIme (MAXIPIME) 2 g in sodium chloride 0.9 % 100 mL IVPB  Status:  Discontinued        2 g 200 mL/hr over 30 Minutes Intravenous Every 8 hours 01/17/22 1645 01/18/22 0330   01/17/22 1400  vancomycin (VANCOREADY) IVPB 1500 mg/300 mL        1,500 mg 150 mL/hr over 120 Minutes Intravenous  Once 01/17/22 1323 01/17/22 1629   01/17/22 1330  ceFEPIme (MAXIPIME) 2 g in sodium chloride 0.9 % 100 mL IVPB        2 g 200 mL/hr over 30 Minutes Intravenous  Once 01/17/22 1323 01/17/22 1629        DVT prophylaxis: SCDs  Code Status: Full code  Family Communication: No family at bedside   CONSULTS neurosurgery, infectious disease   Objective    Physical Examination:  General-appears in no acute distress Heart-S1-S2, regular, no murmur auscultated Lungs-clear to auscultation bilaterally, no wheezing or crackles auscultated Abdomen-soft, nontender, no organomegaly Extremities-no edema in the lower extremities Neuro-alert, oriented x3, no focal deficit noted  Status is: Inpatient: On IV antibiotics for lumbar septic arthritis/abscess        03/19/22   [facility] If 7PM-7AM, please contact night-coverage at [email or web address], Office  [phone]   01/25/2022, 2:18 PM  LOS: 8 days

## 2022-01-25 NOTE — Progress Notes (Signed)
Patient was standing in bathroom with blood dripping down his right leg beside the knee. I asked patient what happened and he explained he was "cleaning up some sores" Patient then asked to take a shower. MD was notified and came down to observe the "sores"

## 2022-01-26 DIAGNOSIS — R7881 Bacteremia: Secondary | ICD-10-CM | POA: Diagnosis present

## 2022-01-26 LAB — GLUCOSE, CAPILLARY
Glucose-Capillary: 109 mg/dL — ABNORMAL HIGH (ref 70–99)
Glucose-Capillary: 179 mg/dL — ABNORMAL HIGH (ref 70–99)
Glucose-Capillary: 261 mg/dL — ABNORMAL HIGH (ref 70–99)
Glucose-Capillary: 241 mg/dL — ABNORMAL HIGH (ref 70–99)

## 2022-01-26 MED ORDER — ALPRAZOLAM 0.5 MG PO TABS
0.5000 mg | ORAL_TABLET | Freq: Every evening | ORAL | Status: DC | PRN
  Administered 2022-01-26 – 2022-02-18 (×24): 0.5 mg via ORAL
  Filled 2022-01-26 (×25): qty 1

## 2022-01-26 NOTE — Progress Notes (Signed)
Nutrition Follow-up  INTERVENTION:   -Ensure MAX Protein po BID, each supplement provides 150 kcal and 30 grams of protein   -Multivitamin with minerals daily  NUTRITION DIAGNOSIS:   Increased nutrient needs related to acute illness (polysubstance abuse) as evidenced by estimated needs.  Ongoing.  GOAL:   Patient will meet greater than or equal to 90% of their needs  Progressing.  MONITOR:   PO intake, Supplement acceptance, Weight trends, Labs, I & O's, Skin  ASSESSMENT:   56 year old male, single, independent, currently unemployed, medical history significant for polysubstance abuse (tobacco, IVDA-crystal meth), type II DM, HTN, hepatitis C, cellulitis of right foot, opioid dependence, periodontal disease presented to the ED for the second time in a week with complaints of progressively worsening low back pain x2 weeks.  MRI L-spine confirms L4-5 facet septic arthritis with associated osteomyelitis and multiloculated left erector spinae muscle abscess (12 mL).  Patient currently consuming 100% of meals at this time. Accepting at least 1 Ensure Max daily. Noted that at times supplement was not provided given pt's diabetes. Ensure Max is appropriate for pt's with diabetes as it is low in carbohydrates (6g CHO per bottle).  Admission weight: 169 lbs. No other weights this admission.  Medications: Multivitamin with minerals daily, Senokot  Labs reviewed:  CBGs: 109-279  Diet Order:   Diet Order             Diet Carb Modified Fluid consistency: Thin; Room service appropriate? Yes; Fluid restriction: 1500 mL Fluid  Diet effective now                   EDUCATION NEEDS:   Not appropriate for education at this time  Skin:  Skin Assessment: Skin Integrity Issues: Skin Integrity Issues:: Other (Comment) Other: cellulitis of right foot  Last BM:  6/13 -type 4  Height:   Ht Readings from Last 1 Encounters:  01/17/22 6' (1.829 m)    Weight:   Wt Readings from  Last 1 Encounters:  01/17/22 77 kg    BMI:  Body mass index is 23.02 kg/m.  Estimated Nutritional Needs:   Kcal:  2300-2500  Protein:  115-130g  Fluid:  2.3L/day   Tilda Franco, MS, RD, LDN Inpatient Clinical Dietitian Contact information available via Amion

## 2022-01-26 NOTE — Progress Notes (Signed)
[facility]  PROGRESS NOTE  Pleasant Bensinger PTW:[id number] DOB: October 26, 1965 DOA: 01/17/2022 PCP: Tanner [name], No Pcp Per (Inactive)   Brief HPI:    56 year old male, single, independent, currently unemployed, medical history significant for polysubstance abuse (tobacco, IVDA-crystal meth), type II DM, HTN, hepatitis C, cellulitis of right foot, opioid dependence, periodontal disease presented to the ED for the second time in a week with complaints of progressively worsening low back pain x2 weeks.  MRI L-spine confirms L4-5 facet septic arthritis with associated osteomyelitis and multiloculated left erector spinae muscle abscess (12 mL). BCID positive for MSSA.    Neurosurgery and ID consulted.  Currently on empiric IV cefazolin.    Subjective   Tanner [name] seen and examined, denies pain.   Assessment/Plan:    Septic arthritis/osteomyelitis of L4-5 facet.  Multiloculated left erector spinae muscle abscess -Secondary to IV drug use with MSSA bacteremia -TTE shows possible vegetation on tricuspid valve -Currently on IV cefazolin -Neurosurgery was consulted, no plan for surgical intervention -Continue tramadol as needed -Pain improved on IV Toradol as needed   MSSA bacteremia -TTE confirms possibility of tricuspid valve vegetation -ID recommends to continue with IV cefazolin, no indication for TEE as Tanner [name] is going to be on prolonged antibiotics course due to septic arthritis -Continue IV cefazolin per ID  Opioid-induced constipation -Had BM this morning -Started on  MiraLAX 17 g daily -Also started on 1 tablet Senokot-S tablet twice daily  Diabetes mellitus type 2 -CBG well controlled -Changed Lantus to 12 units subcu daily -Sliding scale insulin with NovoLog -Hemoglobin A1c 10.8  Hyponatremia -Sodium improved 132  Hypertension -Blood pressure has been well elevated -Start amlodipine 5 mg p.o. daily  Microcytic anemia -Hemoglobin stable at 11.8 -We will need further  evaluation with anemia panel once infection subsides  Hypomagnesemia -Replete  Moderate protein calorie malnutrition -Dietitian consulted  Polysubstance abuse -Tanner [name] did IV crystal meth 4 to 5 days prior to admission -Cessation counseling provided -Continue nicotine patch   Medications     amLODipine  5 mg Oral Daily   insulin aspart  0-15 Units Subcutaneous TID WC   insulin aspart  0-5 Units Subcutaneous QHS   insulin glargine-yfgn  12 Units Subcutaneous Daily    morphine injection  1 mg Intravenous Once   multivitamin with minerals  1 tablet Oral Daily   nicotine  21 mg Transdermal Daily   pneumococcal 20-valent conjugate vaccine  0.5 mL Intramuscular Tomorrow-1000   Ensure Max Protein  11 oz Oral BID   senna-docusate  1 tablet Oral BID     Data Reviewed:   CBG:  Recent Labs  Lab 01/25/22 1154 01/25/22 1715 01/25/22 2047 01/26/22 0755 01/26/22 1132  GLUCAP 186* 231* 279* 109* 179*    SpO2: 100 %    Vitals:   01/25/22 1316 01/25/22 2044 01/26/22 0552 01/26/22 1346  BP: (!) 151/75 (!) 160/79 139/88 (!) 145/79  Pulse: (!) 52 72 60 63  Resp: 16 19  16   Temp: 97.8 F (36.6 C) 98.4 F (36.9 C) (!) 97.5 F (36.4 C) (!) 97.3 F (36.3 C)  TempSrc: Oral  Oral Oral  SpO2: 98% 100% 98% 100%  Weight:      Height:          Data Reviewed:  Basic Metabolic Panel: Recent Labs  Lab 01/20/22 0807 01/21/22 0439 01/23/22 1128  NA 126* 131* 132*  K 4.0 3.9 4.3  CL 95* 95* 97*  CO2 22 26 27   GLUCOSE 269* 148* 238*  BUN 12 12 15   CREATININE 0.55* 0.54* 0.72  CALCIUM 7.9* 8.4* 8.4*    CBC: No results for input(s): "WBC", "NEUTROABS", "HGB", "HCT", "MCV", "PLT" in the last 168 hours.   LFT Recent Labs  Lab 01/21/22 0439  AST 14*  ALT 12  ALKPHOS 105  BILITOT 0.3  PROT 6.5  ALBUMIN 2.4*     Antibiotics: Anti-infectives (From admission, onward)    Start     Dose/Rate Route Frequency Ordered Stop   01/18/22 0600  ceFAZolin (ANCEF) IVPB  2g/100 mL premix        2 g 200 mL/hr over 30 Minutes Intravenous Every 8 hours 01/18/22 0330     01/18/22 0200  vancomycin (VANCOREADY) IVPB 1250 mg/250 mL  Status:  Discontinued        1,250 mg 166.7 mL/hr over 90 Minutes Intravenous Every 12 hours 01/17/22 1741 01/18/22 0330   01/17/22 2200  ceFEPIme (MAXIPIME) 2 g in sodium chloride 0.9 % 100 mL IVPB  Status:  Discontinued        2 g 200 mL/hr over 30 Minutes Intravenous Every 8 hours 01/17/22 1645 01/18/22 0330   01/17/22 1400  vancomycin (VANCOREADY) IVPB 1500 mg/300 mL        1,500 mg 150 mL/hr over 120 Minutes Intravenous  Once 01/17/22 1323 01/17/22 1629   01/17/22 1330  ceFEPIme (MAXIPIME) 2 g in sodium chloride 0.9 % 100 mL IVPB        2 g 200 mL/hr over 30 Minutes Intravenous  Once 01/17/22 1323 01/17/22 1629        DVT prophylaxis: SCDs  Code Status: Full code  Family Communication: No family at bedside   CONSULTS neurosurgery, infectious disease   Objective    Physical Examination:  General-appears in no acute distress Heart-S1-S2, regular, no murmur auscultated Lungs-clear to auscultation bilaterally, no wheezing or crackles auscultated Abdomen-soft, nontender, no organomegaly Extremities-no edema in the lower extremities Neuro-alert, oriented x3, no focal deficit noted  Status is: Inpatient: On IV antibiotics for lumbar septic arthritis/abscess        03/19/22   [facility] If 7PM-7AM, please contact night-coverage at [email or web address], Office  [phone]   01/26/2022, 4:18 PM  LOS: 9 days

## 2022-01-27 DIAGNOSIS — R7881 Bacteremia: Secondary | ICD-10-CM

## 2022-01-27 DIAGNOSIS — M47819 Spondylosis without myelopathy or radiculopathy, site unspecified: Secondary | ICD-10-CM

## 2022-01-27 LAB — GLUCOSE, CAPILLARY
Glucose-Capillary: 178 mg/dL — ABNORMAL HIGH (ref 70–99)
Glucose-Capillary: 136 mg/dL — ABNORMAL HIGH (ref 70–99)
Glucose-Capillary: 263 mg/dL — ABNORMAL HIGH (ref 70–99)
Glucose-Capillary: 278 mg/dL — ABNORMAL HIGH (ref 70–99)

## 2022-01-27 MED ORDER — TRAZODONE HCL 50 MG PO TABS
50.0000 mg | ORAL_TABLET | Freq: Every day | ORAL | Status: DC
Start: 2022-01-27 — End: 2022-01-27

## 2022-01-27 MED ORDER — MELATONIN 5 MG PO TABS
5.0000 mg | ORAL_TABLET | Freq: Every day | ORAL | Status: DC
  Administered 2022-01-27 – 2022-02-18 (×23): 5 mg via ORAL
  Filled 2022-01-27 (×23): qty 1

## 2022-01-27 MED ORDER — CYCLOBENZAPRINE HCL 10 MG PO TABS
10.0000 mg | ORAL_TABLET | Freq: Three times a day (TID) | ORAL | Status: DC
  Administered 2022-01-27 – 2022-02-19 (×71): 10 mg via ORAL
  Filled 2022-01-27 (×72): qty 1

## 2022-01-27 MED ORDER — CYCLOBENZAPRINE HCL 5 MG PO TABS
5.0000 mg | ORAL_TABLET | Freq: Three times a day (TID) | ORAL | Status: DC
Start: 2022-01-27 — End: 2022-01-27
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
## 2022-01-27 NOTE — Progress Notes (Signed)
[facility]   Reason for visit: Follow up on facet arthritis with abscess  Interval History: no associated rash or diarrhea.  Still with back pain. Remains afebrile.   Day 11 cefazolin  Physical Exam: Constitutional:  Vitals:   01/26/22 1949 01/27/22 0642  BP: 133/77 (!) 142/75  Pulse: 61 61  Resp: 18 18  Temp: 97.9 F (36.6 C) 97.8 F (36.6 C)  SpO2: 99% 98%   patient appears in NAD Respiratory: Normal respiratory effort; CTA B Skin: no rashes  Review of Systems: Constitutional: negative for fevers and chills  Lab Results  Component Value Date   WBC 13.6 (H) 01/19/2022   HGB 11.8 (L) 01/19/2022   HCT 33.6 (L) 01/19/2022   MCV 79.4 (L) 01/19/2022   PLT 325 01/19/2022    Lab Results  Component Value Date   CREATININE 0.72 01/23/2022   BUN 15 01/23/2022   NA 132 (L) 01/23/2022   K 4.3 01/23/2022   CL 97 (L) 01/23/2022   CO2 27 01/23/2022    Lab Results  Component Value Date   ALT 12 01/21/2022   AST 14 (L) 01/21/2022   ALKPHOS 105 01/21/2022     Microbiology: Recent Results (from the past 240 hour(s))  Blood culture (routine x 2)     Status: Abnormal   Collection Time: 01/17/22  1:40 PM   Specimen: BLOOD  Result Value Ref Range Status   Specimen Description   Final    BLOOD BLOOD LEFT FOREARM Performed at [facility], [address] [place], [place] [address]    Special Requests   Final    BOTTLES DRAWN AEROBIC AND ANAEROBIC Blood Culture adequate volume Performed at [facility], [address], [place], [place] [address]    Culture  Setup Time   Final    GRAM POSITIVE COCCI ANAEROBIC BOTTLE ONLY IN BOTH AEROBIC AND ANAEROBIC BOTTLES CRITICAL RESULT CALLED TO, READ BACK BY AND VERIFIED WITH: PHARMD MICHELLE LILLISTON 01/18/22@3 :11 BY TW Performed at [facility], [address] [place], [place] [address]    Culture STAPHYLOCOCCUS AUREUS (A)  Final   Report Status  01/20/2022 FINAL  Final   Organism ID, Bacteria STAPHYLOCOCCUS AUREUS  Final      Susceptibility   Staphylococcus aureus - MIC*    CIPROFLOXACIN <=0.5 SENSITIVE Sensitive     ERYTHROMYCIN <=0.25 SENSITIVE Sensitive     GENTAMICIN <=0.5 SENSITIVE Sensitive     OXACILLIN <=0.25 SENSITIVE Sensitive     TETRACYCLINE <=1 SENSITIVE Sensitive     VANCOMYCIN 1 SENSITIVE Sensitive     TRIMETH/SULFA <=10 SENSITIVE Sensitive     CLINDAMYCIN <=0.25 SENSITIVE Sensitive     RIFAMPIN <=0.5 SENSITIVE Sensitive     Inducible Clindamycin NEGATIVE Sensitive     * STAPHYLOCOCCUS AUREUS  Blood Culture ID Panel (Reflexed)     Status: Abnormal   Collection Time: 01/17/22  1:40 PM  Result Value Ref Range Status   Enterococcus faecalis NOT DETECTED NOT DETECTED Final   Enterococcus Faecium NOT DETECTED NOT DETECTED Final   Listeria monocytogenes NOT DETECTED NOT DETECTED Final   Staphylococcus species DETECTED (A) NOT DETECTED Final    Comment: CRITICAL RESULT CALLED TO, READ BACK BY AND VERIFIED WITH: PHARMD MICHELLE LILLISTON 01/18/22@3 :11 BY TW    Staphylococcus aureus (BCID) DETECTED (A) NOT DETECTED Final    Comment: CRITICAL RESULT CALLED TO, READ BACK BY AND VERIFIED WITH: PHARMD MICHELLE LILLISTON 01/18/22@3 :11 BY TW  Staphylococcus epidermidis NOT DETECTED NOT DETECTED Final   Staphylococcus lugdunensis NOT DETECTED NOT DETECTED Final   Streptococcus species NOT DETECTED NOT DETECTED Final   Streptococcus agalactiae NOT DETECTED NOT DETECTED Final   Streptococcus pneumoniae NOT DETECTED NOT DETECTED Final   Streptococcus pyogenes NOT DETECTED NOT DETECTED Final   A.calcoaceticus-baumannii NOT DETECTED NOT DETECTED Final   Bacteroides fragilis NOT DETECTED NOT DETECTED Final   Enterobacterales NOT DETECTED NOT DETECTED Final   Enterobacter cloacae complex NOT DETECTED NOT DETECTED Final   Escherichia coli NOT DETECTED NOT DETECTED Final   Klebsiella aerogenes NOT DETECTED NOT DETECTED Final    Klebsiella oxytoca NOT DETECTED NOT DETECTED Final   Klebsiella pneumoniae NOT DETECTED NOT DETECTED Final   Proteus species NOT DETECTED NOT DETECTED Final   Salmonella species NOT DETECTED NOT DETECTED Final   Serratia marcescens NOT DETECTED NOT DETECTED Final   Haemophilus influenzae NOT DETECTED NOT DETECTED Final   Neisseria meningitidis NOT DETECTED NOT DETECTED Final   Pseudomonas aeruginosa NOT DETECTED NOT DETECTED Final   Stenotrophomonas maltophilia NOT DETECTED NOT DETECTED Final   Candida albicans NOT DETECTED NOT DETECTED Final   Candida auris NOT DETECTED NOT DETECTED Final   Candida glabrata NOT DETECTED NOT DETECTED Final   Candida krusei NOT DETECTED NOT DETECTED Final   Candida parapsilosis NOT DETECTED NOT DETECTED Final   Candida tropicalis NOT DETECTED NOT DETECTED Final   Cryptococcus neoformans/gattii NOT DETECTED NOT DETECTED Final   Meth resistant mecA/C and MREJ NOT DETECTED NOT DETECTED Final    Comment: Performed at [facility], [address] [place], [place] [address]  Blood culture (routine x 2)     Status: Abnormal   Collection Time: 01/17/22  1:50 PM   Specimen: BLOOD  Result Value Ref Range Status   Specimen Description   Final    BLOOD BLOOD RIGHT HAND Performed at [facility], [address] [place], [place] [address]    Special Requests   Final    BOTTLES DRAWN AEROBIC ONLY Blood Culture adequate volume Performed at [facility], [address] [place], [place] [address]    Culture  Setup Time   Final    GRAM POSITIVE COCCI AEROBIC BOTTLE ONLY CRITICAL VALUE NOTED.  VALUE IS CONSISTENT WITH PREVIOUSLY REPORTED AND CALLED VALUE.    Culture (A)  Final    STAPHYLOCOCCUS AUREUS SUSCEPTIBILITIES PERFORMED ON PREVIOUS CULTURE WITHIN THE LAST 5 DAYS. Performed at [facility], [address] [place], [place] [address]    Report Status 01/20/2022 FINAL  Final  Blood culture (routine x 2)      Status: Abnormal   Collection Time: 01/17/22  2:05 PM   Specimen: BLOOD  Result Value Ref Range Status   Specimen Description   Final    BLOOD RIGHT ANTECUBITAL Performed at [facility], [address] [place], [place] [address]    Special Requests   Final    BOTTLES DRAWN AEROBIC AND ANAEROBIC Blood Culture results may not be optimal due to an excessive volume of blood received in culture bottles Performed at [facility], [address] [place], [place] [address]    Culture  Setup Time   Final    GRAM POSITIVE COCCI AEROBIC BOTTLE ONLY CRITICAL VALUE NOTED.  VALUE IS CONSISTENT WITH PREVIOUSLY REPORTED AND CALLED VALUE. IN BOTH AEROBIC AND ANAEROBIC BOTTLES    Culture (A)  Final    STAPHYLOCOCCUS AUREUS SUSCEPTIBILITIES PERFORMED  ON PREVIOUS CULTURE WITHIN THE LAST 5 DAYS. Performed at [facility], [address], [place], [place] [address]    Report Status 01/20/2022 FINAL  Final  Culture, blood (Routine X 2) w Reflex to ID Panel     Status: None   Collection Time: 01/19/22  9:40 AM   Specimen: BLOOD  Result Value Ref Range Status   Specimen Description   Final    BLOOD RIGHT ANTECUBITAL Performed at [facility], [address] [place], [place] [address]    Special Requests   Final    BOTTLES DRAWN AEROBIC AND ANAEROBIC Blood Culture adequate volume Performed at [facility], [address], [place], [place] [address]    Culture   Final    NO GROWTH 5 DAYS Performed at [facility], [address] [place], [place] [address]    Report Status 01/24/2022 FINAL  Final  Culture, blood (Routine X 2) w Reflex to ID Panel     Status: None   Collection Time: 01/19/22  9:46 AM   Specimen: BLOOD  Result Value Ref Range Status   Specimen Description   Final    BLOOD BLOOD LEFT FOREARM Performed at [facility], [address] [place], [place] [address]    Special  Requests   Final    BOTTLES DRAWN AEROBIC AND ANAEROBIC Blood Culture adequate volume Performed at [facility], [address] [place], [place] [address]    Culture   Final    NO GROWTH 5 DAYS Performed at [facility], [address], [place], [place] [address]    Report Status 01/24/2022 FINAL  Final    Impression/Plan:  1. Facet arthritis with abscess - MSSA positive blood cultures and repeat blood cultures have remained negative at 5 days.  Tolerating cefazolin and will need a prolonged course.   Continue cefazolin.   After 2-3 weeks we will reevaluate for appropriateness of outpatient, non-IV antibiotic treatment if he continues to do well.    2.  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  On cefazolin and will continue.    Will continue to follow intermittently

## 2022-01-27 NOTE — Progress Notes (Signed)
  Progress Note Patient: Tanner [name] EXN:[id number] DOB: [birth year] DOA: 01/17/2022  DOS: the patient was seen and examined on 01/27/2022  Brief hospital course: Past medical history of type II DM, HTN, polysubstance abuse with IVDA-crystal meth, tobacco abuse, hep C treated present to the hospital with complaints of back pain. Work-up shows septic arthritis/osteomyelitis of L4-5 facet as well as left erector spinae muscle abscess with MSSA bacteremia and tricuspid valve vegetation. ID consulted currently on IV antibiotics plan is to continue IV antibiotics for 2 to 3 weeks and then consider possibility of oral antibiotic regimen. Continues to have pain on the left hip area Assessment and Plan: MSSA bacteremia. IV drug abuse. Septic arthritis/osteomyelitis of L4-5 facet. Left erector spinae muscle abscess. Presents with back pain. Work-up showed above. Blood cultures positive for MSSA. TTE confirms possibility of tricuspid valve vegetation. No indication for TEE as the patient is going to be on antibiotic for prolonged duration regardless. Currently on IV cefazolin. Plan is to continue IV antibiotics for 2 to 3 weeks and then transition to p.o. antibiotics possibly. May require imaging of the left hip area if continues to have pain.  Pain control. Difficulty in a patient with history of polysubstance abuse. We will continue current regimen but change Robaxin to Flexeril and change from as needed to scheduled dose. Continue Toradol. Monitor.  Insomnia. Patient reports difficulty sleeping.  Will add melatonin to current regimen.  Opioid-induced constipation. Continue stool softener.  Diabetes mellitus, well controlled without any complication with long-term insulin use. Currently on sliding scale insulin as well as bolus regimen. Continue the same. Hemoglobin A1c 10.8 recently.  Hyponatremia, hypomagnesemia. Continue to being corrected.  Monitor.  Essential  hypertension. Continuing Norvasc. Monitor.  Malnutrition ruled out. Appreciate dietary consultation. Body mass index is 23.02 kg/m. Nutrition Problem: Increased nutrient needs Etiology: acute illness (polysubstance abuse) Nutrition Interventions: Interventions: Ensure Enlive (each supplement provides 350kcal and 20 grams of protein), MVI, Liberalize Diet   Subjective: Continues to have back pain in the left lower area.  No nausea no vomiting no fever no chills.  No other acute complaints.  Physical Exam: Vitals:   01/26/22 1346 01/26/22 1949 01/27/22 0642 01/27/22 1325  BP: (!) 145/79 133/77 (!) 142/75 (!) 143/72  Pulse: 63 61 61 67  Resp: 16 18 18 17   Temp: (!) 97.3 F (36.3 C) 97.9 F (36.6 C) 97.8 F (36.6 C) 98.1 F (36.7 C)  TempSrc: Oral Oral Oral Oral  SpO2: 100% 99% 98% 98%  Weight:      Height:       General: Appear in moderate distress; no visible Abnormal Neck Mass Or lumps, Conjunctiva normal Cardiovascular: S1 and S2 Present, no Murmur, Respiratory: good respiratory effort, Bilateral Air entry present and CTA, no Crackles, no wheezes Abdomen: Bowel Sound present, Non tender  Extremities: no Pedal edema Neurology: alert and oriented to time, place, and person  Gait not checked due to patient safety concerns   Data Reviewed: I have Reviewed nursing notes, Vitals, and Lab results since pt's last encounter. Pertinent lab results CBG I have ordered test including CBC and BMP I have reviewed the last note from ID,    Family Communication: None at bedside  Disposition: Status is: Inpatient Remains inpatient appropriate because: Needing IV antibiotics.  Author: , MD 01/27/2022 7:19 PM  Please look on [email or web address] to find out who is on call.

## 2022-01-27 NOTE — Hospital Course (Addendum)
Past medical history of type II DM, HTN, polysubstance abuse with IVDA-crystal meth, tobacco abuse, hep C treated present to the hospital with complaints of back pain. Work-up shows septic arthritis/osteomyelitis of L4-5 facet as well as left erector spinae muscle abscess with MSSA bacteremia and tricuspid valve vegetation. ID consulted currently on IV antibiotics plan is to continue IV antibiotics for 2 to 3 weeks and then consider possibility of oral antibiotic regimen. Underwent ultrasound-guided drainage of the left erector spinae abscess on 6/20. Continues to have pain on the left hip area

## 2022-01-28 LAB — GLUCOSE, CAPILLARY
Glucose-Capillary: 222 mg/dL — ABNORMAL HIGH (ref 70–99)
Glucose-Capillary: 106 mg/dL — ABNORMAL HIGH (ref 70–99)
Glucose-Capillary: 169 mg/dL — ABNORMAL HIGH (ref 70–99)
Glucose-Capillary: 204 mg/dL — ABNORMAL HIGH (ref 70–99)

## 2022-01-28 LAB — CBC WITH DIFFERENTIAL/PLATELET
Abs Immature Granulocytes: 0.23 10*3/uL — ABNORMAL HIGH (ref 0.00–0.07)
Basophils Absolute: 0.1 10*3/uL (ref 0.0–0.1)
Basophils Relative: 1 %
Eosinophils Absolute: 0.2 10*3/uL (ref 0.0–0.5)
Eosinophils Relative: 2 %
HCT: 31.3 % — ABNORMAL LOW (ref 39.0–52.0)
Hemoglobin: 10.3 g/dL — ABNORMAL LOW (ref 13.0–17.0)
Immature Granulocytes: 3 %
Lymphocytes Relative: 26 %
Lymphs Abs: 2.2 10*3/uL (ref 0.7–4.0)
MCH: 27.4 pg (ref 26.0–34.0)
MCHC: 32.9 g/dL (ref 30.0–36.0)
MCV: 83.2 fL (ref 80.0–100.0)
Monocytes Absolute: 0.6 10*3/uL (ref 0.1–1.0)
Monocytes Relative: 7 %
Neutro Abs: 5.4 10*3/uL (ref 1.7–7.7)
Neutrophils Relative %: 61 %
Platelets: 484 10*3/uL — ABNORMAL HIGH (ref 150–400)
RBC: 3.76 MIL/uL — ABNORMAL LOW (ref 4.22–5.81)
RDW: 13.2 % (ref 11.5–15.5)
WBC: 8.6 10*3/uL (ref 4.0–10.5)
nRBC: 0 % (ref 0.0–0.2)

## 2022-01-28 LAB — BASIC METABOLIC PANEL
Anion gap: 7 (ref 5–15)
BUN: 25 mg/dL — ABNORMAL HIGH (ref 6–20)
CO2: 26 mmol/L (ref 22–32)
Calcium: 8.4 mg/dL — ABNORMAL LOW (ref 8.9–10.3)
Chloride: 102 mmol/L (ref 98–111)
Creatinine, Ser: 0.6 mg/dL — ABNORMAL LOW (ref 0.61–1.24)
GFR, Estimated: >60 mL/min (ref >60)
Glucose, Bld: 213 mg/dL — ABNORMAL HIGH (ref 70–99)
Potassium: 4.1 mmol/L (ref 3.5–5.1)
Sodium: 135 mmol/L (ref 135–145)

## 2022-01-28 LAB — MAGNESIUM: Magnesium: 1.6 mg/dL — ABNORMAL LOW (ref 1.7–2.4)

## 2022-01-28 LAB — C-REACTIVE PROTEIN: CRP: 2.1 mg/dL — ABNORMAL HIGH (ref <1.0)

## 2022-01-28 LAB — SEDIMENTATION RATE: Sed Rate: 62 mm/hr — ABNORMAL HIGH (ref 0–16)

## 2022-01-28 MED ORDER — MAGNESIUM SULFATE 2 GM/50ML IV SOLN
2.0000 g | Freq: Once | INTRAVENOUS | Status: AC
  Administered 2022-01-28: 2 g via INTRAVENOUS
  Filled 2022-01-28: qty 50

## 2022-01-28 NOTE — Progress Notes (Signed)
  Progress Note Patient: Tanner [name] KDX:[id number] DOB: [birth year] DOA: 01/17/2022  DOS: the patient was seen and examined on 01/28/2022  Brief hospital course: Past medical history of type II DM, HTN, polysubstance abuse with IVDA-crystal meth, tobacco abuse, hep C treated present to the hospital with complaints of back pain. Work-up shows septic arthritis/osteomyelitis of L4-5 facet as well as left erector spinae muscle abscess with MSSA bacteremia and tricuspid valve vegetation. ID consulted currently on IV antibiotics plan is to continue IV antibiotics for 2 to 3 weeks and then consider possibility of oral antibiotic regimen. Continues to have pain on the left hip area Assessment and Plan: MSSA bacteremia. IV drug abuse. Septic arthritis/osteomyelitis of L4-5 facet. Left erector spinae muscle abscess. Presents with back pain. Work-up showed above. Blood cultures positive for MSSA. TTE confirms possibility of tricuspid valve vegetation. No indication for TEE as the patient is going to be on antibiotic for prolonged duration regardless. Currently on IV cefazolin. Plan is to continue IV antibiotics for 2 to 3 weeks and then transition to p.o. antibiotics possibly. Discussed with ID, May require imaging of the left hip area if continues to have pain.  Pain control. Difficulty in a patient with history of polysubstance abuse. We will continue current regimen but change Robaxin to Flexeril and change from as needed to scheduled dose. Continue Toradol. Monitor.   Insomnia. Patient reports difficulty sleeping.  Will add melatonin to current regimen.   Opioid-induced constipation. Continue stool softener.   Diabetes mellitus, well controlled without any complication with long-term insulin use. Currently on sliding scale insulin as well as bolus regimen. Continue the same. Hemoglobin A1c 10.8 recently.   Hyponatremia, hypomagnesemia. Continue to being corrected.   Monitor.  Essential hypertension. Continuing Norvasc. Monitor.   Malnutrition ruled out. Appreciate dietary consultation. Body mass index is 23.02 kg/m. Nutrition Problem: Increased nutrient needs Etiology: acute illness (polysubstance abuse) Nutrition Interventions: Interventions: Ensure Enlive (each supplement provides 350kcal and 20 grams of protein), MVI, Liberalize Diet   Subjective: No nausea no vomiting.  Pain appears to be well controlled.  Able to sleep last night.  Physical Exam: Vitals:   01/27/22 1325 01/27/22 2136 01/28/22 0603 01/28/22 1424  BP: (!) 143/72 133/74 133/69 (!) 144/79  Pulse: 67 66 (!) 59 66  Resp: 17 18 18 18   Temp: 98.1 F (36.7 C) 98.4 F (36.9 C) 97.9 F (36.6 C) 98.2 F (36.8 C)  TempSrc: Oral Oral Oral Oral  SpO2: 98% 100% 98% 97%  Weight:      Height:       General: Appear in mild distress; no visible Abnormal Neck Mass Or lumps, Conjunctiva normal Cardiovascular: S1 and S2 Present, no Murmur, Respiratory: good respiratory effort, Bilateral Air entry present and CTA, no Crackles, no wheezes Abdomen: Bowel Sound present, Non tender  Extremities: no Pedal edema Neurology: alert and oriented to time, place, and person Gait not checked due to patient safety concerns   Data Reviewed: I have Reviewed nursing notes, Vitals, and Lab results since pt's last encounter. Pertinent lab results CBC and BMP I have ordered test including weekly CBC BMP and CRP I have discussed pt's care plan and test results with ID.   Family Communication: None at bedside  Disposition: Status is: Inpatient Remains inpatient appropriate because: Requiring IV antibiotic.  Unable to place a PICC line with history of IV drug abuse.  Author: , MD 01/28/2022 6:49 PM  Please look on [email or web address] to find out who is on call.

## 2022-01-28 NOTE — Plan of Care (Signed)
  Problem: Education: Goal: Ability to describe self-care measures that may prevent or decrease complications (Diabetes Survival Skills Education) will improve Outcome: Progressing Goal: Individualized Educational Video(s) Outcome: Progressing   Problem: Coping: Goal: Ability to adjust to condition or change in health will improve Outcome: Progressing   Problem: Fluid Volume: Goal: Ability to maintain a balanced intake and output will improve Outcome: Progressing   Problem: Health Behavior/Discharge Planning: Goal: Ability to identify and utilize available resources and services will improve Outcome: Progressing Goal: Ability to manage health-related needs will improve Outcome: Progressing   Problem: Metabolic: Goal: Ability to maintain appropriate glucose levels will improve Outcome: Progressing   Problem: Nutritional: Goal: Progress toward achieving an optimal weight will improve Outcome: Progressing   Problem: Skin Integrity: Goal: Risk for impaired skin integrity will decrease Outcome: Progressing   Problem: Tissue Perfusion: Goal: Adequacy of tissue perfusion will improve Outcome: Progressing   Problem: Education: Goal: Knowledge of General Education information will improve Description: Including pain rating scale, medication(s)/side effects and non-pharmacologic comfort measures Outcome: Progressing   Problem: Health Behavior/Discharge Planning: Goal: Ability to manage health-related needs will improve Outcome: Progressing   Problem: Clinical Measurements: Goal: Ability to maintain clinical measurements within normal limits will improve Outcome: Progressing Goal: Will remain free from infection Outcome: Progressing Goal: Diagnostic test results will improve Outcome: Progressing Goal: Respiratory complications will improve Outcome: Progressing Goal: Cardiovascular complication will be avoided Outcome: Progressing   Problem: Activity: Goal: Risk for  activity intolerance will decrease Outcome: Progressing   Problem: Coping: Goal: Level of anxiety will decrease Outcome: Progressing   Problem: Elimination: Goal: Will not experience complications related to bowel motility Outcome: Progressing   Problem: Pain Managment: Goal: General experience of comfort will improve Outcome: Progressing   Problem: Safety: Goal: Ability to remain free from injury will improve Outcome: Progressing   Problem: Skin Integrity: Goal: Risk for impaired skin integrity will decrease Outcome: Progressing

## 2022-01-29 LAB — CBC WITH DIFFERENTIAL/PLATELET
Abs Immature Granulocytes: 0.15 10*3/uL — ABNORMAL HIGH (ref 0.00–0.07)
Basophils Absolute: 0.1 10*3/uL (ref 0.0–0.1)
Basophils Relative: 1 %
Eosinophils Absolute: 0.2 10*3/uL (ref 0.0–0.5)
Eosinophils Relative: 2 %
HCT: 34.6 % — ABNORMAL LOW (ref 39.0–52.0)
Hemoglobin: 11.3 g/dL — ABNORMAL LOW (ref 13.0–17.0)
Immature Granulocytes: 2 %
Lymphocytes Relative: 24 %
Lymphs Abs: 2.2 10*3/uL (ref 0.7–4.0)
MCH: 27.3 pg (ref 26.0–34.0)
MCHC: 32.7 g/dL (ref 30.0–36.0)
MCV: 83.6 fL (ref 80.0–100.0)
Monocytes Absolute: 0.6 10*3/uL (ref 0.1–1.0)
Monocytes Relative: 6 %
Neutro Abs: 5.8 10*3/uL (ref 1.7–7.7)
Neutrophils Relative %: 65 %
Platelets: 514 10*3/uL — ABNORMAL HIGH (ref 150–400)
RBC: 4.14 MIL/uL — ABNORMAL LOW (ref 4.22–5.81)
RDW: 13.2 % (ref 11.5–15.5)
WBC: 8.9 10*3/uL (ref 4.0–10.5)
nRBC: 0 % (ref 0.0–0.2)

## 2022-01-29 LAB — GLUCOSE, CAPILLARY
Glucose-Capillary: 144 mg/dL — ABNORMAL HIGH (ref 70–99)
Glucose-Capillary: 179 mg/dL — ABNORMAL HIGH (ref 70–99)
Glucose-Capillary: 160 mg/dL — ABNORMAL HIGH (ref 70–99)
Glucose-Capillary: 236 mg/dL — ABNORMAL HIGH (ref 70–99)

## 2022-01-29 LAB — MAGNESIUM: Magnesium: 1.8 mg/dL (ref 1.7–2.4)

## 2022-01-29 MED ORDER — GABAPENTIN 300 MG PO CAPS
300.0000 mg | ORAL_CAPSULE | Freq: Three times a day (TID) | ORAL | Status: DC
  Administered 2022-01-29 – 2022-01-31 (×5): 300 mg via ORAL
  Filled 2022-01-29 (×5): qty 1

## 2022-01-29 MED ORDER — FAMOTIDINE 20 MG PO TABS
20.0000 mg | ORAL_TABLET | Freq: Every day | ORAL | Status: DC
  Administered 2022-01-29 – 2022-02-02 (×5): 20 mg via ORAL
  Filled 2022-01-29 (×5): qty 1

## 2022-01-29 MED ORDER — HYDROCODONE-ACETAMINOPHEN 5-325 MG PO TABS
1.0000 | ORAL_TABLET | Freq: Four times a day (QID) | ORAL | Status: DC | PRN
  Administered 2022-01-29 – 2022-01-31 (×3): 1 via ORAL
  Filled 2022-01-29 (×4): qty 1

## 2022-01-29 MED ORDER — GABAPENTIN 100 MG PO CAPS
200.0000 mg | ORAL_CAPSULE | Freq: Two times a day (BID) | ORAL | Status: DC
  Administered 2022-01-29: 200 mg via ORAL
  Filled 2022-01-29: qty 2

## 2022-01-29 MED ORDER — CELECOXIB 200 MG PO CAPS
200.0000 mg | ORAL_CAPSULE | Freq: Two times a day (BID) | ORAL | Status: DC
  Administered 2022-01-29 – 2022-02-19 (×43): 200 mg via ORAL
  Filled 2022-01-29 (×43): qty 1

## 2022-01-29 MED ORDER — GABAPENTIN 300 MG PO CAPS
300.0000 mg | ORAL_CAPSULE | Freq: Three times a day (TID) | ORAL | Status: DC
Start: 2022-01-29 — End: 2022-01-29

## 2022-01-29 NOTE — Progress Notes (Signed)
Progress Note Patient: Tanner [name] [id number]:[id number] DOB: Aug 06, 1966 DOA: 01/17/2022  DOS: the patient was seen and examined on 01/29/2022  Brief hospital course: Past medical history of type II DM, HTN, polysubstance abuse with IVDA-crystal meth, tobacco abuse, hep C treated present to the hospital with complaints of back pain. Work-up shows septic arthritis/osteomyelitis of L4-5 facet as well as left erector spinae muscle abscess with MSSA bacteremia and tricuspid valve vegetation. ID consulted currently on IV antibiotics plan is to continue IV antibiotics for 2 to 3 weeks and then consider possibility of oral antibiotic regimen. Continues to have pain on the left hip area Assessment and Plan: MSSA bacteremia. IV drug abuse. Septic arthritis/osteomyelitis of L4-5 facet. Left erector spinae muscle abscess. Possible L4-L5 epidural inflammation Presents with back pain. Blood cultures positive for MSSA. TTE confirms possibility of tricuspid valve vegetation. No indication for TEE as the patient is going to be on antibiotic for prolonged duration regardless. Currently on IV cefazolin. Plan is to continue IV antibiotics for 2 to 3 weeks and then transition to p.o. antibiotics possibly. Due to patient's ongoing complaint of the left hip pain we will perform MRI pelvis without contrast and MRI lumbar spine with and without contrast to assess progression of the known abscess as well as any other deep seeded infection.  Pain control. Difficulty in a patient with history of polysubstance abuse. Currently on Flexeril 10 mg 3 times daily and tramadol 50 mg every 6 hours as needed for moderate pain. I will add celecoxib 200 mg twice daily on a scheduled basis. And gabapentin 300 mg 3 times daily on scheduled basis. Also add Norco every 6 hours as needed for severe pain. Completed Toradol regimen for 5 days.  Insomnia. Patient reports difficulty sleeping.  Will add melatonin to current regimen.    Opioid-induced constipation. Continue stool softener.   Diabetes mellitus, well controlled without any complication with long-term insulin use. Currently on sliding scale insulin as well as bolus regimen. Continue the same. Hemoglobin A1c 10.8 recently. Patient have been educated multiple times with regards to his diet being carb modified in the setting of acute infection.   Hyponatremia, hypomagnesemia. Continue to being corrected.  Monitor.  Essential hypertension. Continuing Norvasc. Monitor.   Malnutrition ruled out. Appreciate dietary consultation. Body mass index is 23.02 kg/m.  Nutrition Problem: Increased nutrient needs Etiology: acute illness (polysubstance abuse) Interventions: Ensure Enlive (each supplement provides 350kcal and 20 grams of protein), MVI, Liberalize Diet   Subjective: Continues to report pain in the left hip area.  No nausea no vomiting.  No fever no chills.  Physical Exam: Vitals:   01/28/22 1424 01/28/22 2147 01/29/22 0446 01/29/22 1324  BP: (!) 144/79 139/77 136/75 (!) 160/82  Pulse: 66 70 71 67  Resp: 18 18 18 18   Temp: 98.2 F (36.8 C) 98.3 F (36.8 C) 98.3 F (36.8 C) 98.2 F (36.8 C)  TempSrc: Oral Oral Oral   SpO2: 97% 99% 99% 100%  Weight:      Height:       General: Appear in mild distress; no visible Abnormal Neck Mass Or lumps, Conjunctiva normal Cardiovascular: S1 and S2 Present, no Murmur, Respiratory: good respiratory effort, Bilateral Air entry present and CTA, no Crackles, no wheezes Abdomen: Bowel Sound present, Non tender  Extremities: no Pedal edema Neurology: alert and oriented to time, place, and person  Gait not checked due to patient safety concerns   Data Reviewed: I have Reviewed nursing notes, Vitals, and Lab results since  pt's last encounter. Pertinent lab results CBC  I have ordered test including MRI lumbar spine with and without contrast, MRI pelvis without contrast I have discussed pt's care plan and  test results with radiology and infectious disease.   Family Communication: None at bedside  Disposition: Status is: Inpatient Remains inpatient appropriate because: Needing IV antibiotics. Author: Lynden Oxford, MD 01/29/2022 5:51 PM  Please look on [email or web address] to find out who is on call.

## 2022-01-29 NOTE — Progress Notes (Signed)
Pt calling out repeatedly for multiple snacks throughout the day. Pt consumed 10 cups of ice cream overnight, including lots of food and drinks. Instructed pt to order all foods/supplements thru the dietary dept. Discussed diet w pt and multiple questions answered. Pt cursing and verbally aggressive, demands a Coke and other snacks or he will leave. Message sent to MD to request a diet change for pt - MD to call into pt's rm to discuss. Pt calmer after phone call, offered pt SF lemonade, water and coffee.

## 2022-01-29 NOTE — Progress Notes (Signed)
    [facility]   Reason for visit: follow up on facet arthritis and paraspinal abscess  Interval History: no rash or diarrhea.  Back pain on left side persists Day 12 cefazolin  Physical Exam: Constitutional:  Vitals:   01/28/22 2147 01/29/22 0446  BP: 139/77 136/75  Pulse: 70 71  Resp: 18 18  Temp: 98.3 F (36.8 C) 98.3 F (36.8 C)  SpO2: 99% 99%  He is in NAD Respiratory: normal respiratory effort Skin: no rashes  Review of Systems: Constitutional: negative for fever or chills  Lab Results  Component Value Date   WBC 8.9 01/29/2022   HGB 11.3 (L) 01/29/2022   HCT 34.6 (L) 01/29/2022   MCV 83.6 01/29/2022   PLT 514 (H) 01/29/2022    Lab Results  Component Value Date   CREATININE 0.60 (L) 01/28/2022   BUN 25 (H) 01/28/2022   NA 135 01/28/2022   K 4.1 01/28/2022   CL 102 01/28/2022   CO2 26 01/28/2022    Lab Results  Component Value Date   ALT 12 01/21/2022   AST 14 (L) 01/21/2022   ALKPHOS 105 01/21/2022     Microbiology: No results found for this or any previous visit (from the past 240 hour(s)).   Impression/Plan:  1. Facet arthritis with abscess - WBC wnl, remains afebrile and no new issues.  MSSA on blood cultures.  On cefazolin and will continue.   2.  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  Now day 12 of treatment.    3.  Persistent back pain - he continues to have pain on the left flank.  I agree with need to scan for ? Drainable Psoas abscess.    4.  Disposition - can consider another week of cefazolin followed by oritavancin with outpatient continuation with oral treatment.  No changes now and ID will follow up next week.

## 2022-01-30 LAB — GLUCOSE, CAPILLARY
Glucose-Capillary: 132 mg/dL — ABNORMAL HIGH (ref 70–99)
Glucose-Capillary: 164 mg/dL — ABNORMAL HIGH (ref 70–99)
Glucose-Capillary: 228 mg/dL — ABNORMAL HIGH (ref 70–99)
Glucose-Capillary: 132 mg/dL — ABNORMAL HIGH (ref 70–99)

## 2022-01-30 MED ORDER — GLUCERNA SHAKE PO LIQD
237.0000 mL | Freq: Three times a day (TID) | ORAL | Status: DC
  Administered 2022-01-30 – 2022-02-08 (×21): 237 mL via ORAL
  Filled 2022-01-30 (×29): qty 237

## 2022-01-30 NOTE — Progress Notes (Signed)
Progress Note Patient: Abdel Effinger OTL:[id number] DOB: [birth year] DOA: 01/17/2022  DOS: the patient was seen and examined on 01/30/2022  Brief hospital course: Past medical history of type II DM, HTN, polysubstance abuse with IVDA-crystal meth, tobacco abuse, hep C treated present to the hospital with complaints of back pain. Work-up shows septic arthritis/osteomyelitis of L4-5 facet as well as left erector spinae muscle abscess with MSSA bacteremia and tricuspid valve vegetation. ID consulted currently on IV antibiotics plan is to continue IV antibiotics for 2 to 3 weeks and then consider possibility of oral antibiotic regimen. Continues to have pain on the left hip area Assessment and Plan: MSSA bacteremia. IV drug abuse. Septic arthritis/osteomyelitis of L4-5 facet. Left erector spinae muscle abscess. Possible L4-L5 epidural inflammation Presents with back pain. Blood cultures positive for MSSA. TTE confirms possibility of tricuspid valve vegetation. No indication for TEE as the patient is going to be on antibiotic for prolonged duration regardless. Currently on IV cefazolin. Plan is to continue IV antibiotics for 2 to 3 weeks and then transition to p.o. antibiotics possibly. Due to patient's ongoing complaint of the left hip pain we will perform MRI pelvis without contrast and MRI lumbar spine with and without contrast to assess progression of the known abscess as well as any other deep seeded infection.  Pain control. Difficulty in a patient with history of polysubstance abuse. Currently on Flexeril 10 mg 3 times daily and tramadol 50 mg every 6 hours as needed for moderate pain. I will add celecoxib 200 mg twice daily on a scheduled basis. And gabapentin 300 mg 3 times daily on scheduled basis. Also add Norco every 6 hours as needed for severe pain. Completed Toradol regimen for 5 days.  Insomnia. Continue melatonin.   Opioid-induced constipation. Continue stool softener.    Diabetes mellitus, well controlled without any complication with long-term insulin use. Currently on sliding scale insulin as well as bolus regimen. Continue the same. Hemoglobin A1c 10.8 recently. Patient have been educated multiple times with regards to his diet being carb modified in the setting of acute infection.   Hyponatremia, hypomagnesemia. Continue to being corrected.  Monitor.  Essential hypertension. Continuing Norvasc. Monitor.   Malnutrition ruled out. Appreciate dietary consultation. Body mass index is 23.02 kg/m.  Nutrition Problem: Increased nutrient needs Etiology: acute illness (polysubstance abuse) Interventions: Ensure Enlive (each supplement provides 350kcal and 20 grams of protein), MVI, Liberalize Diet  Due to hyperglycemia as well as the risk for poor healing off infection I would continue carb modified diet but I will allow the patient on the higher portion food. Patient has been educated that regular soda so regular snacks as well as regular ice cream is going to be detrimental for his blood sugar control.  Subjective: No nausea no vomiting.  No fever no chills.  Continues to have pain although improving.  No diarrhea.  Physical Exam: Vitals:   01/29/22 1324 01/29/22 2151 01/30/22 0507 01/30/22 1343  BP: (!) 160/82 (!) 142/82 123/75 134/73  Pulse: 67 70 (!) 58 67  Resp: 18 15 18 18   Temp: 98.2 F (36.8 C) 98.6 F (37 C) 97.7 F (36.5 C) 98.6 F (37 C)  TempSrc:  Oral Oral Oral  SpO2: 100% 99% 98% 98%  Weight:      Height:       General: Appear in mild distress; no visible Abnormal Neck Mass Or lumps, Conjunctiva normal Cardiovascular: S1 and S2 Present, no Murmur, Respiratory: good respiratory effort, Bilateral Air entry present and  CTA, no Crackles, no wheezes Abdomen: Bowel Sound present, Non tender  Extremities: no Pedal edema, induration noted of the left gluteal region. Neurology: alert and oriented to time, place, and person  Gait not  checked due to patient safety concerns   Data Reviewed: I have Reviewed nursing notes, Vitals, and Lab results since pt's last encounter. Pertinent lab results hyperglycemia    Family Communication: None at bedside  Disposition: Status is: Inpatient Remains inpatient appropriate because: Requiring IV antibiotics  Author: Lynden Oxford, MD 01/30/2022 6:51 PM  Please look on [email or web address] to find out who is on call.

## 2022-01-31 ENCOUNTER — Inpatient Hospital Stay (HOSPITAL_COMMUNITY): Payer: Self-pay

## 2022-01-31 LAB — GLUCOSE, CAPILLARY
Glucose-Capillary: 113 mg/dL — ABNORMAL HIGH (ref 70–99)
Glucose-Capillary: 229 mg/dL — ABNORMAL HIGH (ref 70–99)
Glucose-Capillary: 206 mg/dL — ABNORMAL HIGH (ref 70–99)
Glucose-Capillary: 153 mg/dL — ABNORMAL HIGH (ref 70–99)

## 2022-01-31 MED ORDER — GADOBUTROL 1 MMOL/ML IV SOLN
8.0000 mL | Freq: Once | INTRAVENOUS | Status: AC | PRN
  Administered 2022-01-31: 8 mL via INTRAVENOUS

## 2022-01-31 MED ORDER — GABAPENTIN 400 MG PO CAPS
400.0000 mg | ORAL_CAPSULE | Freq: Three times a day (TID) | ORAL | Status: DC
  Administered 2022-01-31 – 2022-02-19 (×58): 400 mg via ORAL
  Filled 2022-01-31 (×58): qty 1

## 2022-01-31 MED ORDER — OXYCODONE-ACETAMINOPHEN 5-325 MG PO TABS
1.0000 | ORAL_TABLET | Freq: Three times a day (TID) | ORAL | Status: DC | PRN
  Administered 2022-01-31 – 2022-02-01 (×5): 1 via ORAL
  Filled 2022-01-31 (×5): qty 1

## 2022-01-31 NOTE — Discharge Instructions (Addendum)
Carbohydrate Counting For People With Diabetes  Foods with carbohydrates make your blood glucose level go up. Learning how to count carbohydrates can help you control your blood glucose levels. First, identify the foods you eat that contain carbohydrates. Then, using the Foods with Carbohydrates chart, determine about how much carbohydrates are in your meals and snacks. Make sure you are eating foods with fiber, protein, and healthy fat along with your carbohydrate foods. Foods with Carbohydrates The following table shows carbohydrate foods that have about 15 grams of carbohydrate each. Using measuring cups, spoons, or a food scale when you first begin learning about carbohydrate counting can help you learn about the portion sizes you typically eat. The following foods have 15 grams carbohydrate each:  Grains 1 slice bread (1 ounce)  1 small tortilla (6-inch size)   large bagel (1 ounce)  1/3 cup pasta or rice (cooked)   hamburger or hot dog bun ( ounce)   cup cooked cereal   to  cup ready-to-eat cereal  2 taco shells (5-inch size) Fruit 1 small fresh fruit ( to 1 cup)   medium banana  17 small grapes (3 ounces)  1 cup melon or berries   cup canned or frozen fruit  2 tablespoons dried fruit (blueberries, cherries, cranberries, raisins)   cup unsweetened fruit juice  Starchy Vegetables  cup cooked beans, peas, corn, potatoes/sweet potatoes   large baked potato (3 ounces)  1 cup acorn or butternut squash  Snack Foods 3 to 6 crackers  8 potato chips or 13 tortilla chips ( ounce to 1 ounce)  3 cups popped popcorn  Dairy 3/4 cup (6 ounces) nonfat plain yogurt, or yogurt with sugar-free sweetener  1 cup milk  1 cup plain rice, soy, coconut or flavored almond milk Sweets and Desserts  cup ice cream or frozen yogurt  1 tablespoon jam, jelly, pancake syrup, table sugar, or honey  2 tablespoons light pancake syrup  1 inch square of frosted cake or 2 inch square of unfrosted  cake  2 small cookies (2/3 ounce each) or  large cookie  Sometimes you'll have to estimate carbohydrate amounts if you don't know the exact recipe. One cup of mixed foods like soups can have 1 to 2 carbohydrate servings, while some casseroles might have 2 or more servings of carbohydrate. Foods that have less than 20 calories in each serving can be counted as "free" foods. Count 1 cup raw vegetables, or  cup cooked non-starchy vegetables as "free" foods. If you eat 3 or more servings at one meal, then count them as 1 carbohydrate serving.  Foods without Carbohydrates  Not all foods contain carbohydrates. Meat, some dairy, fats, non-starchy vegetables, and many beverages don't contain carbohydrate. So when you count carbohydrates, you can generally exclude chicken, pork, beef, fish, seafood, eggs, tofu, cheese, butter, sour cream, avocado, nuts, seeds, olives, mayonnaise, water, black coffee, unsweetened tea, and zero-calorie drinks. Vegetables with no or low carbohydrate include green beans, cauliflower, tomatoes, and onions. How much carbohydrate should I eat at each meal?  Carbohydrate counting can help you plan your meals and manage your weight. Following are some starting points for carbohydrate intake at each meal. Work with your registered dietitian nutritionist to find the best range that works for your blood glucose and weight.   To Lose Weight To Maintain Weight  Women 2 - 3 carb servings 3 - 4 carb servings  Men 3 - 4 carb servings 4 - 5 carb servings  Checking your   blood glucose after meals will help you know if you need to adjust the timing, type, or number of carbohydrate servings in your meal plan. Achieve and keep a healthy body weight by balancing your food intake and physical activity.  Tips How should I plan my meals?  Plan for half the food on your plate to include non-starchy vegetables, like salad greens, broccoli, or carrots. Try to eat 3 to 5 servings of non-starchy vegetables  every day. Have a protein food at each meal. Protein foods include chicken, fish, meat, eggs, or beans (note that beans contain carbohydrate). These two food groups (non-starchy vegetables and proteins) are low in carbohydrate. If you fill up your plate with these foods, you will eat less carbohydrate but still fill up your stomach. Try to limit your carbohydrate portion to  of the plate.  What fats are healthiest to eat?  Diabetes increases risk for heart disease. To help protect your heart, eat more healthy fats, such as olive oil, nuts, and avocado. Eat less saturated fats like butter, cream, and high-fat meats, like bacon and sausage. Avoid trans fats, which are in all foods that list "partially hydrogenated oil" as an ingredient. What should I drink?  Choose drinks that are not sweetened with sugar. The healthiest choices are water, carbonated or seltzer waters, and tea and coffee without added sugars.  Sweet drinks will make your blood glucose go up very quickly. One serving of soda or energy drink is  cup. It is best to drink these beverages only if your blood glucose is low.  Artificially sweetened, or diet drinks, typically do not increase your blood glucose if they have zero calories in them. Read labels of beverages, as some diet drinks do have carbohydrate and will raise your blood glucose. Label Reading Tips Read Nutrition Facts labels to find out how many grams of carbohydrate are in a food you want to eat. Don't forget: sometimes serving sizes on the label aren't the same as how much food you are going to eat, so you may need to calculate how much carbohydrate is in the food you are serving yourself.   Carbohydrate Counting for People with Diabetes Sample 1-Day Menu  Breakfast  cup yogurt, low fat, low sugar (1 carbohydrate serving)   cup cereal, ready-to-eat, unsweetened (1 carbohydrate serving)  1 cup strawberries (1 carbohydrate serving)   cup almonds ( carbohydrate serving)   Lunch 1, 5 ounce can chunk light tuna  2 ounces cheese, low fat cheddar  6 whole wheat crackers (1 carbohydrate serving)  1 small apple (1 carbohydrate servings)   cup carrots ( carbohydrate serving)   cup snap peas  1 cup 1% milk (1 carbohydrate serving)   Evening Meal Stir fry made with: 3 ounces chicken  1 cup brown rice (3 carbohydrate servings)   cup broccoli ( carbohydrate serving)   cup green beans   cup onions  1 tablespoon olive oil  2 tablespoons teriyaki sauce ( carbohydrate serving)  Evening Snack 1 extra small banana (1 carbohydrate serving)  1 tablespoon peanut butter   Carbohydrate Counting for People with Diabetes Vegan Sample 1-Day Menu  Breakfast 1 cup cooked oatmeal (2 carbohydrate servings)   cup blueberries (1 carbohydrate serving)  2 tablespoons flaxseeds  1 cup soymilk fortified with calcium and vitamin D  1 cup coffee  Lunch 2 slices whole wheat bread (2 carbohydrate servings)   cup baked tofu   cup lettuce  2 slices tomato  2 slices avocado     cup baby carrots ( carbohydrate serving)  1 orange (1 carbohydrate serving)  1 cup soymilk fortified with calcium and vitamin D   Evening Meal Burrito made with: 1 6-inch corn tortilla (1 carbohydrate serving)  1 cup refried vegetarian beans (2 carbohydrate servings)   cup chopped tomatoes   cup lettuce   cup salsa  1/3 cup brown rice (1 carbohydrate serving)  1 tablespoon olive oil for rice   cup zucchini   Evening Snack 6 small whole grain crackers (1 carbohydrate serving)  2 apricots ( carbohydrate serving)   cup unsalted peanuts ( carbohydrate serving)    Carbohydrate Counting for People with Diabetes Vegetarian (Lacto-Ovo) Sample 1-Day Menu  Breakfast 1 cup cooked oatmeal (2 carbohydrate servings)   cup blueberries (1 carbohydrate serving)  2 tablespoons flaxseeds  1 egg  1 cup 1% milk (1 carbohydrate serving)  1 cup coffee  Lunch 2 slices whole wheat bread (2 carbohydrate  servings)  2 ounces low-fat cheese   cup lettuce  2 slices tomato  2 slices avocado   cup baby carrots ( carbohydrate serving)  1 orange (1 carbohydrate serving)  1 cup unsweetened tea  Evening Meal Burrito made with: 1 6-inch corn tortilla (1 carbohydrate serving)   cup refried vegetarian beans (1 carbohydrate serving)   cup tomatoes   cup lettuce   cup salsa  1/3 cup brown rice (1 carbohydrate serving)  1 tablespoon olive oil for rice   cup zucchini  1 cup 1% milk (1 carbohydrate serving)  Evening Snack 6 small whole grain crackers (1 carbohydrate serving)  2 apricots ( carbohydrate serving)   cup unsalted peanuts ( carbohydrate serving)    Copyright 2020  Academy of [employer] and Dietetics. All rights reserved.  Using Nutrition Labels: Carbohydrate  Serving Size  Look at the serving size. All the information on the label is based on this portion. Servings Per Container  The number of servings contained in the package. Guidelines for Carbohydrate  Look at the total grams of carbohydrate in the serving size.  1 carbohydrate choice = 15 grams of carbohydrate. Range of Carbohydrate Grams Per Choice  Carbohydrate Grams/Choice Carbohydrate Choices  6-10   11-20 1  21-25 1  26-35 2  36-40 2  41-50 3  51-55 3  56-65 4  66-70 4  71-80 5    Copyright 2020  Academy of [employer] and Dietetics. All rights reserved.    Snacks with 0-2 grams of Carbohydrate Eggs (egg salad, boiled eggs, deviled eggs or scrambled eggs) Slices of grilled chicken  Cheese sticks (mozzarella, cheddar, provolone, swiss, [place], etc) Deli [place] and Orthoptist (2 slices) Tuna salad or chicken salad Dill pickles (2 spears) Sugar-Free Jello Water, diet soda, Crystal Light  Snacks with around 5 grams of Carbohydrate Lettuce (2 cups) with Ranch Dressing (1 tablespoon) Baby carrots, Bell Peppers, and/or Cucumber Slices (1 cup raw) with Ranch Dressing (2 tablespoons) Celery  (3 medium stalks) with Cream Cheese (2 tablespoons) Deli meat and Cheese Roll-ups (3) Black Olives (10-15 large olives) Engineer, water (1/2 cup) Beef or [place] jerky, cured without sugar (2 large pieces) Sliced avocado (1/2 cup)  Snacks with 5-10 grams of Carbohydrate  cup nuts or sunflower seeds 3 stalks celery with 2 tablespoons peanut butter

## 2022-01-31 NOTE — Progress Notes (Addendum)
NUTRITION NOTE RD working remotely.   Patient seen by a RD on 6/13 for full follow-up assessment. Consult received to review current diet order and suggestions for snacks with patient and to provide a list to nursing staff.  RD will see patient and discuss diet with patient and nursing staff in person on 6/19.  Patient is noted to currently be out of the room to MRI based on Current Location designation.  Patient on a Carb Modified diet with comment to allow extra portion sizes d/t calorie need of 2300-2500; this comment was entered yesterday afternoon.  Flow sheet documentation indicates patient ate 100% of breakfast and 100% of lunch today. CBGs today were 113 mg/dl at 1884 and 166 mg/dl at 0630.   Carbohydrate Counting for People with Diabetes handout from the Academy of Nutrition and Dietetics along with the following list of snack recommendations entered into AVS:  Snacks with 0-2 grams of Carbohydrate Eggs (egg salad, boiled eggs, deviled eggs or scrambled eggs) Slices of grilled chicken  Cheese sticks (mozzarella, cheddar, provolone, swiss, [place], etc) Deli [place] and Orthoptist (2 slices) Tuna salad or chicken salad Dill pickles (2 spears) Sugar-Free Jello Water, diet soda, Crystal Light  Snacks with around 5 grams of Carbohydrate Lettuce (2 cups) with Ranch Dressing (1 tablespoon) Baby carrots, Bell Peppers, and/or Cucumber Slices (1 cup raw) with Ranch Dressing (2 tablespoons) Celery (3 medium stalks) with Cream Cheese (2 tablespoons) Deli meat and Cheese Roll-ups (3) Black Olives (10-15 large olives) Engineer, water (1/2 cup) Beef or [place] jerky, cured without sugar (2 large pieces) Sliced avocado (1/2 cup)  Snacks with 5-10 grams of Carbohydrate  cup nuts or sunflower seeds 3 stalks celery with 2 tablespoons peanut butter        Trenton Gammon, MS, RD, LDN, CNSC Registered Dietitian II Inpatient Clinical Nutrition RD pager # and on-call/weekend pager  # available in AMION

## 2022-01-31 NOTE — Plan of Care (Signed)
  Problem: Coping: Goal: Level of anxiety will decrease Outcome: Progressing   Problem: Safety: Goal: Ability to remain free from injury will improve Outcome: Progressing   Problem: Pain Managment: Goal: General experience of comfort will improve Outcome: Progressing   

## 2022-01-31 NOTE — Progress Notes (Signed)
Progress Note Patient: Tanner [name] ONG:[id number] DOB: [birth year] DOA: 01/17/2022  DOS: the patient was seen and examined on 01/31/2022  Brief hospital course: Past medical history of type II DM, HTN, polysubstance abuse with IVDA-crystal meth, tobacco abuse, hep C treated present to the hospital with complaints of back pain. Work-up shows septic arthritis/osteomyelitis of L4-5 facet as well as left erector spinae muscle abscess with MSSA bacteremia and tricuspid valve vegetation. ID consulted currently on IV antibiotics plan is to continue IV antibiotics for 2 to 3 weeks and then consider possibility of oral antibiotic regimen. Continues to have pain on the left hip area Assessment and Plan: MSSA bacteremia. IV drug abuse. Septic arthritis/osteomyelitis of L4-5 facet. Left erector spinae muscle abscess. Possible L4-L5 epidural inflammation Presents with back pain. Blood cultures positive for MSSA. TTE confirms possibility of tricuspid valve vegetation. No indication for TEE as the patient is going to be on antibiotic for prolonged duration regardless. Currently on IV cefazolin. Plan is to continue IV antibiotics for 2 to 3 weeks and then transition to p.o. antibiotics possibly. Due to patient's ongoing complaint of the left hip pain we will perform MRI pelvis without contrast and MRI lumbar spine with and without contrast to assess progression of the known abscess as well as any other deep seeded infection.  Pain control. Difficulty in a patient with history of polysubstance abuse. Currently on Flexeril 10 mg 3 times daily and tramadol 50 mg every 6 hours as needed for moderate pain. I will add celecoxib 200 mg twice daily on a scheduled basis. And gabapentin 300 mg 3 times daily on scheduled basis. Also add Norco every 6 hours as needed for severe pain. Completed Toradol regimen for 5 days.  Insomnia. Continue melatonin.   Opioid-induced constipation. Continue stool softener.    Diabetes mellitus, well controlled without any complication with long-term insulin use. Currently on sliding scale insulin as well as bolus regimen. Continue the same. Hemoglobin A1c 10.8 recently. Patient have been educated multiple times with regards to his diet being carb modified in the setting of acute infection.   Hyponatremia, hypomagnesemia. Continue to being corrected.  Monitor.  Essential hypertension. Continuing Norvasc. Monitor.   Malnutrition ruled out. Appreciate dietary consultation. Body mass index is 23.02 kg/m.  Nutrition Problem: Increased nutrient needs Etiology: acute illness (polysubstance abuse) Interventions: Ensure Enlive (each supplement provides 350kcal and 20 grams of protein), MVI, Liberalize Diet  Due to hyperglycemia as well as the risk for poor healing off infection I would continue carb modified diet but I will allow the patient on the higher portion food. Patient has been educated that regular soda so regular snacks as well as regular ice cream is going to be detrimental for his blood sugar control.  Subjective: Continues to report pain.  No nausea no vomiting no fever no chills.  His food trays are inadequate for him.  Physical Exam: Vitals:   01/30/22 2057 01/31/22 0513 01/31/22 1317 01/31/22 2012  BP: (!) 141/87 127/73 124/80 132/72  Pulse: 77 (!) 59 72 70  Resp: 19 14  18   Temp: 98.4 F (36.9 C) 97.7 F (36.5 C) 98.6 F (37 C) 98.2 F (36.8 C)  TempSrc: Oral Oral Oral Oral  SpO2: 97% 98% 97% 98%  Weight:      Height:       General: Appear in mild distress; no visible Abnormal Neck Mass Or lumps, Conjunctiva normal Cardiovascular: S1 and S2 Present, no Murmur, Respiratory: good respiratory effort, Bilateral Air entry present  and CTA, no Crackles, no wheezes Abdomen: Bowel Sound present, Non tender Extremities: no Pedal edema Neurology: alert and oriented to time, place, and person  Gait not checked due to patient safety concerns    Data Reviewed: I have Reviewed nursing notes, Vitals, and Lab results since pt's last encounter. Pertinent lab results CBC and MRI pelvis and lumbar spine I have discussed pt's care plan and test results with radiology.   Family Communication: None at bedside  Disposition: Status is: Inpatient Remains inpatient appropriate because: Receiving IV antibiotics.  Author: Lynden Oxford, MD 01/31/2022 9:26 PM  Please look on [email or web address] to find out who is on call.

## 2022-02-01 DIAGNOSIS — M60009 Infective myositis, unspecified site: Secondary | ICD-10-CM

## 2022-02-01 LAB — GLUCOSE, CAPILLARY
Glucose-Capillary: 147 mg/dL — ABNORMAL HIGH (ref 70–99)
Glucose-Capillary: 122 mg/dL — ABNORMAL HIGH (ref 70–99)
Glucose-Capillary: 200 mg/dL — ABNORMAL HIGH (ref 70–99)
Glucose-Capillary: 283 mg/dL — ABNORMAL HIGH (ref 70–99)

## 2022-02-01 MED ORDER — OXYCODONE-ACETAMINOPHEN 5-325 MG PO TABS
1.0000 | ORAL_TABLET | Freq: Four times a day (QID) | ORAL | Status: DC | PRN
  Administered 2022-02-01: 1 via ORAL
  Administered 2022-02-02 – 2022-02-03 (×4): 2 via ORAL
  Filled 2022-02-01 (×3): qty 2
  Filled 2022-02-01: qty 1
  Filled 2022-02-01: qty 2

## 2022-02-01 NOTE — Progress Notes (Signed)
Subjective: No new complaints   Antibiotics:  Anti-infectives (From admission, onward)    Start     Dose/Rate Route Frequency Ordered Stop   01/18/22 0600  ceFAZolin (ANCEF) IVPB 2g/100 mL premix        2 g 200 mL/hr over 30 Minutes Intravenous Every 8 hours 01/18/22 0330     01/18/22 0200  vancomycin (VANCOREADY) IVPB 1250 mg/250 mL  Status:  Discontinued        1,250 mg 166.7 mL/hr over 90 Minutes Intravenous Every 12 hours 01/17/22 1741 01/18/22 0330   01/17/22 2200  ceFEPIme (MAXIPIME) 2 g in sodium chloride 0.9 % 100 mL IVPB  Status:  Discontinued        2 g 200 mL/hr over 30 Minutes Intravenous Every 8 hours 01/17/22 1645 01/18/22 0330   01/17/22 1400  vancomycin (VANCOREADY) IVPB 1500 mg/300 mL        1,500 mg 150 mL/hr over 120 Minutes Intravenous  Once 01/17/22 1323 01/17/22 1629   01/17/22 1330  ceFEPIme (MAXIPIME) 2 g in sodium chloride 0.9 % 100 mL IVPB        2 g 200 mL/hr over 30 Minutes Intravenous  Once 01/17/22 1323 01/17/22 1629       Medications: Scheduled Meds:  amLODipine  5 mg Oral Daily   celecoxib  200 mg Oral BID   cyclobenzaprine  10 mg Oral TID   famotidine  20 mg Oral Daily   feeding supplement (GLUCERNA SHAKE)  237 mL Oral TID BM   gabapentin  400 mg Oral TID   insulin aspart  0-15 Units Subcutaneous TID WC   insulin aspart  0-5 Units Subcutaneous QHS   insulin glargine-yfgn  12 Units Subcutaneous Daily   melatonin  5 mg Oral QHS   multivitamin with minerals  1 tablet Oral Daily   nicotine  21 mg Transdermal Daily   pneumococcal 20-valent conjugate vaccine  0.5 mL Intramuscular Tomorrow-1000   Ensure Max Protein  11 oz Oral BID   senna-docusate  1 tablet Oral BID   Continuous Infusions:   ceFAZolin (ANCEF) IV 2 g (02/01/22 1504)   PRN Meds:.acetaminophen **OR** acetaminophen, ALPRAZolam, ondansetron **OR** ondansetron (ZOFRAN) IV, oxyCODONE-acetaminophen, traMADol    Objective: Weight change:   Intake/Output Summary  (Last 24 hours) at 02/01/2022 1839 Last data filed at 02/01/2022 1800 Gross per 24 hour  Intake 2380 ml  Output 4300 ml  Net -1920 ml   Blood pressure 136/82, pulse 69, temperature 97.8 F (36.6 C), temperature source Oral, resp. rate 19, height 6' (1.829 m), weight 77 kg, SpO2 98 %. Temp:  [97.8 F (36.6 C)-98.2 F (36.8 C)] 97.8 F (36.6 C) (06/19 0558) Pulse Rate:  [69-70] 69 (06/19 0558) Resp:  [18-19] 19 (06/19 0558) BP: (132-136)/(72-82) 136/82 (06/19 0558) SpO2:  [98 %] 98 % (06/19 0558)  Physical Exam: Physical Exam Constitutional:      Appearance: He is well-developed.  HENT:     Head: Normocephalic and atraumatic.  Eyes:     Conjunctiva/sclera: Conjunctivae normal.  Cardiovascular:     Rate and Rhythm: Normal rate and regular rhythm.  Pulmonary:     Effort: Pulmonary effort is normal. No respiratory distress.     Breath sounds: No wheezing.  Abdominal:     General: There is no distension.     Palpations: Abdomen is soft.  Musculoskeletal:        General: Normal range of motion.     Cervical  back: Normal range of motion and neck supple.  Skin:    General: Skin is warm and dry.     Findings: No erythema or rash.  Neurological:     General: No focal deficit present.     Mental Status: He is alert and oriented to person, place, and time.  Psychiatric:        Mood and Affect: Mood normal.        Behavior: Behavior normal.        Thought Content: Thought content normal.        Judgment: Judgment normal.      CBC:    BMET No results for input(s): "NA", "K", "CL", "CO2", "GLUCOSE", "BUN", "CREATININE", "CALCIUM" in the last 72 hours.   Liver Panel  No results for input(s): "PROT", "ALBUMIN", "AST", "ALT", "ALKPHOS", "BILITOT", "BILIDIR", "IBILI" in the last 72 hours.     Sedimentation Rate No results for input(s): "ESRSEDRATE" in the last 72 hours. C-Reactive Protein No results for input(s): "CRP" in the last 72 hours.  Micro Results: Recent  Results (from the past 720 hour(s))  Blood culture (routine x 2)     Status: Abnormal   Collection Time: 01/17/22  1:40 PM   Specimen: BLOOD  Result Value Ref Range Status   Specimen Description   Final    BLOOD BLOOD LEFT FOREARM Performed at [facility] [address] [place], [place] [address]    Special Requests   Final    BOTTLES DRAWN AEROBIC AND ANAEROBIC Blood Culture adequate volume Performed at [facility] [address], [place], [place] [address]    Culture  Setup Time   Final    GRAM POSITIVE COCCI ANAEROBIC BOTTLE ONLY IN BOTH AEROBIC AND ANAEROBIC BOTTLES CRITICAL RESULT CALLED TO, READ BACK BY AND VERIFIED WITH: PHARMD MICHELLE LILLISTON 01/18/22@3 :11 BY TW Performed at [facility], [place] [address] [place], [place] [address]    Culture STAPHYLOCOCCUS AUREUS (A)  Final   Report Status 01/20/2022 FINAL  Final   Organism ID, Bacteria STAPHYLOCOCCUS AUREUS  Final      Susceptibility   Staphylococcus aureus - MIC*    CIPROFLOXACIN <=0.5 SENSITIVE Sensitive     ERYTHROMYCIN <=0.25 SENSITIVE Sensitive     GENTAMICIN <=0.5 SENSITIVE Sensitive     OXACILLIN <=0.25 SENSITIVE Sensitive     TETRACYCLINE <=1 SENSITIVE Sensitive     VANCOMYCIN 1 SENSITIVE Sensitive     TRIMETH/SULFA <=10 SENSITIVE Sensitive     CLINDAMYCIN <=0.25 SENSITIVE Sensitive     RIFAMPIN <=0.5 SENSITIVE Sensitive     Inducible Clindamycin NEGATIVE Sensitive     * STAPHYLOCOCCUS AUREUS  Blood Culture ID Panel (Reflexed)     Status: Abnormal   Collection Time: 01/17/22  1:40 PM  Result Value Ref Range Status   Enterococcus faecalis NOT DETECTED NOT DETECTED Final   Enterococcus Faecium NOT DETECTED NOT DETECTED Final   Listeria monocytogenes NOT DETECTED NOT DETECTED Final   Staphylococcus species DETECTED (A) NOT DETECTED Final    Comment: CRITICAL RESULT CALLED TO, READ BACK BY AND VERIFIED WITH: PHARMD MICHELLE LILLISTON 01/18/22@3 :11 BY TW     Staphylococcus aureus (BCID) DETECTED (A) NOT DETECTED Final    Comment: CRITICAL RESULT CALLED TO, READ BACK BY AND VERIFIED WITH: PHARMD MICHELLE LILLISTON 01/18/22@3 :11 BY TW    Staphylococcus epidermidis NOT DETECTED NOT DETECTED Final   Staphylococcus lugdunensis NOT DETECTED NOT DETECTED Final   Streptococcus species NOT DETECTED NOT DETECTED Final  Streptococcus agalactiae NOT DETECTED NOT DETECTED Final   Streptococcus pneumoniae NOT DETECTED NOT DETECTED Final   Streptococcus pyogenes NOT DETECTED NOT DETECTED Final   A.calcoaceticus-baumannii NOT DETECTED NOT DETECTED Final   Bacteroides fragilis NOT DETECTED NOT DETECTED Final   Enterobacterales NOT DETECTED NOT DETECTED Final   Enterobacter cloacae complex NOT DETECTED NOT DETECTED Final   Escherichia coli NOT DETECTED NOT DETECTED Final   Klebsiella aerogenes NOT DETECTED NOT DETECTED Final   Klebsiella oxytoca NOT DETECTED NOT DETECTED Final   Klebsiella pneumoniae NOT DETECTED NOT DETECTED Final   Proteus species NOT DETECTED NOT DETECTED Final   Salmonella species NOT DETECTED NOT DETECTED Final   Serratia marcescens NOT DETECTED NOT DETECTED Final   Haemophilus influenzae NOT DETECTED NOT DETECTED Final   Neisseria meningitidis NOT DETECTED NOT DETECTED Final   Pseudomonas aeruginosa NOT DETECTED NOT DETECTED Final   Stenotrophomonas maltophilia NOT DETECTED NOT DETECTED Final   Candida albicans NOT DETECTED NOT DETECTED Final   Candida auris NOT DETECTED NOT DETECTED Final   Candida glabrata NOT DETECTED NOT DETECTED Final   Candida krusei NOT DETECTED NOT DETECTED Final   Candida parapsilosis NOT DETECTED NOT DETECTED Final   Candida tropicalis NOT DETECTED NOT DETECTED Final   Cryptococcus neoformans/gattii NOT DETECTED NOT DETECTED Final   Meth resistant mecA/C and MREJ NOT DETECTED NOT DETECTED Final    Comment: Performed at [facility], [address] [place], [place] [address]  Blood culture (routine x  2)     Status: Abnormal   Collection Time: 01/17/22  1:50 PM   Specimen: BLOOD  Result Value Ref Range Status   Specimen Description   Final    BLOOD BLOOD RIGHT HAND Performed at [facility], [facility], [address], [place], [place] [address]    Special Requests   Final    BOTTLES DRAWN AEROBIC ONLY Blood Culture adequate volume Performed at [facility], [address] [place], [place] [address]    Culture  Setup Time   Final    GRAM POSITIVE COCCI AEROBIC BOTTLE ONLY CRITICAL VALUE NOTED.  VALUE IS CONSISTENT WITH PREVIOUSLY REPORTED AND CALLED VALUE.    Culture (A)  Final    STAPHYLOCOCCUS AUREUS SUSCEPTIBILITIES PERFORMED ON PREVIOUS CULTURE WITHIN THE LAST 5 DAYS. Performed at [facility], [address], [place], [place] [address]    Report Status 01/20/2022 FINAL  Final  Blood culture (routine x 2)     Status: Abnormal   Collection Time: 01/17/22  2:05 PM   Specimen: BLOOD  Result Value Ref Range Status   Specimen Description   Final    BLOOD RIGHT ANTECUBITAL Performed at [facility], [address] [place], [place] [address]    Special Requests   Final    BOTTLES DRAWN AEROBIC AND ANAEROBIC Blood Culture results may not be optimal due to an excessive volume of blood received in culture bottles Performed at [facility], [address] [place], [place] [address]    Culture  Setup Time   Final    GRAM POSITIVE COCCI AEROBIC BOTTLE ONLY CRITICAL VALUE NOTED.  VALUE IS CONSISTENT WITH PREVIOUSLY REPORTED AND CALLED VALUE. IN BOTH AEROBIC AND ANAEROBIC BOTTLES    Culture (A)  Final    STAPHYLOCOCCUS AUREUS SUSCEPTIBILITIES PERFORMED ON PREVIOUS CULTURE WITHIN THE LAST 5 DAYS. Performed at [facility], [address] [place], [place] [address]    Report Status 01/20/2022 FINAL  Final  Culture, blood (Routine X 2) w Reflex to ID Panel     Status: None   Collection Time: 01/19/22   9:40 AM   Specimen: BLOOD  Result Value Ref Range Status   Specimen Description   Final    BLOOD RIGHT ANTECUBITAL Performed at [facility] [address], [place], [place] [address]    Special Requests   Final    BOTTLES DRAWN AEROBIC AND ANAEROBIC Blood Culture adequate volume Performed at [facility] [address] [place], [place] [address]    Culture   Final    NO GROWTH 5 DAYS Performed at [facility], [place] [address], [place], [place] [address]    Report Status 01/24/2022 FINAL  Final  Culture, blood (Routine X 2) w Reflex to ID Panel     Status: None   Collection Time: 01/19/22  9:46 AM   Specimen: BLOOD  Result Value Ref Range Status   Specimen Description   Final    BLOOD BLOOD LEFT FOREARM Performed at [facility] [address] [place], [place] [address]    Special Requests   Final    BOTTLES DRAWN AEROBIC AND ANAEROBIC Blood Culture adequate volume Performed at [facility] [address], [place], [place] [address]    Culture   Final    NO GROWTH 5 DAYS Performed at [facility], [place] [address], [place], [place] [address]    Report Status 01/24/2022 FINAL  Final    Studies/Results: MR PELVIS WO CONTRAST  Result Date: 01/31/2022 CLINICAL DATA:  Osteomyelitis suspected, pelvis, xray done muskuloskeletal pelvis to look for deep infection. Left L4-5 facet septic arthritis and osteomyelitis EXAM: MRI PELVIS WITHOUT CONTRAST TECHNIQUE: Multiplanar multisequence MR imaging of the pelvis was performed. No intravenous contrast was administered. COMPARISON:  CT 08/21/2019.  MRI lumbar spine 01/31/2022 FINDINGS: Urinary Tract:  No abnormality visualized. Bowel:  Unremarkable visualized pelvic bowel loops. Vascular/Lymphatic: No pathologically enlarged lymph nodes. No significant vascular abnormality seen although examination is not tailored for evaluation of the vascular  structures. Reproductive:  No mass or other significant abnormality Other: Trace presacral free fluid, nonspecific. No organized intrapelvic fluid collection. Musculoskeletal: Septic arthritis of the left L4-5 facet joint with associated osteomyelitis, better seen on dedicated contrast enhance lumbar spine MRI performed same day. Extensive myositis within the posterior left paraspinal musculature adjacent to the left L4-5 facet joint and extending inferiorly to the level of the sacrum. Intramuscular fluid collection measures approximately 9.4 x 2.3 x 3.7 cm (series 12, image 5; series 10, image 14). No acute fracture. No dislocation. No femoral head avascular necrosis. No significant arthropathy of the bilateral hips. No hip joint effusion. Bony pelvis intact without diastasis. SI joints and pubic symphysis within normal limits. No SI joint effusion. No marrow replacing bone lesion. Tendinosis of the left hamstring tendon origin with reactive marrow edema in the left ischial tuberosity. Mild intramuscular edema within the bilateral gluteus medius and minimus muscles anteriorly (series 12, image 17), likely related to muscle strain. IMPRESSION: 1. Septic arthritis of the left L4-5 facet joint with associated osteomyelitis, better evaluated on dedicated contrast-enhanced lumbar spine MRI performed same day. 2. Extensive myositis within the posterior left paraspinal musculature adjacent to the left L4-5 facet joint and extending inferiorly to the level of the sacrum. Associated intramuscular abscess measures approximately 9.4 x 2.3 x 3.7 cm. 3. Elsewhere, no additional findings of active infection within the pelvis.  No additional sites of osteomyelitis or septic arthritis. 4. Mild intramuscular edema within the bilateral gluteus medius and minimus muscles anteriorly, likely related to muscle strain. 5. Tendinosis of the left hamstring tendon origin with reactive marrow edema in the left ischial tuberosity.  Electronically Signed   By: Davina Poke D.O.   On: 01/31/2022 13:55   MR Lumbar Spine W Wo Contrast  Result Date: 01/31/2022 CLINICAL DATA:  56 year old male with septic arthritis left L4-L5 facet, osteomyelitis. Subsequent encounter. EXAM: MRI LUMBAR SPINE WITHOUT AND WITH CONTRAST TECHNIQUE: Multiplanar and multiecho pulse sequences of the lumbar spine were obtained without and with intravenous contrast. CONTRAST:  25mL GADAVIST GADOBUTROL 1 MMOL/ML IV SOLN COMPARISON:  Lumbar MRI 01/17/2022. FINDINGS: Segmentation:  Normal, as numbered earlier this month. Alignment:  Stable lumbar lordosis. Vertebrae: Progressed marrow edema in the abnormal left L4-L5 facets and pedicles which are enhancing, compatible with on going osteomyelitis. Abnormal facet joint fluid persists. And serpiginous posterior paraspinal intramuscular abscess persists and now appears more unilocular although still serpiginous and irregular. See series 15 images 14 through 17. The abscess extends from the lower L3 level through the lower S1 level. Fluid volume estimated at 15 mL. Confluent regional muscle edema. L4 and L5 vertebral bodies, contralateral posterior elements, spinous processes, and the other lumbar levels remain spared. Visible sacrum and SI joints remain within normal limits. Conus medullaris and cauda equina: Conus extends to the L1 level. No lower spinal cord or conus signal abnormality. No abnormal intradural enhancement or dural thickening. Normal cauda equina nerve roots. Paraspinal and other soft tissues: Contralateral right paraspinal soft tissues remain normal. Negative visible abdominal viscera. Disc levels: Stable lower thoracic and lumbar intervertebral discs with no inflammation. Confluent inflammation in the left lateral epidural space of L4-L5, a involving the exiting left L4 and L5 nerves. The epidural inflammation extends to the left lateral recess of L5-S1 involving the descending left S1 nerve also. However,  there is no abscess in the epidural space. No lumbar spinal stenosis. IMPRESSION: 1. Progressed left L4 and L5 facet and pedicle Osteomyelitis from earlier this month, with underlying septic facet joint. 2. Confluent left lateral epidural inflammation now from the left L4 to the left S1 nerve levels. No epidural abscess. 3. But ongoing overlying Left Erector Spinae Intramuscular Abscess centered at L4-L5, estimated abscess volume 15 mm and tremendous regional paraspinal muscle and soft tissue inflammation. Electronically Signed   By: Genevie Ann M.D.   On: 01/31/2022 09:36      Assessment/Plan:  INTERVAL HISTORY: Repeat MRI has been performed and results reviewed   Principal Problem:   MSSA bacteremia Active Problems:   Tobacco abuse   Drug abuse ([facility])   Septic arthritis of lumbar spine ([facility])   Type 2 diabetes mellitus with hyperglycemia ([facility])   Hypocalcemia   Normocytic anemia   Hyponatremia   Moderate protein malnutrition ([facility])   Hypomagnesemia    Mikyle Hoskinson is a 56 y.o. male with history of injection drug use with MSSA bacteremia, tricuspid valve endocarditis and septic embolization to the lungs facet septic arthritis with abscess abscess in the erector spinae muscles.  He is now status post repeat MRI of the lumbar spine and pelvis.  There is evidence of progressive L4 and L5 facet pedicle osteomyelitis from earlier in the month with septic facet joint and confluent left lateral epidural inflammation from L4-S1 but without a discrete epidural abscess.  There is ongoing erector spine iron inflammatory myositis as well as an abscess at  L4-L5 with a volume of 15 cm.   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  Continue cefazolin  Would consult with interventional radiology regarding  feasibility of IR guided drainage of erector spinae abscess  #2 IVDU: This precludes conventional treatment of his endocarditis and facet septic arthritis vertebral osteomyelitis with prolonged IV antibiotics outside of the hospital.  Continue with IV antibiotics in the hospital while we sort out any other interventions that need to be made and then transition him likely to oral versus long-acting IV antibiotics.  #3  IV drug use: Need a long-term plan for this as it will continue to pose a risk of him having recurrent and bloodstream infections and other deep-seated infections.  I spent 37 minutes with the patient including than 50% of the time in face to face counseling of the patient regarding his metastatic MSSA infection personally reviewing MRI of the lumbar spine and pelvis updated culture data along with review of medical records in preparation for the visit and during the visit and in coordination of  his care.     LOS: 15 days   Alcide Evener 02/01/2022, 6:39 PM

## 2022-02-01 NOTE — Progress Notes (Signed)
Nutrition Note  RD consulted for nutrition education regarding diabetes.   Lab Results  Component Value Date   HGBA1C 10.8 (H) 01/18/2022    RD provided "Carbohydrate Counting for People with Diabetes" handout from the Academy of [employer] and Dietetics. Discussed different food groups and their effects on blood sugar, emphasizing carbohydrate-containing foods. Provided list of carbohydrates and recommended serving sizes of common foods.  Discussed importance of controlled and consistent carbohydrate intake throughout the day. Provided examples of ways to balance meals/snacks and encouraged intake of high-fiber, whole grain complex carbohydrates. Teach back method used.  Expect fair to good compliance. Pt very receptive to education and had many questions. Still reports not feeling full with meals. We reviewed appropriate snacks and portion sizes. Pt drinking his supplements as well. Pt likes the Ensure Max and can have this with his snacks but would not provide Glucerna shakes with snacks (contains more CHO).  Pt given handout to review.   Body mass index is 23.02 kg/m. Pt meets criteria for normal based on current BMI.  Current diet order is CHO modified, patient is consuming approximately 100% of meals at this time. Labs and medications reviewed. No further nutrition interventions warranted at this time. If additional nutrition issues arise, please re-consult RD.  Tilda Franco, MS, RD, LDN Inpatient Clinical Dietitian Contact information available via Amion

## 2022-02-01 NOTE — Progress Notes (Signed)
[facility] PROGRESS NOTE  Patient: Zen Cedillos YCX:[id number]   PCP: Patient, No Pcp Per DOB: 04-15-1966   DOA: 01/17/2022   DOS: 02/01/2022    Subjective: Pain well controlled no nausea vomiting or no fever no chills.  No chest pain abdominal pain.  No diarrhea.  Objective:  Vitals:   01/31/22 2012 02/01/22 0558  BP: 132/72 136/82  Pulse: 70 69  Resp: 18 19  Temp: 98.2 F (36.8 C) 97.8 F (36.6 C)  SpO2: 98% 98%    S1-S2 present Bowel sound present No edema in the lower extremity. No radiculopathy so far.  Assessment and plan: MSSA bacteremia. Erector spinae muscle abscess. OsteoMyelitis and septic arthritis of L4-L5 joint. ID following. Confirmed with radiology that the MRI does not show any worsening of the abscess. Given the size of the abscess will drain with IR to see if it is drainable or not for source control. Continue current pain regimen. Has only been on 2 courses of 5-day total regimens.  Author: Lynden Oxford, MD [facility] 02/01/2022 8:04 PM   If 7PM-7AM, please contact night-coverage at [email or web address]

## 2022-02-02 ENCOUNTER — Encounter (HOSPITAL_COMMUNITY): Payer: Self-pay | Admitting: Internal Medicine

## 2022-02-02 ENCOUNTER — Inpatient Hospital Stay (HOSPITAL_COMMUNITY): Payer: Self-pay

## 2022-02-02 DIAGNOSIS — K5900 Constipation, unspecified: Secondary | ICD-10-CM | POA: Diagnosis present

## 2022-02-02 DIAGNOSIS — M6088 Other myositis, other site: Secondary | ICD-10-CM

## 2022-02-02 DIAGNOSIS — F1990 Other psychoactive substance use, unspecified, uncomplicated: Secondary | ICD-10-CM | POA: Diagnosis present

## 2022-02-02 DIAGNOSIS — M6008 Infective myositis, other site: Secondary | ICD-10-CM | POA: Diagnosis present

## 2022-02-02 DIAGNOSIS — G8929 Other chronic pain: Secondary | ICD-10-CM | POA: Diagnosis present

## 2022-02-02 DIAGNOSIS — M4626 Osteomyelitis of vertebra, lumbar region: Secondary | ICD-10-CM | POA: Diagnosis present

## 2022-02-02 DIAGNOSIS — I079 Rheumatic tricuspid valve disease, unspecified: Secondary | ICD-10-CM

## 2022-02-02 DIAGNOSIS — I330 Acute and subacute infective endocarditis: Secondary | ICD-10-CM | POA: Diagnosis present

## 2022-02-02 DIAGNOSIS — G4700 Insomnia, unspecified: Secondary | ICD-10-CM | POA: Diagnosis present

## 2022-02-02 DIAGNOSIS — M0088 Arthritis due to other bacteria, vertebrae: Secondary | ICD-10-CM

## 2022-02-02 DIAGNOSIS — G062 Extradural and subdural abscess, unspecified: Secondary | ICD-10-CM

## 2022-02-02 LAB — GLUCOSE, CAPILLARY
Glucose-Capillary: 184 mg/dL — ABNORMAL HIGH (ref 70–99)
Glucose-Capillary: 142 mg/dL — ABNORMAL HIGH (ref 70–99)
Glucose-Capillary: 140 mg/dL — ABNORMAL HIGH (ref 70–99)
Glucose-Capillary: 126 mg/dL — ABNORMAL HIGH (ref 70–99)

## 2022-02-02 MED ORDER — FENTANYL CITRATE (PF) 100 MCG/2ML IJ SOLN
INTRAMUSCULAR | Status: AC | PRN
  Administered 2022-02-02: 50 ug via INTRAVENOUS

## 2022-02-02 MED ORDER — KETOROLAC TROMETHAMINE 15 MG/ML IJ SOLN: 15.0000 mg | Freq: Four times a day (QID) | INTRAMUSCULAR | Status: DC | PRN

## 2022-02-02 MED ORDER — MIDAZOLAM HCL 2 MG/2ML IJ SOLN
INTRAMUSCULAR | Status: AC | PRN
  Administered 2022-02-02: 2 mg via INTRAVENOUS

## 2022-02-02 MED ORDER — DIPHENHYDRAMINE HCL 50 MG/ML IJ SOLN
INTRAMUSCULAR | Status: AC | PRN
  Administered 2022-02-02: 50 mg via INTRAVENOUS

## 2022-02-02 MED ORDER — MIDAZOLAM HCL 2 MG/2ML IJ SOLN
INTRAMUSCULAR | Status: AC
  Filled 2022-02-02: qty 4

## 2022-02-02 MED ORDER — KETOROLAC TROMETHAMINE 15 MG/ML IJ SOLN
15.0000 mg | Freq: Three times a day (TID) | INTRAMUSCULAR | Status: AC
  Administered 2022-02-02 – 2022-02-03 (×3): 15 mg via INTRAVENOUS
  Filled 2022-02-02 (×3): qty 1

## 2022-02-02 MED ORDER — PANTOPRAZOLE SODIUM 40 MG PO TBEC
40.0000 mg | DELAYED_RELEASE_TABLET | Freq: Every day | ORAL | Status: DC
  Administered 2022-02-02 – 2022-02-19 (×16): 40 mg via ORAL
  Filled 2022-02-02 (×19): qty 1

## 2022-02-02 MED ORDER — DIPHENHYDRAMINE HCL 50 MG/ML IJ SOLN
INTRAMUSCULAR | Status: AC
  Filled 2022-02-02: qty 1

## 2022-02-02 MED ORDER — ENOXAPARIN SODIUM 40 MG/0.4ML IJ SOSY
40.0000 mg | PREFILLED_SYRINGE | INTRAMUSCULAR | Status: DC
Start: 2022-02-03 — End: 2022-02-18
  Administered 2022-02-03 – 2022-02-18 (×16): 40 mg via SUBCUTANEOUS
  Filled 2022-02-02 (×16): qty 0.4

## 2022-02-02 MED ORDER — FENTANYL CITRATE (PF) 100 MCG/2ML IJ SOLN
INTRAMUSCULAR | Status: AC
  Filled 2022-02-02: qty 4

## 2022-02-02 MED ORDER — MIDAZOLAM HCL 2 MG/2ML IJ SOLN
INTRAMUSCULAR | Status: AC | PRN
  Administered 2022-02-02: 1 mg via INTRAVENOUS

## 2022-02-02 MED ORDER — LIDOCAINE HCL 1 % IJ SOLN
INTRAMUSCULAR | Status: AC
  Administered 2022-02-02: 10 mL
  Filled 2022-02-02: qty 20

## 2022-02-02 NOTE — Procedures (Signed)
Interventional Radiology Procedure:   Indications: Paraspinal abscess  Procedure: US guided paraspinal abscess aspiration  Findings: Complex left paraspinal abscess in lower lumbar region.  Aspirated 6 ml of brownish-yellow fluid.    Complications: No immediate complications noted.     EBL: Minimal  Plan: Send fluid for culture.     Eran Windish R. Lowella Dandy, MD  Pager: [phone]

## 2022-02-02 NOTE — Progress Notes (Signed)
Progress Note Patient: Tanner [name] URK:[id number] DOB: March 10, 1966 DOA: 01/17/2022  DOS: the patient was seen and examined on 02/02/2022  Brief hospital course: Past medical history of type II DM, HTN, polysubstance abuse with IVDA-crystal meth, tobacco abuse, hep C treated present to the hospital with complaints of back pain. Work-up shows septic arthritis/osteomyelitis of L4-5 facet as well as left erector spinae muscle abscess with MSSA bacteremia and tricuspid valve vegetation. ID consulted currently on IV antibiotics plan is to continue IV antibiotics for 2 to 3 weeks and then consider possibility of oral antibiotic regimen. Underwent ultrasound-guided drainage of the left erector spinae abscess on 6/20. Continues to have pain on the left hip area  Assessment and Plan:   MSSA bacteremia   Septic arthritis of lumbar spine (HCC)   IVDU (intravenous drug user)   Osteomyelitis of lumbar spine (HCC)   Left erector spinae muscle abscess   Infective endocarditis of tricuspid valve. Presents with progressively worsening back pain for 2 weeks.  Due to uncontrolled pain MRI performed in ER.  Which shows evidence of L4-L5 septic arthritis as well as osteomyelitis and erector spinae muscle abscess on the left. MRI T-spine without any acute finding. Neurosurgery was initially consulted recommend conservative measures with IV antibiotics without any intervention. Blood cultures came back positive for MSSA bacteremia. ID was consulted. Echocardiogram positive for tricuspid valve vegetation.  TEE was not indicated.  Repeat blood cultures positive negative. Currently on IV cefazolin.  Plan is to continue IV antibiotics for protracted course.  We will follow ID recommendation. Repeat MRI on 6/18 shows mild progression of the localized inflammation. S/P ultrasound-guided drainage of the erector spinae muscle abscess on 6/20.  Cultures sent.  Follow-up. Patient will require repeat MRI lumbar spine in 2 to  3 weeks to ensure that the epidural inflammation is improving.    Chronic pain Currently on scheduled Flexeril, tramadol as needed.  Also on celecoxib and gabapentin. Also on as needed Percocet. Patient has completed 2 rounds of IV Toradol.  Due to postprocedure status I will give him 3 doses of IV Toradol on 6/20.    Insomnia Continue current regimen.    Controlled type 2 diabetes mellitus without complication, without long-term current use of insulin (HCC) Hemoglobin A1c 10.8 recently. Currently on sliding scale insulin and bolus regimen. Continue with carb modified diet.  Allowing patient to have extra portions.  Educated patient to stay away from snacks with simple carbs.    Essential hypertension Blood pressure stable. Continue current regimen.    Constipation Treated with bowel regimen. Continue.    Hypocalcemia   Hyponatremia   Hypomagnesemia Treated. Monitor.    Tobacco abuse Patient was counseled to quit.    Normocytic anemia Hemoglobin stable. Monitor.  Subjective: Continues to have back pain.  No nausea no vomiting.  Pain actually worsened after the procedure.  No fever no chills.  Physical Exam: Vitals:   02/02/22 1533 02/02/22 1630 02/02/22 1730 02/02/22 1830  BP: 119/69 114/69 125/70 138/75  Pulse: 81 77 72 82  Resp: 15 15 15 16   Temp: 97.9 F (36.6 C) 98.7 F (37.1 C) 97.9 F (36.6 C) 98 F (36.7 C)  TempSrc: Oral Oral Oral Oral  SpO2: 96% 98% 99% 97%  Weight:      Height:       General: Appear in moderate distress; no visible Abnormal Neck Mass Or lumps, Conjunctiva normal Cardiovascular: S1 and S2 Present, no Murmur, Respiratory: good respiratory effort, Bilateral Air entry present and  CTA, no Crackles, no wheezes Abdomen: Bowel Sound present, Non tender  Extremities: no Pedal edema Neurology: alert and oriented to time, place, and person  Gait not checked due to patient safety concerns   Data Reviewed: I have Reviewed nursing notes,  Vitals, and Lab results since pt's last encounter. Pertinent lab results elevated CBG I have ordered test including ultrasound-guided abscess drainage I have discussed pt's care plan and test results with interventional radiology.   Family Communication: None at bedside  Disposition: Status is: Inpatient Remains inpatient appropriate because: Needing IV antibiotics.  IVDU.  Author: Lynden Oxford, MD 02/02/2022 7:47 PM  Please look on [email or web address] to find out who is on call.

## 2022-02-02 NOTE — H&P (Signed)
Chief Complaint: Patient was seen in consultation today for intramuscular abscess at the request of Lynden Oxford, MD  Referring Physician(s): Lynden Oxford, MD  Supervising Physician: Richarda Overlie  Patient Status: [facility] - In-pt  History of Present Illness: Tanner [name] is a 56 y.o. male with past medical history DM type II, HTN, polysubstance abuse, tobacco abuse, hep C and HTN.  Patient presented to ED twice in 1 week complaining of progressively worsening lower back pain for 2 weeks.  MRI spine confirmed L4-L5 facet septic arthritis with associated osteomyelitis and multiloculated left erector spinae muscle abscess.  Patient was referred to IR by Lynden Oxford, MD for aspiration of abscess. Procedure was approved by Dr. Lowella Dandy.   MR Lumbar spine 01/31/22: IMPRESSION: 1. Progressed left L4 and L5 facet and pedicle Osteomyelitis from earlier this month, with underlying septic facet joint. 2. Confluent left lateral epidural inflammation now from the left L4 to the left S1 nerve levels. No epidural abscess. 3. But ongoing overlying Left Erector Spinae Intramuscular Abscess centered at L4-L5, estimated abscess volume 15 mm and tremendous regional paraspinal muscle and soft tissue inflammation.  Past Medical History:  Diagnosis Date   Cellulitis of right foot 05/17/2019   Diabetes mellitus without complication (HCC)    Drug abuse (HCC)    Essential hypertension 09/20/2017   Formatting of this note might be different from the original. Stable with current therapy; continue  Last Assessment & Plan:  Formatting of this note might be different from the original.   Hepatitis C antibody positive in blood 10/05/2017   Formatting of this note might be different from the original. 10/05/17: ab +, RNA neg on 2/12 labs. Likely past infection that is resolved. F/u prn.   History of vitamin D deficiency 09/20/2017   Formatting of this note might be different from the original. 09/20/17: per pt; labs 1 wk;  supplement prn.   Opioid dependence, uncomplicated (HCC) 02/24/2018   Formatting of this note might be different from the original. Self-medicating with buprenorphine Formatting of this note might be different from the original. Self-medicating with buprenorphine   Periodontal disease 10/11/2017   Formatting of this note might be different from the original. 10/11/17: refer to dental   Tobacco abuse    Type 2 diabetes mellitus with hyperglycemia (HCC) 01/17/2022    Past Surgical History:  Procedure Laterality Date   SKIN GRAFT      Allergies: Trazodone and nefazodone  Medications: Prior to Admission medications   Medication Sig Start Date End Date Taking? Authorizing Provider  cyclobenzaprine (FLEXERIL) 10 MG tablet Take 1 tablet (10 mg total) by mouth 2 (two) times daily as needed for muscle spasms. Patient not taking: Reported on 01/17/2022 01/11/22   Tegeler, Canary Brim, MD  HYDROcodone-acetaminophen (NORCO/VICODIN) 5-325 MG tablet Take 2 tablets by mouth every 4 (four) hours as needed. Patient not taking: Reported on 01/17/2022 10/21/21   Roemhildt, Lorin T, PA-C  lidocaine (LIDODERM) 5 % Place 1 patch onto the skin daily. Remove & Discard patch within 12 hours or as directed by MD Patient not taking: Reported on 01/17/2022 01/11/22   Tegeler, Canary Brim, MD  metFORMIN (GLUCOPHAGE) 1000 MG tablet Take 1 tablet (1,000 mg total) by mouth 2 (two) times daily. Patient not taking: Reported on 01/17/2022 08/22/19 09/23/28  Nira Conn, MD  naloxone [facility]) 0.4 MG/ML injection Use per directions Patient not taking: Reported on 01/17/2022 09/24/19   Mesner, Barbara Cower, MD     Family History  Problem Relation  Age of Onset   Dementia Father    Diabetes Mellitus II Brother     Social History   Socioeconomic History   Marital status: Legally Separated    Spouse name: Not on file   Number of children: Not on file   Years of education: Not on file   Highest education level: Not on file   Occupational History   Not on file  Tobacco Use   Smoking status: Every Day    Types: Cigarettes   Smokeless tobacco: Never  Vaping Use   Vaping Use: Some days  Substance and Sexual Activity   Alcohol use: Never   Drug use: Yes    Types: IV, Methamphetamines    Comment: heroin   Sexual activity: Not on file  Other Topics Concern   Not on file  Social History Narrative   Not on file   Social Determinants of Health   Financial Resource Strain: Not on file  Food Insecurity: Not on file  Transportation Needs: Not on file  Physical Activity: Not on file  Stress: Not on file  Social Connections: Not on file    Review of Systems: A 12 point ROS discussed and pertinent positives are indicated in the HPI above.  All other systems are negative.  Review of Systems  Constitutional:  Positive for fatigue. Negative for appetite change, chills and fever.  Respiratory:  Positive for shortness of breath. Negative for cough.   Cardiovascular:  Negative for chest pain.  Gastrointestinal:  Negative for abdominal pain, nausea and vomiting.  Musculoskeletal:  Positive for back pain.  Neurological:  Positive for weakness. Negative for dizziness and headaches.    Vital Signs: BP 110/68 (BP Location: Left Arm)   Pulse 62   Temp (!) 97.5 F (36.4 C) (Oral)   Resp 14   Ht 6' (1.829 m)   Wt 169 lb 12.1 oz (77 kg)   SpO2 97%   BMI 23.02 kg/m     Physical Exam Vitals reviewed.  Constitutional:      General: He is not in acute distress.    Appearance: Normal appearance. He is not ill-appearing.  HENT:     Head: Normocephalic and atraumatic.     Mouth/Throat:     Mouth: Mucous membranes are moist.     Pharynx: Oropharynx is clear.  Eyes:     Extraocular Movements: Extraocular movements intact.     Pupils: Pupils are equal, round, and reactive to light.  Cardiovascular:     Rate and Rhythm: Normal rate and regular rhythm.     Pulses: Normal pulses.     Heart sounds: Normal  heart sounds.  Pulmonary:     Effort: Pulmonary effort is normal. No respiratory distress.     Breath sounds: Normal breath sounds.  Abdominal:     General: Bowel sounds are normal. There is no distension.     Palpations: Abdomen is soft.     Tenderness: There is no abdominal tenderness. There is no guarding.  Musculoskeletal:     Right lower leg: No edema.     Left lower leg: No edema.  Skin:    General: Skin is warm and dry.  Neurological:     Mental Status: He is alert and oriented to person, place, and time.  Psychiatric:        Mood and Affect: Mood normal.        Behavior: Behavior normal.        Thought Content: Thought content normal.  Judgment: Judgment normal.     Imaging: MR PELVIS WO CONTRAST  Result Date: 01/31/2022 CLINICAL DATA:  Osteomyelitis suspected, pelvis, xray done muskuloskeletal pelvis to look for deep infection. Left L4-5 facet septic arthritis and osteomyelitis EXAM: MRI PELVIS WITHOUT CONTRAST TECHNIQUE: Multiplanar multisequence MR imaging of the pelvis was performed. No intravenous contrast was administered. COMPARISON:  CT 08/21/2019.  MRI lumbar spine 01/31/2022 FINDINGS: Urinary Tract:  No abnormality visualized. Bowel:  Unremarkable visualized pelvic bowel loops. Vascular/Lymphatic: No pathologically enlarged lymph nodes. No significant vascular abnormality seen although examination is not tailored for evaluation of the vascular structures. Reproductive:  No mass or other significant abnormality Other: Trace presacral free fluid, nonspecific. No organized intrapelvic fluid collection. Musculoskeletal: Septic arthritis of the left L4-5 facet joint with associated osteomyelitis, better seen on dedicated contrast enhance lumbar spine MRI performed same day. Extensive myositis within the posterior left paraspinal musculature adjacent to the left L4-5 facet joint and extending inferiorly to the level of the sacrum. Intramuscular fluid collection measures  approximately 9.4 x 2.3 x 3.7 cm (series 12, image 5; series 10, image 14). No acute fracture. No dislocation. No femoral head avascular necrosis. No significant arthropathy of the bilateral hips. No hip joint effusion. Bony pelvis intact without diastasis. SI joints and pubic symphysis within normal limits. No SI joint effusion. No marrow replacing bone lesion. Tendinosis of the left hamstring tendon origin with reactive marrow edema in the left ischial tuberosity. Mild intramuscular edema within the bilateral gluteus medius and minimus muscles anteriorly (series 12, image 17), likely related to muscle strain. IMPRESSION: 1. Septic arthritis of the left L4-5 facet joint with associated osteomyelitis, better evaluated on dedicated contrast-enhanced lumbar spine MRI performed same day. 2. Extensive myositis within the posterior left paraspinal musculature adjacent to the left L4-5 facet joint and extending inferiorly to the level of the sacrum. Associated intramuscular abscess measures approximately 9.4 x 2.3 x 3.7 cm. 3. Elsewhere, no additional findings of active infection within the pelvis. No additional sites of osteomyelitis or septic arthritis. 4. Mild intramuscular edema within the bilateral gluteus medius and minimus muscles anteriorly, likely related to muscle strain. 5. Tendinosis of the left hamstring tendon origin with reactive marrow edema in the left ischial tuberosity. Electronically Signed   By: Duanne GuessNicholas  Plundo D.O.   On: 01/31/2022 13:55   MR Lumbar Spine W Wo Contrast  Result Date: 01/31/2022 CLINICAL DATA:  56 year old male with septic arthritis left L4-L5 facet, osteomyelitis. Subsequent encounter. EXAM: MRI LUMBAR SPINE WITHOUT AND WITH CONTRAST TECHNIQUE: Multiplanar and multiecho pulse sequences of the lumbar spine were obtained without and with intravenous contrast. CONTRAST:  8mL GADAVIST GADOBUTROL 1 MMOL/ML IV SOLN COMPARISON:  Lumbar MRI 01/17/2022. FINDINGS: Segmentation:  Normal, as  numbered earlier this month. Alignment:  Stable lumbar lordosis. Vertebrae: Progressed marrow edema in the abnormal left L4-L5 facets and pedicles which are enhancing, compatible with on going osteomyelitis. Abnormal facet joint fluid persists. And serpiginous posterior paraspinal intramuscular abscess persists and now appears more unilocular although still serpiginous and irregular. See series 15 images 14 through 17. The abscess extends from the lower L3 level through the lower S1 level. Fluid volume estimated at 15 mL. Confluent regional muscle edema. L4 and L5 vertebral bodies, contralateral posterior elements, spinous processes, and the other lumbar levels remain spared. Visible sacrum and SI joints remain within normal limits. Conus medullaris and cauda equina: Conus extends to the L1 level. No lower spinal cord or conus signal abnormality. No abnormal intradural enhancement or  dural thickening. Normal cauda equina nerve roots. Paraspinal and other soft tissues: Contralateral right paraspinal soft tissues remain normal. Negative visible abdominal viscera. Disc levels: Stable lower thoracic and lumbar intervertebral discs with no inflammation. Confluent inflammation in the left lateral epidural space of L4-L5, a involving the exiting left L4 and L5 nerves. The epidural inflammation extends to the left lateral recess of L5-S1 involving the descending left S1 nerve also. However, there is no abscess in the epidural space. No lumbar spinal stenosis. IMPRESSION: 1. Progressed left L4 and L5 facet and pedicle Osteomyelitis from earlier this month, with underlying septic facet joint. 2. Confluent left lateral epidural inflammation now from the left L4 to the left S1 nerve levels. No epidural abscess. 3. But ongoing overlying Left Erector Spinae Intramuscular Abscess centered at L4-L5, estimated abscess volume 15 mm and tremendous regional paraspinal muscle and soft tissue inflammation. Electronically Signed   By: Odessa Fleming M.D.   On: 01/31/2022 09:36   ECHOCARDIOGRAM COMPLETE  Result Date: 01/18/2022    ECHOCARDIOGRAM REPORT   Patient Name:   REGINALDO HAZARD Date of Exam: 01/18/2022 Medical Rec #:  [id number]     Height:       72.0 in Accession #:    [id number]    Weight:       169.8 lb Date of Birth:  [birth year]     BSA:          1.987 m Patient Age:    55 years      BP:           154/80 mmHg Patient Gender: M             HR:           58 bpm. Exam Location:  Inpatient Procedure: 2D Echo, 3D Echo, Cardiac Doppler and Color Doppler Indications:    Bacteremia  History:        Patient has no prior history of Echocardiogram examinations.                 Risk Factors:Hypertension, Current Smoker and Diabetes.                 Polysubstance abuse.  Sonographer:    Sheralyn Boatman RDCS Referring Phys: [id number] ANAND D HONGALGI IMPRESSIONS  1. Left ventricular ejection fraction by 3D volume is 55 %. The left ventricle has normal function. The left ventricle has no regional wall motion abnormalities. Left ventricular diastolic parameters were normal.  2. Right ventricular systolic function is normal. The right ventricular size is normal.  3. The mitral valve is normal in structure. Trivial mitral valve regurgitation. No evidence of mitral stenosis.  4. There is a highly mobile filament on the tricupid valve that is not well visualized, given the clinical picture with bacteremia recommend TEE for clarification.  5. The aortic valve is normal in structure. Aortic valve regurgitation is not visualized. No aortic stenosis is present.  6. The inferior vena cava is normal in size with greater than 50% respiratory variability, suggesting right atrial pressure of 3 mmHg. Conclusion(s)/Recommendation(s): Findings concerning for possible vegetation, would recommend Transesophageal Echocardiogram for clarification. FINDINGS  Left Ventricle: Left ventricular ejection fraction by 3D volume is 55 %. The left ventricle has normal function. The left ventricle has no  regional wall motion abnormalities. The left ventricular internal cavity size was normal in size. There is no left  ventricular hypertrophy. Left ventricular diastolic parameters were normal. Right Ventricle: The right ventricular size is normal.  No increase in right ventricular wall thickness. Right ventricular systolic function is normal. Left Atrium: Left atrial size was normal in size. Right Atrium: Right atrial size was normal in size. Pericardium: There is no evidence of pericardial effusion. Mitral Valve: The mitral valve is normal in structure. Mild to moderate mitral annular calcification. Trivial mitral valve regurgitation. No evidence of mitral valve stenosis. Tricuspid Valve: There is a highly mobile filament on the tricupid valve that is not well visualized, given the clinical picture with bacteremia recommend TEE for clarification. Tricuspid valve regurgitation is trivial. No evidence of tricuspid stenosis. Aortic Valve: The aortic valve is normal in structure. Aortic valve regurgitation is not visualized. No aortic stenosis is present. Pulmonic Valve: The pulmonic valve was normal in structure. Pulmonic valve regurgitation is not visualized. No evidence of pulmonic stenosis. Aorta: The aortic root is normal in size and structure. Venous: The inferior vena cava is normal in size with greater than 50% respiratory variability, suggesting right atrial pressure of 3 mmHg. IAS/Shunts: No atrial level shunt detected by color flow Doppler.  LEFT VENTRICLE PLAX 2D LVIDd:         5.10 cm         Diastology LVIDs:         3.50 cm         LV e' medial:    6.74 cm/s LV PW:         1.10 cm         LV E/e' medial:  8.8 LV IVS:        1.00 cm         LV e' lateral:   9.08 cm/s LVOT diam:     2.30 cm         LV E/e' lateral: 6.5 LV SV:         89 LV SV Index:   45 LVOT Area:     4.15 cm        3D Volume EF                                LV 3D EF:    Left                                             ventricul LV  Volumes (MOD)                            ar LV vol d, MOD    104.0 ml                   ejection A2C:                                        fraction LV vol d, MOD    132.0 ml                   by 3D A4C:                                        volume is LV vol s, MOD  51.1 ml                    55 %. A2C: LV vol s, MOD    58.4 ml A4C:                           3D Volume EF: LV SV MOD A2C:   52.9 ml       3D EF:        55 % LV SV MOD A4C:   132.0 ml      LV EDV:       241 ml LV SV MOD BP:    67.7 ml       LV ESV:       108 ml                                LV SV:        133 ml RIGHT VENTRICLE             IVC RV S prime:     10.00 cm/s  IVC diam: 1.50 cm TAPSE (M-mode): 1.8 cm LEFT ATRIUM           Index        RIGHT ATRIUM           Index LA diam:      3.70 cm 1.86 cm/m   RA Area:     13.85 cm LA Vol (A2C): 30.1 ml 15.15 ml/m  RA Volume:   37.90 ml  19.07 ml/m LA Vol (A4C): 35.5 ml 17.86 ml/m  AORTIC VALVE LVOT Vmax:   111.00 cm/s LVOT Vmean:  70.600 cm/s LVOT VTI:    0.215 m  AORTA Ao Root diam: 3.80 cm Ao Asc diam:  3.30 cm MITRAL VALVE MV Area (PHT): 3.26 cm    SHUNTS MV Decel Time: 233 msec    Systemic VTI:  0.22 m MV E velocity: 59.30 cm/s  Systemic Diam: 2.30 cm MV A velocity: 67.25 cm/s MV E/A ratio:  0.88 Kardie Tobb DO Electronically signed by Thomasene Ripple DO Signature Date/Time: 01/18/2022/2:44:19 PM    Final    MR Lumbar Spine W Wo Contrast  Addendum Date: 01/17/2022   ADDENDUM REPORT: 01/17/2022 12:41 ADDENDUM: Study discussed by telephone with PA AMJAD ALI on 01/17/2022 at 12:36. Electronically Signed   By: Odessa Fleming M.D.   On: 01/17/2022 12:41   Result Date: 01/17/2022 CLINICAL DATA:  56 year old male with twisting injury, sudden onset back pain last month. Hyperglycemia (547). Query epidural abscess. EXAM: MRI LUMBAR SPINE WITHOUT AND WITH CONTRAST TECHNIQUE: Multiplanar and multiecho pulse sequences of the lumbar spine were obtained without and with intravenous contrast. CONTRAST:  75mL GADAVIST  GADOBUTROL 1 MMOL/ML IV SOLN COMPARISON:  Thoracic MRI today reported separately. FINDINGS: Segmentation:  Normal, concordant with the thoracic numbering today. Alignment: Preserved lumbar lordosis. No significant spondylolisthesis. Vertebrae: Confluent abnormal signal and enhancement about the left L4-L5 facet (series 10, image 14). Marrow edema there, most pronounced at the facet joint and in the right L4 pedicle. See additional details below. Background bone marrow signal is within normal limits. Normal visible sacrum and SI joints. No other No acute osseous abnormality identified. Conus medullaris and cauda equina: Conus extends to the T12-L1 level. No lower spinal cord or conus signal abnormality. Capacious spinal canal and in general the cauda equina nerve roots appear normal,  and there is no abnormal intradural enhancement. But there is abnormal posterior dural thickening and enhancement at L4 and L5 (series 10, image 10). See additional details of those levels below. Paraspinal and other soft tissues: Abnormal left paraspinal soft tissues beginning at the L1-L2 level and continuing through the L5-S1 level. Confluent muscle edema and enhancement, surrounding a complex posterior left paraspinal fluid collection centered at L4 and L5, see additional details below. Contralateral right lumbar paraspinal soft tissues are within normal limits. Visible ileo psoas muscles remain normal. Negative visible abdominal viscera. Disc levels: T12-L1 through L2-L3: Negative. L3-L4: Disc desiccation and circumferential disc bulge, but otherwise essentially negative (posterior left paraspinal fluid collection extends up to this level). L4-L5: L4-L5 disc remains normal, but the left facet is highly abnormal. There is abnormal facet joint fluid, the facet is inflamed, and there is intense surrounding abnormal soft tissue inflammation and enhancement. There is a multiloculated fluid collection extending cephalad and caudal from  the level of the facet joint in the left erector spinae muscle encompassing 17 x 22 x 62 mm (AP by transverse by CC), for an estimated volume of 12 mL. See series 10, image 14). And there is mild left lateral epidural space thickening and inflammation adjacent to the left facet (series 7, image 34 and series 11, image 34, with abnormal enhancement continuing into the left L4 nerve level but no discrete epidural abscess at this time. L5-S1: Disc degeneration. The left L5 facets remain normal at this time. IMPRESSION: 1. Septic Arthritis left L4-L5 facet with associated Osteomyelitis, multiloculated left erector spinae Muscle Abscess (12 mL) and intense regional soft tissue edema/inflammation. Inflammation in the left L4 neural foramen. Early inflammation and enhancement in the left lateral epidural space there, and involving the dorsal PACU meninges at L4 and L5. No epidural abscess or spinal stenosis at this time. 2. No superimposed lumbar discitis or other site of infection at this time. Electronically Signed: By: Odessa Fleming M.D. On: 01/17/2022 12:25   MR THORACIC SPINE W WO CONTRAST  Result Date: 01/17/2022 CLINICAL DATA:  56 year old male with twisting injury, sudden onset back pain last month. Hyperglycemia (547). Query epidural abscess. EXAM: MRI THORACIC WITHOUT AND WITH CONTRAST TECHNIQUE: Multiplanar and multiecho pulse sequences of the thoracic spine were obtained without and with intravenous contrast. CONTRAST:  8mL GADAVIST GADOBUTROL 1 MMOL/ML IV SOLN COMPARISON:  Lumbar MRI today reported separately. FINDINGS: Limited cervical spine imaging:  Negative. Thoracic spine segmentation:  Appears normal. Alignment:  Preserved thoracic kyphosis. No spondylolisthesis. Vertebrae: No marrow edema or evidence of acute osseous abnormality. Visualized bone marrow signal is within normal limits. Cord: Normal. Conus medullaris occurs below T11-T12. No abnormal intradural enhancement or dural thickening. Paraspinal and  other soft tissues: Negative. Disc levels: Isolated thoracic disc degeneration at T8-T9, but no disc herniation or stenosis. There is mild to moderate bilateral facet degeneration and hypertrophy at T10-T11, with up to mild T10 foraminal stenosis. Mild facet hypertrophy on the left at T4-T5 without stenosis. IMPRESSION: 1. No acute finding and essentially normal for age MRI appearance of the Thoracic Spine. 2. See abnormal Lumbar MRI reported separately. Electronically Signed   By: Odessa Fleming M.D.   On: 01/17/2022 12:17   DG Lumbar Spine Complete  Result Date: 01/11/2022 CLINICAL DATA:  Twisting injury with sudden onset low back pain a few days ago, unable to move without pain. EXAM: LUMBAR SPINE - COMPLETE 4+ VIEW COMPARISON:  CT abdomen and pelvis and reconstructions 08/21/2019. FINDINGS: There is  mild osteopenia without evidence of fractures. There is preservation of the normal vertebral body heights and normal alignment. There are moderate features of marginal osteophytosis of the lumbar spine without bridging osteophytes. There is mild facet spurring from L3-4 down without visible significant foraminal narrowing on the obliques. Right SI joint is patent. There is ankylosis across the upper to mid left SI joint, as before. Moderate to severe stool retention is again shown. IMPRESSION: Osteopenia and degenerative change without evidence of fractures or malalignment. Osteopenia somewhat unusual in a male of this age. Clinical correlation advised. Chronic constipation. Electronically Signed   By: Almira Bar M.D.   On: 01/11/2022 20:37    Labs:  CBC: Recent Labs    01/18/22 0331 01/19/22 0939 01/28/22 0446 01/29/22 0419  WBC 14.1* 13.6* 8.6 8.9  HGB 11.4* 11.8* 10.3* 11.3*  HCT 32.7* 33.6* 31.3* 34.6*  PLT 304 325 484* 514*    COAGS: Recent Labs    01/17/22 1305  INR 1.1  APTT 31    BMP: Recent Labs    01/20/22 0807 01/21/22 0439 01/23/22 1128 01/28/22 0446  NA 126* 131* 132* 135   K 4.0 3.9 4.3 4.1  CL 95* 95* 97* 102  CO2 GLUCOSE 269* 148* 238* 213*  BUN 25*  CALCIUM 7.9* 8.4* 8.4* 8.4*  CREATININE 0.55* 0.54* 0.72 0.60*  GFRNONAA >60 >60 >60 >60    LIVER FUNCTION TESTS: Recent Labs    01/17/22 0722 01/18/22 0331 01/21/22 0439  BILITOT 0.8 0.8 0.3  AST 16 15 14*  ALT ALKPHOS 109 108 105  PROT 6.8 6.1* 6.5  ALBUMIN 2.7* 2.4* 2.4*    TUMOR MARKERS: No results for input(s): "AFPTM", "CEA", "CA199", "CHROMGRNA" in the last 8760 hours.  Assessment and Plan: History of DM type II, HTN, polysubstance abuse, tobacco abuse, hep C and HTN.  Patient presented to ED twice in 1 week complaining of progressively worsening lower back pain for 2 weeks.  MRI spine confirmed L4-L5 facet septic arthritis with associated osteomyelitis and multiloculated left erector spinae muscle abscess.  Patient was referred to IR by Lynden Oxford, MD for aspiration of abscess. Procedure was approved by Dr. Lowella Dandy.   MR Lumbar spine 01/31/22: IMPRESSION: 1. Progressed left L4 and L5 facet and pedicle Osteomyelitis from earlier this month, with underlying septic facet joint. 2. Confluent left lateral epidural inflammation now from the left L4 to the left S1 nerve levels. No epidural abscess. 3. But ongoing overlying Left Erector Spinae Intramuscular Abscess centered at L4-L5, estimated abscess volume 15 mm and tremendous regional paraspinal muscle and soft tissue inflammation.  Pt resting in bed. He is A&O, calm and pleasant.  He is in no distress.  Pt ate @ 0800 today. He is now NPO for 1400 procedure.  No thinners noted on MAR.   Risks and benefits of aspiration with possible drain placement of left erector spinae muscle abscess was discussed with the patient and/or patient's family including, but not limited to bleeding, infection, damage to adjacent structures or low yield requiring additional tests.  All of the questions were answered and there  is agreement to proceed.  Consent signed and in chart.   Thank you for this interesting consult.  I greatly enjoyed meeting Tanner [name] and look forward to participating in their care.  A copy of this report was sent to the requesting provider on this date.  Electronically Signed: Shon Hough, NP  02/02/2022, 10:16 AM   I spent a total of 20 minutes in face to face in clinical consultation, greater than 50% of which was counseling/coordinating care for left erector spinae muscle abscess.

## 2022-02-02 NOTE — Progress Notes (Signed)
Pt and brother requested HIPPA form, so that pt's brother is able to be notified about updates in his care and possible rehab placement. Pt refuses info on Healthcare POA, and states that he doesn't want his brother to be able to make decisions for him, as he is able to do that himself. Educated pt that a HIPPA form has already been completed, and is not necessary. Pt orientedx4 and verbally consents to adding [employer] as contact. Updated pt contact information. Notified brother, Lorie Apley, of conversation and update. Placed TOC consult per pt's request for substance abuse resources upon discharge.

## 2022-02-02 NOTE — Progress Notes (Addendum)
Subjective:  Still with back pain, rib pain   Antibiotics:  Anti-infectives (From admission, onward)    Start     Dose/Rate Route Frequency Ordered Stop   01/18/22 0600  ceFAZolin (ANCEF) IVPB 2g/100 mL premix        2 g 200 mL/hr over 30 Minutes Intravenous Every 8 hours 01/18/22 0330     01/18/22 0200  vancomycin (VANCOREADY) IVPB 1250 mg/250 mL  Status:  Discontinued        1,250 mg 166.7 mL/hr over 90 Minutes Intravenous Every 12 hours 01/17/22 1741 01/18/22 0330   01/17/22 2200  ceFEPIme (MAXIPIME) 2 g in sodium chloride 0.9 % 100 mL IVPB  Status:  Discontinued        2 g 200 mL/hr over 30 Minutes Intravenous Every 8 hours 01/17/22 1645 01/18/22 0330   01/17/22 1400  vancomycin (VANCOREADY) IVPB 1500 mg/300 mL        1,500 mg 150 mL/hr over 120 Minutes Intravenous  Once 01/17/22 1323 01/17/22 1629   01/17/22 1330  ceFEPIme (MAXIPIME) 2 g in sodium chloride 0.9 % 100 mL IVPB        2 g 200 mL/hr over 30 Minutes Intravenous  Once 01/17/22 1323 01/17/22 1629       Medications: Scheduled Meds:  amLODipine  5 mg Oral Daily   celecoxib  200 mg Oral BID   cyclobenzaprine  10 mg Oral TID   [START ON 02/03/2022] enoxaparin (LOVENOX) injection  40 mg Subcutaneous Q24H   feeding supplement (GLUCERNA SHAKE)  237 mL Oral TID BM   gabapentin  400 mg Oral TID   insulin aspart  0-15 Units Subcutaneous TID WC   insulin aspart  0-5 Units Subcutaneous QHS   insulin glargine-yfgn  12 Units Subcutaneous Daily   ketorolac  15 mg Intravenous Q8H   melatonin  5 mg Oral QHS   multivitamin with minerals  1 tablet Oral Daily   nicotine  21 mg Transdermal Daily   pantoprazole  40 mg Oral Daily   pneumococcal 20-valent conjugate vaccine  0.5 mL Intramuscular Tomorrow-1000   Ensure Max Protein  11 oz Oral BID   senna-docusate  1 tablet Oral BID   Continuous Infusions:   ceFAZolin (ANCEF) IV 2 g (02/02/22 1546)   PRN Meds:.acetaminophen **OR** acetaminophen, ALPRAZolam,  ondansetron **OR** ondansetron (ZOFRAN) IV, oxyCODONE-acetaminophen, traMADol    Objective: Weight change:   Intake/Output Summary (Last 24 hours) at 02/02/2022 1605 Last data filed at 02/02/2022 1400 Gross per 24 hour  Intake 1350 ml  Output 3100 ml  Net -1750 ml    Blood pressure 119/69, pulse 81, temperature 97.9 F (36.6 C), temperature source Oral, resp. rate 15, height 6' (1.829 m), weight 77 kg, SpO2 96 %. Temp:  [97.5 F (36.4 C)-98.2 F (36.8 C)] 97.9 F (36.6 C) (06/20 1533) Pulse Rate:  [62-91] 81 (06/20 1533) Resp:  [14-22] 15 (06/20 1533) BP: (110-137)/(65-71) 119/69 (06/20 1533) SpO2:  [96 %-100 %] 96 % (06/20 1533)  Physical Exam: Physical Exam Constitutional:      Appearance: He is well-developed.  HENT:     Head: Normocephalic and atraumatic.  Eyes:     Conjunctiva/sclera: Conjunctivae normal.  Cardiovascular:     Rate and Rhythm: Normal rate and regular rhythm.  Pulmonary:     Effort: Pulmonary effort is normal. No respiratory distress.     Breath sounds: No wheezing.  Abdominal:     General: There is no distension.  Palpations: Abdomen is soft.  Musculoskeletal:        General: Normal range of motion.     Cervical back: Normal range of motion and neck supple.  Skin:    General: Skin is warm and dry.     Findings: No erythema or rash.  Neurological:     Mental Status: He is alert and oriented to person, place, and time.  Psychiatric:        Mood and Affect: Mood normal.        Behavior: Behavior normal.        Thought Content: Thought content normal.        Judgment: Judgment normal.      CBC:    BMET No results for input(s): "NA", "K", "CL", "CO2", "GLUCOSE", "BUN", "CREATININE", "CALCIUM" in the last 72 hours.   Liver Panel  No results for input(s): "PROT", "ALBUMIN", "AST", "ALT", "ALKPHOS", "BILITOT", "BILIDIR", "IBILI" in the last 72 hours.     Sedimentation Rate No results for input(s): "ESRSEDRATE" in the last 72  hours. C-Reactive Protein No results for input(s): "CRP" in the last 72 hours.  Micro Results: Recent Results (from the past 720 hour(s))  Blood culture (routine x 2)     Status: Abnormal   Collection Time: 01/17/22  1:40 PM   Specimen: BLOOD  Result Value Ref Range Status   Specimen Description   Final    BLOOD BLOOD LEFT FOREARM Performed at [facility], [address], [place], [place] [address]    Special Requests   Final    BOTTLES DRAWN AEROBIC AND ANAEROBIC Blood Culture adequate volume Performed at [facility], [address] [place], [place] [address]    Culture  Setup Time   Final    GRAM POSITIVE COCCI ANAEROBIC BOTTLE ONLY IN BOTH AEROBIC AND ANAEROBIC BOTTLES CRITICAL RESULT CALLED TO, READ BACK BY AND VERIFIED WITH: PHARMD MICHELLE LILLISTON 01/18/22@3 :11 BY TW Performed at [facility], [address], [place], [place] [address]    Culture STAPHYLOCOCCUS AUREUS (A)  Final   Report Status 01/20/2022 FINAL  Final   Organism ID, Bacteria STAPHYLOCOCCUS AUREUS  Final      Susceptibility   Staphylococcus aureus - MIC*    CIPROFLOXACIN <=0.5 SENSITIVE Sensitive     ERYTHROMYCIN <=0.25 SENSITIVE Sensitive     GENTAMICIN <=0.5 SENSITIVE Sensitive     OXACILLIN <=0.25 SENSITIVE Sensitive     TETRACYCLINE <=1 SENSITIVE Sensitive     VANCOMYCIN 1 SENSITIVE Sensitive     TRIMETH/SULFA <=10 SENSITIVE Sensitive     CLINDAMYCIN <=0.25 SENSITIVE Sensitive     RIFAMPIN <=0.5 SENSITIVE Sensitive     Inducible Clindamycin NEGATIVE Sensitive     * STAPHYLOCOCCUS AUREUS  Blood Culture ID Panel (Reflexed)     Status: Abnormal   Collection Time: 01/17/22  1:40 PM  Result Value Ref Range Status   Enterococcus faecalis NOT DETECTED NOT DETECTED Final   Enterococcus Faecium NOT DETECTED NOT DETECTED Final   Listeria monocytogenes NOT DETECTED NOT DETECTED Final   Staphylococcus species DETECTED (A) NOT DETECTED Final    Comment:  CRITICAL RESULT CALLED TO, READ BACK BY AND VERIFIED WITH: PHARMD MICHELLE LILLISTON 01/18/22@3 :11 BY TW    Staphylococcus aureus (BCID) DETECTED (A) NOT DETECTED Final    Comment: CRITICAL RESULT CALLED TO, READ BACK BY AND VERIFIED WITH: PHARMD MICHELLE LILLISTON 01/18/22@3 :11 BY TW    Staphylococcus epidermidis NOT DETECTED NOT DETECTED Final   Staphylococcus lugdunensis NOT  DETECTED NOT DETECTED Final   Streptococcus species NOT DETECTED NOT DETECTED Final   Streptococcus agalactiae NOT DETECTED NOT DETECTED Final   Streptococcus pneumoniae NOT DETECTED NOT DETECTED Final   Streptococcus pyogenes NOT DETECTED NOT DETECTED Final   A.calcoaceticus-baumannii NOT DETECTED NOT DETECTED Final   Bacteroides fragilis NOT DETECTED NOT DETECTED Final   Enterobacterales NOT DETECTED NOT DETECTED Final   Enterobacter cloacae complex NOT DETECTED NOT DETECTED Final   Escherichia coli NOT DETECTED NOT DETECTED Final   Klebsiella aerogenes NOT DETECTED NOT DETECTED Final   Klebsiella oxytoca NOT DETECTED NOT DETECTED Final   Klebsiella pneumoniae NOT DETECTED NOT DETECTED Final   Proteus species NOT DETECTED NOT DETECTED Final   Salmonella species NOT DETECTED NOT DETECTED Final   Serratia marcescens NOT DETECTED NOT DETECTED Final   Haemophilus influenzae NOT DETECTED NOT DETECTED Final   Neisseria meningitidis NOT DETECTED NOT DETECTED Final   Pseudomonas aeruginosa NOT DETECTED NOT DETECTED Final   Stenotrophomonas maltophilia NOT DETECTED NOT DETECTED Final   Candida albicans NOT DETECTED NOT DETECTED Final   Candida auris NOT DETECTED NOT DETECTED Final   Candida glabrata NOT DETECTED NOT DETECTED Final   Candida krusei NOT DETECTED NOT DETECTED Final   Candida parapsilosis NOT DETECTED NOT DETECTED Final   Candida tropicalis NOT DETECTED NOT DETECTED Final   Cryptococcus neoformans/gattii NOT DETECTED NOT DETECTED Final   Meth resistant mecA/C and MREJ NOT DETECTED NOT DETECTED Final     Comment: Performed at [facility], [address] [place], [place] [address]  Blood culture (routine x 2)     Status: Abnormal   Collection Time: 01/17/22  1:50 PM   Specimen: BLOOD  Result Value Ref Range Status   Specimen Description   Final    BLOOD BLOOD RIGHT HAND Performed at [facility], [address] [place], [place] [address]    Special Requests   Final    BOTTLES DRAWN AEROBIC ONLY Blood Culture adequate volume Performed at [facility], [address] [place], [place] [address]    Culture  Setup Time   Final    GRAM POSITIVE COCCI AEROBIC BOTTLE ONLY CRITICAL VALUE NOTED.  VALUE IS CONSISTENT WITH PREVIOUSLY REPORTED AND CALLED VALUE.    Culture (A)  Final    STAPHYLOCOCCUS AUREUS SUSCEPTIBILITIES PERFORMED ON PREVIOUS CULTURE WITHIN THE LAST 5 DAYS. Performed at [facility], [address] [place], [place] [address]    Report Status 01/20/2022 FINAL  Final  Blood culture (routine x 2)     Status: Abnormal   Collection Time: 01/17/22  2:05 PM   Specimen: BLOOD  Result Value Ref Range Status   Specimen Description   Final    BLOOD RIGHT ANTECUBITAL Performed at [facility], [address] [place], [place] [address]    Special Requests   Final    BOTTLES DRAWN AEROBIC AND ANAEROBIC Blood Culture results may not be optimal due to an excessive volume of blood received in culture bottles Performed at [facility], [address] [place], [place] [address]    Culture  Setup Time   Final    GRAM POSITIVE COCCI AEROBIC BOTTLE ONLY CRITICAL VALUE NOTED.  VALUE IS CONSISTENT WITH PREVIOUSLY REPORTED AND CALLED VALUE. IN BOTH AEROBIC AND ANAEROBIC BOTTLES    Culture (A)  Final    STAPHYLOCOCCUS AUREUS SUSCEPTIBILITIES PERFORMED ON PREVIOUS CULTURE WITHIN THE LAST 5 DAYS. Performed at [facility], [address] [place], [place] [address]    Report Status 01/20/2022  FINAL  Final  Culture, blood (Routine X 2) w Reflex to ID Panel     Status: None   Collection Time: 01/19/22  9:40 AM   Specimen: BLOOD  Result Value Ref Range Status   Specimen Description   Final    BLOOD RIGHT ANTECUBITAL Performed at [facility], [address], [place], [place] [address]    Special Requests   Final    BOTTLES DRAWN AEROBIC AND ANAEROBIC Blood Culture adequate volume Performed at [facility], [address], [place], [place] [address]    Culture   Final    NO GROWTH 5 DAYS Performed at [facility], [address] [place], [place] [address]    Report Status 01/24/2022 FINAL  Final  Culture, blood (Routine X 2) w Reflex to ID Panel     Status: None   Collection Time: 01/19/22  9:46 AM   Specimen: BLOOD  Result Value Ref Range Status   Specimen Description   Final    BLOOD BLOOD LEFT FOREARM Performed at [facility], [address] [place], [place] [address]    Special Requests   Final    BOTTLES DRAWN AEROBIC AND ANAEROBIC Blood Culture adequate volume Performed at [facility], [address], [place], [place] [address]    Culture   Final    NO GROWTH 5 DAYS Performed at [facility], [address], [place], [place] [address]    Report Status 01/24/2022 FINAL  Final    Studies/Results: No results found.    Assessment/Plan:  INTERVAL HISTORY:   Patient is now status post IR guided aspirate of the abscess   Principal Problem:   MSSA bacteremia Active Problems:   Tobacco abuse   Drug abuse (HCC)   Septic arthritis of lumbar spine (HCC)   Type 2 diabetes mellitus with hyperglycemia (HCC)   Hypocalcemia   Normocytic anemia   Hyponatremia   Moderate protein malnutrition (HCC)   Hypomagnesemia    Tanner [name] is a 56 y.o. male with history of injection drug use with MSSA bacteremia, tricuspid valve endocarditis and septic embolization to the  lungs facet septic arthritis with abscess abscess in the erector spinae muscles.  He is now status post repeat MRI of the lumbar spine and pelvis.  There is evidence of progressive L4 and L5 facet pedicle osteomyelitis from earlier in the month with septic facet joint and confluent left lateral epidural inflammation from L4-S1 but without a discrete epidural abscess.  There is ongoing erector spinae inflammatory myositis as well as an abscess at L4-L5 with a volume of 15 cm.   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  Greatly appreciate radiology aspirating purulent area in in the erector spinae muscles  Continue cefazolin while in house  He will need protracted therapy eventually with oral antibiotics.  I have some concern about the epidural inflammation in L4-S1 and would recommend repeating an MRI in 2 to 4 weeks to reassess this area  #2 IVDU: This precludes conventional treatment of his endocarditis and facet septic arthritis vertebral osteomyelitis with prolonged IV antibiotics outside of the hospital.  It sounds like he is largely been using methamphetamines   ]#3 Low energy and  his concern that he has low testosterone levels we will check a.m. testosterone level with FSH LH and PSA     LOS: 16 days   Acey Lav 02/02/2022, 4:05 PM

## 2022-02-03 ENCOUNTER — Inpatient Hospital Stay (HOSPITAL_COMMUNITY): Payer: Self-pay

## 2022-02-03 DIAGNOSIS — Z792 Long term (current) use of antibiotics: Secondary | ICD-10-CM

## 2022-02-03 DIAGNOSIS — I330 Acute and subacute infective endocarditis: Secondary | ICD-10-CM

## 2022-02-03 DIAGNOSIS — M6008 Infective myositis, other site: Secondary | ICD-10-CM

## 2022-02-03 DIAGNOSIS — G4700 Insomnia, unspecified: Secondary | ICD-10-CM

## 2022-02-03 DIAGNOSIS — M4626 Osteomyelitis of vertebra, lumbar region: Secondary | ICD-10-CM

## 2022-02-03 DIAGNOSIS — Z720 Tobacco use: Secondary | ICD-10-CM

## 2022-02-03 DIAGNOSIS — F1990 Other psychoactive substance use, unspecified, uncomplicated: Secondary | ICD-10-CM

## 2022-02-03 DIAGNOSIS — G8929 Other chronic pain: Secondary | ICD-10-CM

## 2022-02-03 DIAGNOSIS — K5900 Constipation, unspecified: Secondary | ICD-10-CM

## 2022-02-03 LAB — BASIC METABOLIC PANEL
Anion gap: 6 (ref 5–15)
BUN: 31 mg/dL — ABNORMAL HIGH (ref 6–20)
CO2: 33 mmol/L — ABNORMAL HIGH (ref 22–32)
Calcium: 9.7 mg/dL (ref 8.9–10.3)
Chloride: 99 mmol/L (ref 98–111)
Creatinine, Ser: 0.86 mg/dL (ref 0.61–1.24)
GFR, Estimated: >60 mL/min (ref >60)
Glucose, Bld: 118 mg/dL — ABNORMAL HIGH (ref 70–99)
Potassium: 4.9 mmol/L (ref 3.5–5.1)
Sodium: 138 mmol/L (ref 135–145)

## 2022-02-03 LAB — GLUCOSE, CAPILLARY
Glucose-Capillary: 180 mg/dL — ABNORMAL HIGH (ref 70–99)
Glucose-Capillary: 233 mg/dL — ABNORMAL HIGH (ref 70–99)
Glucose-Capillary: 303 mg/dL — ABNORMAL HIGH (ref 70–99)
Glucose-Capillary: 279 mg/dL — ABNORMAL HIGH (ref 70–99)

## 2022-02-03 LAB — CBC
HCT: 32.6 % — ABNORMAL LOW (ref 39.0–52.0)
Hemoglobin: 10.4 g/dL — ABNORMAL LOW (ref 13.0–17.0)
MCH: 27 pg (ref 26.0–34.0)
MCHC: 31.9 g/dL (ref 30.0–36.0)
MCV: 84.7 fL (ref 80.0–100.0)
Platelets: 358 10*3/uL (ref 150–400)
RBC: 3.85 MIL/uL — ABNORMAL LOW (ref 4.22–5.81)
RDW: 13.2 % (ref 11.5–15.5)
WBC: 5.5 10*3/uL (ref 4.0–10.5)
nRBC: 0 % (ref 0.0–0.2)

## 2022-02-03 LAB — PSA: Prostatic Specific Antigen: 0.61 ng/mL (ref 0.00–4.00)

## 2022-02-03 MED ORDER — INSULIN ASPART 100 UNIT/ML IJ SOLN
4.0000 [IU] | Freq: Three times a day (TID) | INTRAMUSCULAR | Status: DC
  Administered 2022-02-03: 4 [IU] via SUBCUTANEOUS

## 2022-02-03 MED ORDER — INSULIN ASPART 100 UNIT/ML IJ SOLN
0.0000 [IU] | Freq: Three times a day (TID) | INTRAMUSCULAR | Status: DC
  Administered 2022-02-03: 11 [IU] via SUBCUTANEOUS
  Administered 2022-02-04: 7 [IU] via SUBCUTANEOUS
  Administered 2022-02-04: 3 [IU] via SUBCUTANEOUS
  Administered 2022-02-04: 8 [IU] via SUBCUTANEOUS
  Administered 2022-02-05: 5 [IU] via SUBCUTANEOUS
  Administered 2022-02-05 – 2022-02-06 (×3): 3 [IU] via SUBCUTANEOUS
  Administered 2022-02-07: 5 [IU] via SUBCUTANEOUS
  Administered 2022-02-07: 3 [IU] via SUBCUTANEOUS
  Administered 2022-02-08: 5 [IU] via SUBCUTANEOUS
  Administered 2022-02-08 – 2022-02-09 (×2): 8 [IU] via SUBCUTANEOUS
  Administered 2022-02-09: 3 [IU] via SUBCUTANEOUS
  Administered 2022-02-10: 8 [IU] via SUBCUTANEOUS
  Administered 2022-02-10 – 2022-02-12 (×4): 2 [IU] via SUBCUTANEOUS
  Administered 2022-02-12 – 2022-02-13 (×2): 3 [IU] via SUBCUTANEOUS
  Administered 2022-02-13 – 2022-02-14 (×3): 2 [IU] via SUBCUTANEOUS
  Administered 2022-02-14 – 2022-02-15 (×3): 3 [IU] via SUBCUTANEOUS
  Administered 2022-02-16: 2 [IU] via SUBCUTANEOUS
  Administered 2022-02-16: 3 [IU] via SUBCUTANEOUS
  Administered 2022-02-16: 5 [IU] via SUBCUTANEOUS
  Administered 2022-02-17: 2 [IU] via SUBCUTANEOUS
  Administered 2022-02-17: 3 [IU] via SUBCUTANEOUS
  Administered 2022-02-18: 5 [IU] via SUBCUTANEOUS
  Administered 2022-02-18 – 2022-02-19 (×2): 2 [IU] via SUBCUTANEOUS

## 2022-02-03 MED ORDER — OXYCODONE-ACETAMINOPHEN 5-325 MG PO TABS
1.0000 | ORAL_TABLET | ORAL | Status: DC | PRN
  Administered 2022-02-03 – 2022-02-04 (×4): 2 via ORAL
  Administered 2022-02-05: 1 via ORAL
  Administered 2022-02-05 – 2022-02-06 (×6): 2 via ORAL
  Administered 2022-02-07: 1 via ORAL
  Administered 2022-02-07 (×4): 2 via ORAL
  Administered 2022-02-07: 1 via ORAL
  Administered 2022-02-08 – 2022-02-19 (×48): 2 via ORAL
  Filled 2022-02-03 (×3): qty 2
  Filled 2022-02-03: qty 1
  Filled 2022-02-03 (×43): qty 2
  Filled 2022-02-03: qty 1
  Filled 2022-02-03 (×2): qty 2
  Filled 2022-02-03: qty 1
  Filled 2022-02-03 (×15): qty 2

## 2022-02-03 MED ORDER — INSULIN GLARGINE-YFGN 100 UNIT/ML ~~LOC~~ SOLN
14.0000 [IU] | Freq: Every day | SUBCUTANEOUS | Status: DC
  Administered 2022-02-03: 14 [IU] via SUBCUTANEOUS
  Filled 2022-02-03 (×2): qty 0.14

## 2022-02-03 MED ORDER — INSULIN ASPART 100 UNIT/ML IJ SOLN
0.0000 [IU] | Freq: Every day | INTRAMUSCULAR | Status: DC
  Administered 2022-02-03: 3 [IU] via SUBCUTANEOUS
  Administered 2022-02-09: 2 [IU] via SUBCUTANEOUS

## 2022-02-03 NOTE — Progress Notes (Signed)
PROGRESS NOTE  Tanner [name] DQQ:[id number] DOB: 07-28-1966   PCP: Patient, No Pcp Per  Patient is from: Home  DOA: 01/17/2022 LOS: 17  Chief complaints Chief Complaint  Patient presents with   Back Pain     Brief Narrative / Interim history: 56 year old M with PMH of DM-2, HTN, polysubstance use including IVDU with crystal meth, tobacco use disorder and hepatitis C presenting with back pain and found to have MSSA bacteremia with tricuspid valve endocarditis, septic emboli to the lungs, septic arthritis/osteomyelitis of L4-5 facet, epidural inflammation in L4-S1 as well as left erector spinae muscle abscess.  He underwent US-guided drainage of left erector spinae abscess on 02/02/2022.  Remains on IV Ancef per infectious disease.  Subjective: Seen and examined earlier this morning.  No major events overnight of this morning.  Complains of bilateral rib pain.  He rates pain 9/10 although he was sleeping and does not appear to be in that much distress when I walked in.  No other complaints.  Objective: Vitals:   02/02/22 1830 02/02/22 2032 02/03/22 0556 02/03/22 1345  BP: 138/75 123/69 138/83 125/75  Pulse: 82 74 71 77  Resp: 16 17 17 18   Temp: 98 F (36.7 C) 98.4 F (36.9 C) (!) 97.5 F (36.4 C) 98.6 F (37 C)  TempSrc: Oral Oral Oral   SpO2: 97% 98% 95% 98%  Weight:      Height:        Examination:  GENERAL: No apparent distress.  Nontoxic. HEENT: MMM.  Vision and hearing grossly intact.  NECK: Supple.  No apparent JVD.  RESP:  No IWOB.  Fair aeration bilaterally. CVS:  RRR. Heart sounds normal.  ABD/GI/GU: BS+. Abd soft, NTND.  MSK/EXT:  Moves extremities. No apparent deformity. No edema.  SKIN: no apparent skin lesion or wound NEURO: Awake, alert and oriented appropriately.  No apparent focal neuro deficit. PSYCH: Calm. Normal affect.   Procedures:  6/20-US guided aspiration of left erector spinae muscle abscess  Microbiology summarized: 6/4-blood culture with  MSSA. 6/6-blood culture NGTD  Assessment and plan: Principal Problem:   MSSA bacteremia Active Problems:   Septic arthritis of lumbar spine (HCC)   IVDU (intravenous drug user)   Osteomyelitis of lumbar spine (HCC)   Left erector spinae muscle abscess   Infective endocarditis of tricuspid valve   Chronic pain   Insomnia   Controlled type 2 diabetes mellitus without complication, without long-term current use of insulin (HCC)   Essential hypertension   Constipation   Hypocalcemia   Hyponatremia   Hypomagnesemia   Tobacco abuse   Normocytic anemia  MSSA bacteremia/infective TV endocarditis/septic emboli to the lung/L4-5 septic arthritis and osteomyelitis/epidural inflammation/left erector spinae muscle abscess -History of IVDU with crystal meth -6/4 blood culture MSSA -6/6 blood culture NGTD -TTE with TV vegetation.  TEE was not needed -MRI T-spine without acute finding -On IV Ancef per ID -Repeat MRI in 2 to 4 weeks to reassess L4-S1 epidural inflammation   IVDU -Excludes him from home IV antibiotics  Chronic pain -Continue Flexeril, tramadol, Percocet, Celexa and gabapentin     Insomnia -Continue current regimen.   Uncontrolled NIDDM-2 with hyperglycemia: A1c 10.8% on 6/5. Recent Labs  Lab 02/02/22 1135 02/02/22 1738 02/02/22 2030 02/03/22 0710 02/03/22 1130  GLUCAP 142* 140* 126* 180* 233*  -Continue SSI-moderate -Add NovoLog 4 units 3 times daily with meals -Increase Semglee from 12 to 14 units daily   Essential hypertension: Normotensive. -Continue low-dose amlodipine   Constipation: Reports normal bowel  movement on 6/20 -Bowel regimen   Hypocalcemia/Hyponatremia/Hypomagnesemia: Resolved.  Tobacco use disorder -Patient was counseled to quit. -Nicotine patch   Normocytic anemia: H&H relatively stable. Recent Labs    10/21/21 1153 01/17/22 0722 01/18/22 0331 01/19/22 0939 01/28/22 0446 01/29/22 0419 02/03/22 0509  HGB 13.6 11.1* 11.4* 11.8*  10.3* 11.3* 10.4*  -Continue monitoring  Increased nutrient needs Body mass index is 23.02 kg/m. Nutrition Problem: Increased nutrient needs Etiology: acute illness (polysubstance abuse) Signs/Symptoms: estimated needs Interventions: Ensure Enlive (each supplement provides 350kcal and 20 grams of protein), MVI, Liberalize Diet   DVT prophylaxis:  enoxaparin (LOVENOX) injection 40 mg Start: 02/03/22 1000 SCDs Start: 01/17/22 1405  Code Status: Full code Family Communication: None at bedside Level of care: Med-Surg Status is: Inpatient Remains inpatient appropriate because: Due to IV antibiotics   Final disposition: TBD Consultants:  Infectious disease Interventional radiology  Sch Meds:  Scheduled Meds:  amLODipine  5 mg Oral Daily   celecoxib  200 mg Oral BID   cyclobenzaprine  10 mg Oral TID   enoxaparin (LOVENOX) injection  40 mg Subcutaneous Q24H   feeding supplement (GLUCERNA SHAKE)  237 mL Oral TID BM   gabapentin  400 mg Oral TID   insulin aspart  0-15 Units Subcutaneous TID WC   insulin aspart  0-5 Units Subcutaneous QHS   insulin aspart  4 Units Subcutaneous TID WC   insulin glargine-yfgn  14 Units Subcutaneous Daily   melatonin  5 mg Oral QHS   multivitamin with minerals  1 tablet Oral Daily   nicotine  21 mg Transdermal Daily   pantoprazole  40 mg Oral Daily   pneumococcal 20-valent conjugate vaccine  0.5 mL Intramuscular Tomorrow-1000   Ensure Max Protein  11 oz Oral BID   senna-docusate  1 tablet Oral BID   Continuous Infusions:   ceFAZolin (ANCEF) IV 2 g (02/03/22 0522)   PRN Meds:.acetaminophen **OR** acetaminophen, ALPRAZolam, ondansetron **OR** ondansetron (ZOFRAN) IV, oxyCODONE-acetaminophen, traMADol  Antimicrobials: Anti-infectives (From admission, onward)    Start     Dose/Rate Route Frequency Ordered Stop   01/18/22 0600  ceFAZolin (ANCEF) IVPB 2g/100 mL premix        2 g 200 mL/hr over 30 Minutes Intravenous Every 8 hours 01/18/22 0330      01/18/22 0200  vancomycin (VANCOREADY) IVPB 1250 mg/250 mL  Status:  Discontinued        1,250 mg 166.7 mL/hr over 90 Minutes Intravenous Every 12 hours 01/17/22 1741 01/18/22 0330   01/17/22 2200  ceFEPIme (MAXIPIME) 2 g in sodium chloride 0.9 % 100 mL IVPB  Status:  Discontinued        2 g 200 mL/hr over 30 Minutes Intravenous Every 8 hours 01/17/22 1645 01/18/22 0330   01/17/22 1400  vancomycin (VANCOREADY) IVPB 1500 mg/300 mL        1,500 mg 150 mL/hr over 120 Minutes Intravenous  Once 01/17/22 1323 01/17/22 1629   01/17/22 1330  ceFEPIme (MAXIPIME) 2 g in sodium chloride 0.9 % 100 mL IVPB        2 g 200 mL/hr over 30 Minutes Intravenous  Once 01/17/22 1323 01/17/22 1629        I have personally reviewed the following labs and images: CBC: Recent Labs  Lab 01/28/22 0446 01/29/22 0419 02/03/22 0509  WBC 8.6 8.9 5.5  NEUTROABS 5.4 5.8  --   HGB 10.3* 11.3* 10.4*  HCT 31.3* 34.6* 32.6*  MCV 83.2 83.6 84.7  PLT 484* 514* 358  BMP &GFR Recent Labs  Lab 01/28/22 0446 01/29/22 0419 02/03/22 0509  NA 135  --  138  K 4.1  --  4.9  CL 102  --  99  CO2 26  --  33*  GLUCOSE 213*  --  118*  BUN 25*  --  31*  CREATININE 0.60*  --  0.86  CALCIUM 8.4*  --  9.7  MG 1.6* 1.8  --    Estimated Creatinine Clearance: 103.2 mL/min (by C-G formula based on SCr of 0.86 mg/dL). Liver & Pancreas: No results for input(s): "AST", "ALT", "ALKPHOS", "BILITOT", "PROT", "ALBUMIN" in the last 168 hours. No results for input(s): "LIPASE", "AMYLASE" in the last 168 hours. No results for input(s): "AMMONIA" in the last 168 hours. Diabetic: No results for input(s): "HGBA1C" in the last 72 hours. Recent Labs  Lab 02/02/22 1135 02/02/22 1738 02/02/22 2030 02/03/22 0710 02/03/22 1130  GLUCAP 142* 140* 126* 180* 233*   Cardiac Enzymes: No results for input(s): "CKTOTAL", "CKMB", "CKMBINDEX", "TROPONINI" in the last 168 hours. No results for input(s): "PROBNP" in the last 8760  hours. Coagulation Profile: No results for input(s): "INR", "PROTIME" in the last 168 hours. Thyroid Function Tests: No results for input(s): "TSH", "T4TOTAL", "FREET4", "T3FREE", "THYROIDAB" in the last 72 hours. Lipid Profile: No results for input(s): "CHOL", "HDL", "LDLCALC", "TRIG", "CHOLHDL", "LDLDIRECT" in the last 72 hours. Anemia Panel: No results for input(s): "VITAMINB12", "FOLATE", "FERRITIN", "TIBC", "IRON", "RETICCTPCT" in the last 72 hours. Urine analysis:    Component Value Date/Time   COLORURINE YELLOW 08/21/2019 1812   APPEARANCEUR CLEAR 08/21/2019 1812   LABSPEC 1.025 08/21/2019 1812   PHURINE 6.5 08/21/2019 1812   GLUCOSEU 250 (A) 08/21/2019 1812   HGBUR NEGATIVE 08/21/2019 1812   BILIRUBINUR LARGE (A) 08/21/2019 1812   KETONESUR 15 (A) 08/21/2019 1812   PROTEINUR NEGATIVE 08/21/2019 1812   NITRITE NEGATIVE 08/21/2019 1812   LEUKOCYTESUR NEGATIVE 08/21/2019 1812   Sepsis Labs: Invalid input(s): "PROCALCITONIN", "LACTICIDVEN"  Microbiology: Recent Results (from the past 240 hour(s))  Aerobic/Anaerobic Culture w Gram Stain (surgical/deep wound)     Status: None (Preliminary result)   Collection Time: 02/02/22  3:06 PM   Specimen: Abscess  Result Value Ref Range Status   Specimen Description   Final    ABSCESS Performed at [facility], [address], [place], [place] [address]    Special Requests   Final    NONE Performed at [facility], [address] [place], [place] [address]    Gram Stain   Final    ABUNDANT WBC PRESENT, PREDOMINANTLY PMN FEW GRAM POSITIVE COCCI    Culture   Final    NO GROWTH < 24 HOURS Performed at [facility], [address] [place], [place] [address]    Report Status PENDING  Incomplete    Radiology Studies: [place] FNA SOFT TISSUE  Result Date: 02/02/2022 INDICATION: 56 year old with osteomyelitis and abscess involving the lumbar paraspinal musculature. EXAM: ULTRASOUND-GUIDED  ASPIRATION OF PARASPINAL ABSCESS MEDICATIONS: Moderate sedation ANESTHESIA/SEDATION: Moderate (conscious) sedation was employed during this procedure. A total of Versed 3.0mg  and fentanyl 200 mcg was administered intravenously at the order of the provider performing the procedure. Total intra-service moderate sedation time: 12 minutes. Patient's level of consciousness and vital signs were monitored continuously by radiology nurse throughout the procedure under the supervision of the provider performing the procedure. COMPLICATIONS: None immediate. PROCEDURE: Informed written consent was obtained from the patient after a thorough discussion of  the procedural risks, benefits and alternatives. All questions were addressed. A timeout was performed prior to the initiation of the procedure. The lower back was evaluated ultrasound with the patient in a prone position. Complex collection in the posterior lower lumbar left paraspinal tissue was identified. This finding corresponded with the area of concern on recent MRI. Skin was prepped with chlorhexidine and sterile field was created. A Yueh catheter was directed into this complex collection with real-time ultrasound guidance. Approximately 6 ml of brownish yellow purulent fluid was aspirated. Majority of the fluid component was aspirated. Bandage placed over the puncture site. FINDINGS: Complex collection in the left lower paraspinal tissue. This area roughly measures 9.5 x 1.8 x 3.2 cm. Small fluid components within this irregular collection. Majority of the fluid was aspirated. 6 mL of purulent fluid was removed. IMPRESSION: Ultrasound-guided aspiration of the left paraspinal abscess. Electronically Signed   By: Richarda Overlie M.D.   On: 02/02/2022 16:08      Catricia Scheerer T. Tyden Kann [facility]  If 7PM-7AM, please contact night-coverage [email or web address] 02/03/2022, 2:20 PM

## 2022-02-03 NOTE — TOC Progression Note (Signed)
Transition of Care [facility]) - Progression Note   Patient Details  Name: Kiondre Grenz MRN: [id number] Date of Birth: 1966-06-30  Transition of Care [facility]) CM/SW Contact  Ewing Schlein, LCSW Phone Number: 02/03/2022, 1:26 PM  Clinical Narrative: TOC received consult for residential substance use programs. CSW spoke with patient regarding residential programs requiring insurance or being private pay. Patient is uninsured and reported he cannot afford to private pay for treatment. CSW added residential treatment and shelter resources to AVS in the event something changes for the patient.  Expected Discharge Plan: Home/Self Care Barriers to Discharge: Continued Medical Work up, Inadequate or no insurance  Expected Discharge Plan and Services Expected Discharge Plan: Home/Self Care In-house Referral: Clinical Social Work Post Acute Care Choice: NA Living arrangements for the past 2 months: Homeless            DME Arranged: N/A DME Agency: NA  Readmission Risk Interventions    01/28/2022   10:36 AM  Readmission Risk Prevention Plan  HRI or Home Care Consult Complete  Social Work Consult for Recovery Care Planning/Counseling Complete  Palliative Care Screening Not Applicable

## 2022-02-03 NOTE — Progress Notes (Signed)
Subjective:  worsening rib pain   Antibiotics:  Anti-infectives (From admission, onward)    Start     Dose/Rate Route Frequency Ordered Stop   01/18/22 0600  ceFAZolin (ANCEF) IVPB 2g/100 mL premix        2 g 200 mL/hr over 30 Minutes Intravenous Every 8 hours 01/18/22 0330     01/18/22 0200  vancomycin (VANCOREADY) IVPB 1250 mg/250 mL  Status:  Discontinued        1,250 mg 166.7 mL/hr over 90 Minutes Intravenous Every 12 hours 01/17/22 1741 01/18/22 0330   01/17/22 2200  ceFEPIme (MAXIPIME) 2 g in sodium chloride 0.9 % 100 mL IVPB  Status:  Discontinued        2 g 200 mL/hr over 30 Minutes Intravenous Every 8 hours 01/17/22 1645 01/18/22 0330   01/17/22 1400  vancomycin (VANCOREADY) IVPB 1500 mg/300 mL        1,500 mg 150 mL/hr over 120 Minutes Intravenous  Once 01/17/22 1323 01/17/22 1629   01/17/22 1330  ceFEPIme (MAXIPIME) 2 g in sodium chloride 0.9 % 100 mL IVPB        2 g 200 mL/hr over 30 Minutes Intravenous  Once 01/17/22 1323 01/17/22 1629       Medications: Scheduled Meds:  amLODipine  5 mg Oral Daily   celecoxib  200 mg Oral BID   cyclobenzaprine  10 mg Oral TID   enoxaparin (LOVENOX) injection  40 mg Subcutaneous Q24H   feeding supplement (GLUCERNA SHAKE)  237 mL Oral TID BM   gabapentin  400 mg Oral TID   insulin aspart  0-15 Units Subcutaneous TID WC   insulin aspart  0-5 Units Subcutaneous QHS   insulin glargine-yfgn  14 Units Subcutaneous Daily   melatonin  5 mg Oral QHS   multivitamin with minerals  1 tablet Oral Daily   nicotine  21 mg Transdermal Daily   pantoprazole  40 mg Oral Daily   pneumococcal 20-valent conjugate vaccine  0.5 mL Intramuscular Tomorrow-1000   Ensure Max Protein  11 oz Oral BID   senna-docusate  1 tablet Oral BID   Continuous Infusions:   ceFAZolin (ANCEF) IV 2 g (02/03/22 0522)   PRN Meds:.acetaminophen **OR** acetaminophen, ALPRAZolam, ondansetron **OR** ondansetron (ZOFRAN) IV, oxyCODONE-acetaminophen,  traMADol    Objective: Weight change:   Intake/Output Summary (Last 24 hours) at 02/03/2022 1055 Last data filed at 02/03/2022 0800 Gross per 24 hour  Intake 1505.27 ml  Output 2200 ml  Net -694.73 ml    Blood pressure 138/83, pulse 71, temperature (!) 97.5 F (36.4 C), temperature source Oral, resp. rate 17, height 6' (1.829 m), weight 77 kg, SpO2 95 %. Temp:  [97.5 F (36.4 C)-98.7 F (37.1 C)] 97.5 F (36.4 C) (06/21 0556) Pulse Rate:  [71-91] 71 (06/21 0556) Resp:  [15-22] 17 (06/21 0556) BP: (114-138)/(65-83) 138/83 (06/21 0556) SpO2:  [95 %-100 %] 95 % (06/21 0556)  Physical Exam: Physical Exam Constitutional:      Appearance: He is well-developed.  HENT:     Head: Normocephalic and atraumatic.  Eyes:     Conjunctiva/sclera: Conjunctivae normal.  Cardiovascular:     Rate and Rhythm: Normal rate and regular rhythm.  Pulmonary:     Effort: Pulmonary effort is normal. No respiratory distress.     Breath sounds: Normal breath sounds. No stridor. No wheezing.  Chest:     Chest wall: No tenderness.  Abdominal:     General: There is  no distension.     Palpations: Abdomen is soft.  Musculoskeletal:        General: Normal range of motion.     Cervical back: Normal range of motion and neck supple.  Skin:    General: Skin is warm and dry.     Findings: No erythema or rash.  Neurological:     General: No focal deficit present.     Mental Status: He is alert and oriented to person, place, and time.  Psychiatric:        Mood and Affect: Mood normal.        Behavior: Behavior normal.        Thought Content: Thought content normal.        Judgment: Judgment normal.      CBC:    BMET Recent Labs    02/03/22 0509  NA 138  K 4.9  CL 99  CO2 33*  GLUCOSE 118*  BUN 31*  CREATININE 0.86  CALCIUM 9.7      Liver Panel  No results for input(s): "PROT", "ALBUMIN", "AST", "ALT", "ALKPHOS", "BILITOT", "BILIDIR", "IBILI" in the last 72  hours.     Sedimentation Rate No results for input(s): "ESRSEDRATE" in the last 72 hours. C-Reactive Protein No results for input(s): "CRP" in the last 72 hours.  Micro Results: Recent Results (from the past 720 hour(s))  Blood culture (routine x 2)     Status: Abnormal   Collection Time: 01/17/22  1:40 PM   Specimen: BLOOD  Result Value Ref Range Status   Specimen Description   Final    BLOOD BLOOD LEFT FOREARM Performed at [facility], [address] [place], [place] [address]    Special Requests   Final    BOTTLES DRAWN AEROBIC AND ANAEROBIC Blood Culture adequate volume Performed at [facility], [address], [place], [place] [address]    Culture  Setup Time   Final    GRAM POSITIVE COCCI ANAEROBIC BOTTLE ONLY IN BOTH AEROBIC AND ANAEROBIC BOTTLES CRITICAL RESULT CALLED TO, READ BACK BY AND VERIFIED WITH: PHARMD MICHELLE LILLISTON 01/18/22@3 :11 BY TW Performed at [facility], [address] [place], [place] [address]    Culture STAPHYLOCOCCUS AUREUS (A)  Final   Report Status 01/20/2022 FINAL  Final   Organism ID, Bacteria STAPHYLOCOCCUS AUREUS  Final      Susceptibility   Staphylococcus aureus - MIC*    CIPROFLOXACIN <=0.5 SENSITIVE Sensitive     ERYTHROMYCIN <=0.25 SENSITIVE Sensitive     GENTAMICIN <=0.5 SENSITIVE Sensitive     OXACILLIN <=0.25 SENSITIVE Sensitive     TETRACYCLINE <=1 SENSITIVE Sensitive     VANCOMYCIN 1 SENSITIVE Sensitive     TRIMETH/SULFA <=10 SENSITIVE Sensitive     CLINDAMYCIN <=0.25 SENSITIVE Sensitive     RIFAMPIN <=0.5 SENSITIVE Sensitive     Inducible Clindamycin NEGATIVE Sensitive     * STAPHYLOCOCCUS AUREUS  Blood Culture ID Panel (Reflexed)     Status: Abnormal   Collection Time: 01/17/22  1:40 PM  Result Value Ref Range Status   Enterococcus faecalis NOT DETECTED NOT DETECTED Final   Enterococcus Faecium NOT DETECTED NOT DETECTED Final   Listeria monocytogenes NOT DETECTED NOT  DETECTED Final   Staphylococcus species DETECTED (A) NOT DETECTED Final    Comment: CRITICAL RESULT CALLED TO, READ BACK BY AND VERIFIED WITH: PHARMD MICHELLE LILLISTON 01/18/22@3 :11 BY TW    Staphylococcus aureus (BCID) DETECTED (A) NOT DETECTED Final  Comment: CRITICAL RESULT CALLED TO, READ BACK BY AND VERIFIED WITH: PHARMD MICHELLE LILLISTON 01/18/22@3 :11 BY TW    Staphylococcus epidermidis NOT DETECTED NOT DETECTED Final   Staphylococcus lugdunensis NOT DETECTED NOT DETECTED Final   Streptococcus species NOT DETECTED NOT DETECTED Final   Streptococcus agalactiae NOT DETECTED NOT DETECTED Final   Streptococcus pneumoniae NOT DETECTED NOT DETECTED Final   Streptococcus pyogenes NOT DETECTED NOT DETECTED Final   A.calcoaceticus-baumannii NOT DETECTED NOT DETECTED Final   Bacteroides fragilis NOT DETECTED NOT DETECTED Final   Enterobacterales NOT DETECTED NOT DETECTED Final   Enterobacter cloacae complex NOT DETECTED NOT DETECTED Final   Escherichia coli NOT DETECTED NOT DETECTED Final   Klebsiella aerogenes NOT DETECTED NOT DETECTED Final   Klebsiella oxytoca NOT DETECTED NOT DETECTED Final   Klebsiella pneumoniae NOT DETECTED NOT DETECTED Final   Proteus species NOT DETECTED NOT DETECTED Final   Salmonella species NOT DETECTED NOT DETECTED Final   Serratia marcescens NOT DETECTED NOT DETECTED Final   Haemophilus influenzae NOT DETECTED NOT DETECTED Final   Neisseria meningitidis NOT DETECTED NOT DETECTED Final   Pseudomonas aeruginosa NOT DETECTED NOT DETECTED Final   Stenotrophomonas maltophilia NOT DETECTED NOT DETECTED Final   Candida albicans NOT DETECTED NOT DETECTED Final   Candida auris NOT DETECTED NOT DETECTED Final   Candida glabrata NOT DETECTED NOT DETECTED Final   Candida krusei NOT DETECTED NOT DETECTED Final   Candida parapsilosis NOT DETECTED NOT DETECTED Final   Candida tropicalis NOT DETECTED NOT DETECTED Final   Cryptococcus neoformans/gattii NOT DETECTED NOT  DETECTED Final   Meth resistant mecA/C and MREJ NOT DETECTED NOT DETECTED Final    Comment: Performed at [facility], [address], [place], [place] [address]  Blood culture (routine x 2)     Status: Abnormal   Collection Time: 01/17/22  1:50 PM   Specimen: BLOOD  Result Value Ref Range Status   Specimen Description   Final    BLOOD BLOOD RIGHT HAND Performed at [place] [address] [place], [place] [address]    Special Requests   Final    BOTTLES DRAWN AEROBIC ONLY Blood Culture adequate volume Performed at [place] [address], [place], [place] [address]    Culture  Setup Time   Final    GRAM POSITIVE COCCI AEROBIC BOTTLE ONLY CRITICAL VALUE NOTED.  VALUE IS CONSISTENT WITH PREVIOUSLY REPORTED AND CALLED VALUE.    Culture (A)  Final    STAPHYLOCOCCUS AUREUS SUSCEPTIBILITIES PERFORMED ON PREVIOUS CULTURE WITHIN THE LAST 5 DAYS. Performed at [facility], [place] [address], [place],  [address]    Report Status 01/20/2022 FINAL  Final  Blood culture (routine x 2)     Status: Abnormal   Collection Time: 01/17/22  2:05 PM   Specimen: BLOOD  Result Value Ref Range Status   Specimen Description   Final    BLOOD RIGHT ANTECUBITAL Performed at [place] [address], [place],  [address]    Special Requests   Final    BOTTLES DRAWN AEROBIC AND ANAEROBIC Blood Culture results may not be optimal due to an excessive volume of blood received in culture bottles Performed at [place] [address] [place],  [address]    Culture  Setup Time   Final    GRAM POSITIVE COCCI AEROBIC BOTTLE ONLY CRITICAL VALUE NOTED.  VALUE IS CONSISTENT WITH PREVIOUSLY REPORTED AND CALLED VALUE. IN  BOTH AEROBIC AND ANAEROBIC BOTTLES    Culture (A)  Final    STAPHYLOCOCCUS AUREUS SUSCEPTIBILITIES PERFORMED ON PREVIOUS CULTURE WITHIN THE LAST 5 DAYS. Performed at [facility], [place] [address], [place], [place] [address]    Report Status 01/20/2022 FINAL  Final  Culture, blood (Routine X 2) w Reflex to ID Panel     Status: None   Collection Time: 01/19/22  9:40 AM   Specimen: BLOOD  Result Value Ref Range Status   Specimen Description   Final    BLOOD RIGHT ANTECUBITAL Performed at [place] [address] [place], [place] [address]    Special Requests   Final    BOTTLES DRAWN AEROBIC AND ANAEROBIC Blood Culture adequate volume Performed at [place] [address] [place], [place] [address]    Culture   Final    NO GROWTH 5 DAYS Performed at [facility], [place] [address], [place], [place] [address]    Report Status 01/24/2022 FINAL  Final  Culture, blood (Routine X 2) w Reflex to ID Panel     Status: None   Collection Time: 01/19/22  9:46 AM   Specimen: BLOOD  Result Value Ref Range Status   Specimen Description   Final    BLOOD BLOOD LEFT FOREARM Performed at [place] [address], [place], [place] [address]    Special Requests   Final    BOTTLES DRAWN AEROBIC AND ANAEROBIC Blood Culture adequate volume Performed at [place] [facility] [address] [place], [place] [address]    Culture   Final    NO GROWTH 5 DAYS Performed at [facility], [place] [address], [place], [place] [address]    Report Status 01/24/2022 FINAL  Final  Aerobic/Anaerobic Culture w Gram Stain (surgical/deep wound)     Status: None (Preliminary result)   Collection Time: 02/02/22  3:06 PM   Specimen: Abscess  Result Value Ref Range Status   Specimen Description   Final    ABSCESS Performed at [place] [address] [place], [place] [address]    Special Requests   Final    NONE Performed at [facility], [place] [address] [place], [place] [address]    Gram Stain   Final    ABUNDANT WBC PRESENT, PREDOMINANTLY PMN FEW  GRAM POSITIVE COCCI    Culture   Final    NO GROWTH < 24 HOURS Performed at [facility], [place] [address] [place], [place] [address]    Report Status PENDING  Incomplete    Studies/Results: [place] FNA SOFT TISSUE  Result Date: 02/02/2022 INDICATION: 56 year old with osteomyelitis and abscess involving the lumbar paraspinal musculature. EXAM: ULTRASOUND-GUIDED ASPIRATION OF PARASPINAL ABSCESS MEDICATIONS: Moderate sedation ANESTHESIA/SEDATION: Moderate (conscious) sedation was employed during this procedure. A total of Versed 3.0mg  and fentanyl 200 mcg was administered intravenously at the order of the provider performing the procedure. Total intra-service moderate sedation time: 12 minutes. Patient's level of consciousness and vital signs were monitored continuously by radiology nurse throughout the procedure under the supervision of the provider performing the procedure. COMPLICATIONS: None immediate. PROCEDURE: Informed written consent was obtained from the patient after a thorough discussion of the procedural risks, benefits and alternatives. All questions were addressed. A timeout was performed prior to the initiation of the procedure. The lower back was evaluated ultrasound with the patient in a prone position. Complex collection in the  posterior lower lumbar left paraspinal tissue was identified. This finding corresponded with the area of concern on recent MRI. Skin was prepped with chlorhexidine and sterile field was created. A Yueh catheter was directed into this complex collection with real-time ultrasound guidance. Approximately 6 ml of brownish yellow purulent fluid was aspirated. Majority of the fluid component was aspirated. Bandage placed over the puncture site. FINDINGS: Complex collection in the left lower paraspinal tissue. This area roughly measures 9.5 x 1.8 x 3.2 cm. Small fluid components within this irregular collection. Majority of the fluid was aspirated. 6 mL of purulent fluid  was removed. IMPRESSION: Ultrasound-guided aspiration of the left paraspinal abscess. Electronically Signed   By: Richarda Overlie M.D.   On: 02/02/2022 16:08      Assessment/Plan:  INTERVAL HISTORY:   Patient is status post IR guided aspiration of his abscess  Principal Problem:   MSSA bacteremia Active Problems:   Tobacco abuse   Septic arthritis of lumbar spine (HCC)   Controlled type 2 diabetes mellitus without complication, without long-term current use of insulin (HCC)   Hypocalcemia   Normocytic anemia   Hyponatremia   Essential hypertension   Hypomagnesemia   IVDU (intravenous drug user)   Osteomyelitis of lumbar spine (HCC)   Chronic pain   Insomnia   Left erector spinae muscle abscess   Constipation   Infective endocarditis of tricuspid valve    Ragnar Davia is a 56 y.o. male with history of injection drug use with MSSA bacteremia, tricuspid valve endocarditis and septic embolization to the lungs facet septic arthritis with abscess abscess in the erector spinae muscles.  He is now status post repeat MRI of the lumbar spine and pelvis.  There is evidence of progressive L4 and L5 facet pedicle osteomyelitis from earlier in the month with septic facet joint and confluent left lateral epidural inflammation from L4-S1 but without a discrete epidural abscess.  There is ongoing erector spinae inflammatory myositis as well as an abscess at L4-L5 with a volume of 15 cm.   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  Greatly appreciate radiology aspirating purulent area in in the erector spinae muscles  Continue cefazolin while in house  He will need protracted therapy eventually with oral antibiotics.  I have some concern about the epidural inflammation in L4-S1 and would recommend  repeating an MRI in 2 to 4 weeks to reassess this area  #2 IVDU: This precludes conventional treatment of his endocarditis and facet septic arthritis vertebral osteomyelitis with prolonged IV antibiotics outside of the hospital.  It sounds like he is largely been using methamphetamines   ]#3 Low energy and his concern that he has low testosterone levels .  Labs in the years pending.  #4 rib pain expect this is due to pleurisy from septic embolization to the lungs.  I will order 2 view chest x-ray. And it may be prudent to get CT chest with contrast to assess for potential complications related to this such as a lung abscess.    I spent 36 minutes with the patient including than 50% of the time in face to face counseling of the patient has metastatic MSSA infection,  along with review of medical records in preparation for the visit and during the visit and in coordination of his care.     LOS: 17 days  Acey Lav 02/03/2022, 10:55 AM

## 2022-02-04 ENCOUNTER — Inpatient Hospital Stay (HOSPITAL_COMMUNITY): Payer: Self-pay

## 2022-02-04 DIAGNOSIS — J189 Pneumonia, unspecified organism: Secondary | ICD-10-CM

## 2022-02-04 DIAGNOSIS — R0781 Pleurodynia: Secondary | ICD-10-CM

## 2022-02-04 LAB — GLUCOSE, CAPILLARY
Glucose-Capillary: 277 mg/dL — ABNORMAL HIGH (ref 70–99)
Glucose-Capillary: 260 mg/dL — ABNORMAL HIGH (ref 70–99)
Glucose-Capillary: 167 mg/dL — ABNORMAL HIGH (ref 70–99)
Glucose-Capillary: 139 mg/dL — ABNORMAL HIGH (ref 70–99)

## 2022-02-04 LAB — LUTEINIZING HORMONE: LH: 7.5 m[IU]/mL (ref 1.7–8.6)

## 2022-02-04 LAB — FOLLICLE STIMULATING HORMONE: FSH: 8.4 m[IU]/mL (ref 1.5–12.4)

## 2022-02-04 MED ORDER — IOHEXOL 300 MG/ML  SOLN
75.0000 mL | Freq: Once | INTRAMUSCULAR | Status: AC | PRN
  Administered 2022-02-04: 75 mL via INTRAVENOUS

## 2022-02-04 MED ORDER — INSULIN ASPART 100 UNIT/ML IJ SOLN
7.0000 [IU] | Freq: Three times a day (TID) | INTRAMUSCULAR | Status: DC
  Administered 2022-02-04 – 2022-02-18 (×38): 7 [IU] via SUBCUTANEOUS

## 2022-02-04 MED ORDER — INSULIN GLARGINE-YFGN 100 UNIT/ML ~~LOC~~ SOLN
20.0000 [IU] | Freq: Every day | SUBCUTANEOUS | Status: DC
  Administered 2022-02-04: 20 [IU] via SUBCUTANEOUS
  Filled 2022-02-04: qty 0.2

## 2022-02-04 MED ORDER — INSULIN GLARGINE-YFGN 100 UNIT/ML ~~LOC~~ SOLN
15.0000 [IU] | Freq: Two times a day (BID) | SUBCUTANEOUS | Status: DC
  Administered 2022-02-04 – 2022-02-07 (×6): 15 [IU] via SUBCUTANEOUS
  Filled 2022-02-04 (×6): qty 0.15

## 2022-02-04 MED ORDER — POLYETHYLENE GLYCOL 3350 17 G PO PACK: 17.0000 g | PACK | Freq: Two times a day (BID) | ORAL | Status: DC | PRN

## 2022-02-04 MED ORDER — BISACODYL 10 MG RE SUPP
10.0000 mg | Freq: Every day | RECTAL | Status: DC | PRN
Start: 2022-02-04 — End: 2022-02-20
  Administered 2022-02-04 – 2022-02-09 (×3): 10 mg via RECTAL
  Filled 2022-02-04 (×4): qty 1

## 2022-02-04 MED ORDER — FLEET ENEMA 7-19 GM/118ML RE ENEM
1.0000 | ENEMA | Freq: Every day | RECTAL | Status: DC | PRN
Start: 2022-02-04 — End: 2022-02-20
  Administered 2022-02-04 – 2022-02-09 (×3): 1 via RECTAL
  Filled 2022-02-04 (×5): qty 1

## 2022-02-04 NOTE — Progress Notes (Signed)
PROGRESS NOTE  Tanner [name] RXV:[id number] DOB: 1966-04-25   PCP: Patient, No Pcp Per  Patient is from: Home  DOA: 01/17/2022 LOS: 18  Chief complaints Chief Complaint  Patient presents with   Back Pain     Brief Narrative / Interim history: 56 year old M with PMH of DM-2, HTN, polysubstance use including IVDU with crystal meth, tobacco use disorder and hepatitis C presenting with back pain and found to have MSSA bacteremia with tricuspid valve endocarditis, septic emboli to the lungs, septic arthritis/osteomyelitis of L4-5 facet, epidural inflammation in L4-S1 as well as left erector spinae muscle abscess.  He underwent US-guided drainage of left erector spinae abscess on 02/02/2022.  Remains on IV Ancef per infectious disease.  Subjective: Seen and examined earlier this morning.  No major events overnight of this morning.  Continues to report bilateral rib pain.  Pain is worse with movement.  Denies shortness of breath or cough.  CT chest with smaller focal airspace opacities in RUL, RLL and LUL concerning for multifocal pneumonia.  Objective: Vitals:   02/03/22 0556 02/03/22 1345 02/03/22 1900 02/03/22 1932  BP: 138/83 125/75 137/79   Pulse: 71 77 84   Resp: 17 18  20   Temp: (!) 97.5 F (36.4 C) 98.6 F (37 C) 98.2 F (36.8 C)   TempSrc: Oral  Oral   SpO2: 95% 98% 98%   Weight:      Height:        Examination:  GENERAL: No apparent distress.  Nontoxic. HEENT: MMM.  Vision and hearing grossly intact.  NECK: Supple.  No apparent JVD.  RESP:  No IWOB.  Fair aeration bilaterally. CVS:  RRR. Heart sounds normal.  ABD/GI/GU: BS+. Abd soft, NTND.  MSK/EXT:  Moves extremities. No apparent deformity. No edema.  SKIN: no apparent skin lesion or wound NEURO: Awake and alert. Oriented appropriately.  No apparent focal neuro deficit. PSYCH: Calm. Normal affect.   Procedures:  6/20-US guided aspiration of left erector spinae muscle abscess  Microbiology summarized: 6/4-blood  culture with MSSA. 6/6-blood culture NGTD  Assessment and plan: Principal Problem:   MSSA bacteremia Active Problems:   Septic arthritis of lumbar spine (HCC)   IVDU (intravenous drug user)   Osteomyelitis of lumbar spine (HCC)   Left erector spinae muscle abscess   Infective endocarditis of tricuspid valve   Chronic pain   Insomnia   Controlled type 2 diabetes mellitus without complication, without long-term current use of insulin (HCC)   Essential hypertension   Constipation   Hypocalcemia   Hyponatremia   Hypomagnesemia   Tobacco abuse   Normocytic anemia  MSSA bacteremia/infective TV endocarditis/septic emboli to the lung/L4-5 septic arthritis and osteomyelitis/epidural inflammation/left erector spinae muscle abscess -History of IVDU with crystal meth -6/4 blood culture MSSA -6/6 blood culture NGTD -TTE with TV vegetation.  TEE was not needed -MRI T-spine without acute finding -CT chest concerning for multifocal pneumonia. -On IV Ancef per ID -Repeat MRI in 2 to 4 weeks to reassess L4-S1 epidural inflammation   IVDU -Excludes him from home IV antibiotics  Chronic pain/bilateral chest pain: Could be due to septic emboli but pain is worse with movement suggesting musculoskeletal etiology. -Encourage ambulation in the hallway -Continue Flexeril, tramadol, Percocet, Celexa and gabapentin     Insomnia -Continue current regimen.   Uncontrolled NIDDM-2 with hyperglycemia: A1c 10.8% on 6/5. Recent Labs  Lab 02/03/22 1130 02/03/22 1639 02/03/22 2105 02/04/22 0742 02/04/22 1158  GLUCAP 233* 303* 279* 277* 260*  -Continue SSI-moderate -Increase NovoLog  from 5 to 7 units 3 times daily with meals -Increase Semglee to 15 units twice daily starting tonight.  He received 20 units earlier this morning.   Essential hypertension: Normotensive. -Continue low-dose amlodipine   Constipation: Reports normal bowel movement on 6/20 -MiraLAX, Senokot-S and Fleet enema as  needed.   Hypocalcemia/Hyponatremia/Hypomagnesemia: Resolved.  Tobacco use disorder -Patient was counseled to quit. -Nicotine patch   Normocytic anemia: H&H relatively stable. Recent Labs    10/21/21 1153 01/17/22 0722 01/18/22 0331 01/19/22 0939 01/28/22 0446 01/29/22 0419 02/03/22 0509  HGB 13.6 11.1* 11.4* 11.8* 10.3* 11.3* 10.4*  -Continue monitoring  Increased nutrient needs Body mass index is 23.02 kg/m. Nutrition Problem: Increased nutrient needs Etiology: acute illness (polysubstance abuse) Signs/Symptoms: estimated needs Interventions: Ensure Enlive (each supplement provides 350kcal and 20 grams of protein), MVI, Liberalize Diet   DVT prophylaxis:  enoxaparin (LOVENOX) injection 40 mg Start: 02/03/22 1000 SCDs Start: 01/17/22 1405  Code Status: Full code Family Communication: None at bedside Level of care: Med-Surg Status is: Inpatient Remains inpatient appropriate because: Due to IV antibiotics   Final disposition: TBD Consultants:  Infectious disease Interventional radiology  Sch Meds:  Scheduled Meds:  amLODipine  5 mg Oral Daily   celecoxib  200 mg Oral BID   cyclobenzaprine  10 mg Oral TID   enoxaparin (LOVENOX) injection  40 mg Subcutaneous Q24H   feeding supplement (GLUCERNA SHAKE)  237 mL Oral TID BM   gabapentin  400 mg Oral TID   insulin aspart  0-15 Units Subcutaneous TID WC   insulin aspart  0-5 Units Subcutaneous QHS   insulin aspart  7 Units Subcutaneous TID WC   insulin glargine-yfgn  20 Units Subcutaneous Daily   melatonin  5 mg Oral QHS   multivitamin with minerals  1 tablet Oral Daily   nicotine  21 mg Transdermal Daily   pantoprazole  40 mg Oral Daily   pneumococcal 20-valent conjugate vaccine  0.5 mL Intramuscular Tomorrow-1000   Ensure Max Protein  11 oz Oral BID   senna-docusate  1 tablet Oral BID   Continuous Infusions:   ceFAZolin (ANCEF) IV 2 g (02/04/22 0505)   PRN Meds:.acetaminophen **OR** acetaminophen,  ALPRAZolam, bisacodyl, ondansetron **OR** ondansetron (ZOFRAN) IV, oxyCODONE-acetaminophen, sodium phosphate, traMADol  Antimicrobials: Anti-infectives (From admission, onward)    Start     Dose/Rate Route Frequency Ordered Stop   01/18/22 0600  ceFAZolin (ANCEF) IVPB 2g/100 mL premix        2 g 200 mL/hr over 30 Minutes Intravenous Every 8 hours 01/18/22 0330     01/18/22 0200  vancomycin (VANCOREADY) IVPB 1250 mg/250 mL  Status:  Discontinued        1,250 mg 166.7 mL/hr over 90 Minutes Intravenous Every 12 hours 01/17/22 1741 01/18/22 0330   01/17/22 2200  ceFEPIme (MAXIPIME) 2 g in sodium chloride 0.9 % 100 mL IVPB  Status:  Discontinued        2 g 200 mL/hr over 30 Minutes Intravenous Every 8 hours 01/17/22 1645 01/18/22 0330   01/17/22 1400  vancomycin (VANCOREADY) IVPB 1500 mg/300 mL        1,500 mg 150 mL/hr over 120 Minutes Intravenous  Once 01/17/22 1323 01/17/22 1629   01/17/22 1330  ceFEPIme (MAXIPIME) 2 g in sodium chloride 0.9 % 100 mL IVPB        2 g 200 mL/hr over 30 Minutes Intravenous  Once 01/17/22 1323 01/17/22 1629        I have personally reviewed  the following labs and images: CBC: Recent Labs  Lab 01/29/22 0419 02/03/22 0509  WBC 8.9 5.5  NEUTROABS 5.8  --   HGB 11.3* 10.4*  HCT 34.6* 32.6*  MCV 83.6 84.7  PLT 514* 358   BMP &GFR Recent Labs  Lab 01/29/22 0419 02/03/22 0509  NA  --  138  K  --  4.9  CL  --  99  CO2  --  33*  GLUCOSE  --  118*  BUN  --  31*  CREATININE  --  0.86  CALCIUM  --  9.7  MG 1.8  --    Estimated Creatinine Clearance: 103.2 mL/min (by C-G formula based on SCr of 0.86 mg/dL). Liver & Pancreas: No results for input(s): "AST", "ALT", "ALKPHOS", "BILITOT", "PROT", "ALBUMIN" in the last 168 hours. No results for input(s): "LIPASE", "AMYLASE" in the last 168 hours. No results for input(s): "AMMONIA" in the last 168 hours. Diabetic: No results for input(s): "HGBA1C" in the last 72 hours. Recent Labs  Lab  02/03/22 1130 02/03/22 1639 02/03/22 2105 02/04/22 0742 02/04/22 1158  GLUCAP 233* 303* 279* 277* 260*   Cardiac Enzymes: No results for input(s): "CKTOTAL", "CKMB", "CKMBINDEX", "TROPONINI" in the last 168 hours. No results for input(s): "PROBNP" in the last 8760 hours. Coagulation Profile: No results for input(s): "INR", "PROTIME" in the last 168 hours. Thyroid Function Tests: No results for input(s): "TSH", "T4TOTAL", "FREET4", "T3FREE", "THYROIDAB" in the last 72 hours. Lipid Profile: No results for input(s): "CHOL", "HDL", "LDLCALC", "TRIG", "CHOLHDL", "LDLDIRECT" in the last 72 hours. Anemia Panel: No results for input(s): "VITAMINB12", "FOLATE", "FERRITIN", "TIBC", "IRON", "RETICCTPCT" in the last 72 hours. Urine analysis:    Component Value Date/Time   COLORURINE YELLOW 08/21/2019 1812   APPEARANCEUR CLEAR 08/21/2019 1812   LABSPEC 1.025 08/21/2019 1812   PHURINE 6.5 08/21/2019 1812   GLUCOSEU 250 (A) 08/21/2019 1812   HGBUR NEGATIVE 08/21/2019 1812   BILIRUBINUR LARGE (A) 08/21/2019 1812   KETONESUR 15 (A) 08/21/2019 1812   PROTEINUR NEGATIVE 08/21/2019 1812   NITRITE NEGATIVE 08/21/2019 1812   LEUKOCYTESUR NEGATIVE 08/21/2019 1812   Sepsis Labs: Invalid input(s): "PROCALCITONIN", "LACTICIDVEN"  Microbiology: Recent Results (from the past 240 hour(s))  Aerobic/Anaerobic Culture w Gram Stain (surgical/deep wound)     Status: None (Preliminary result)   Collection Time: 02/02/22  3:06 PM   Specimen: Abscess  Result Value Ref Range Status   Specimen Description   Final    ABSCESS Performed at [facility], [address], [place], [place] [address]    Special Requests   Final    NONE Performed at [facility], [address], [place], [place] [address]    Gram Stain   Final    ABUNDANT WBC PRESENT, PREDOMINANTLY PMN FEW GRAM POSITIVE COCCI    Culture   Final    RARE STAPHYLOCOCCUS AUREUS CULTURE REINCUBATED FOR  BETTER GROWTH Performed at [facility], [address] [place], [place] [address]    Report Status PENDING  Incomplete    Radiology Studies: CT CHEST W CONTRAST  Result Date: 02/04/2022 CLINICAL DATA:  Rib pain. EXAM: CT CHEST WITH CONTRAST TECHNIQUE: Multidetector CT imaging of the chest was performed during intravenous contrast administration. RADIATION DOSE REDUCTION: This exam was performed according to the departmental dose-optimization program which includes automated exposure control, adjustment of the mA and/or kV according to patient size and/or use of iterative reconstruction technique. CONTRAST:  37mL OMNIPAQUE IOHEXOL 300 MG/ML  SOLN COMPARISON:  None Available. FINDINGS: Cardiovascular: No significant vascular findings. Normal heart size. No pericardial effusion. Mediastinum/Nodes: No enlarged mediastinal, hilar, or axillary lymph nodes. Thyroid gland, trachea, and esophagus demonstrate no significant findings. Lungs/Pleura: No pneumothorax is noted. Minimal left pleural effusion is noted with adjacent left lower lobe pneumonia or atelectasis. Focal air space opacity is noted medially in the right upper lobe concerning for focal infiltrate or scarring. Similar opacities are noted posteriorly in the right lower lobe and lingular segment of left upper lobe. Upper Abdomen: No acute abnormality. Musculoskeletal: No chest wall abnormality. No acute or significant osseous findings. IMPRESSION: Minimal left pleural effusion is noted with adjacent left lower lobe pneumonia or atelectasis. Smaller focal airspace opacities are noted in the right upper, right lower and left upper lobes concerning for possible multifocal pneumonia. Electronically Signed   By: Marijo Conception M.D.   On: 02/04/2022 13:39   DG Chest 2 View  Result Date: 02/03/2022 CLINICAL DATA:  Pleuritic chest pain.  Smoker.  Hypertension. EXAM: CHEST - 2 VIEW COMPARISON:  04/25/2012 FINDINGS: The heart size and mediastinal  contours are within normal limits. Blunting of the left costophrenic angle is identified, new from previous exam. Scar like density noted within the left lower lobe along the left heart border. No airspace consolidation. The visualized skeletal structures are unremarkable. IMPRESSION: Blunting of the left costophrenic angle may represent a small pleural effusion, pleuroparenchymal scarring or atelectasis. Electronically Signed   By: Kerby Moors M.D.   On: 02/03/2022 14:40      Dexton Zwilling [address] [place]  If 7PM-7AM, please contact night-coverage [email or web address] 02/04/2022, 1:46 PM

## 2022-02-05 DIAGNOSIS — M4620 Osteomyelitis of vertebra, site unspecified: Secondary | ICD-10-CM

## 2022-02-05 DIAGNOSIS — M545 Low back pain, unspecified: Secondary | ICD-10-CM

## 2022-02-05 DIAGNOSIS — M869 Osteomyelitis, unspecified: Secondary | ICD-10-CM

## 2022-02-05 DIAGNOSIS — G062 Extradural and subdural abscess, unspecified: Secondary | ICD-10-CM

## 2022-02-05 DIAGNOSIS — J90 Pleural effusion, not elsewhere classified: Secondary | ICD-10-CM

## 2022-02-05 LAB — CBC WITH DIFFERENTIAL/PLATELET
Abs Immature Granulocytes: 0.03 10*3/uL (ref 0.00–0.07)
Basophils Absolute: 0 10*3/uL (ref 0.0–0.1)
Basophils Relative: 1 %
Eosinophils Absolute: 0.2 10*3/uL (ref 0.0–0.5)
Eosinophils Relative: 3 %
HCT: 32.5 % — ABNORMAL LOW (ref 39.0–52.0)
Hemoglobin: 10.6 g/dL — ABNORMAL LOW (ref 13.0–17.0)
Immature Granulocytes: 1 %
Lymphocytes Relative: 35 %
Lymphs Abs: 1.8 10*3/uL (ref 0.7–4.0)
MCH: 27.7 pg (ref 26.0–34.0)
MCHC: 32.6 g/dL (ref 30.0–36.0)
MCV: 84.9 fL (ref 80.0–100.0)
Monocytes Absolute: 0.6 10*3/uL (ref 0.1–1.0)
Monocytes Relative: 11 %
Neutro Abs: 2.7 10*3/uL (ref 1.7–7.7)
Neutrophils Relative %: 49 %
Platelets: 328 10*3/uL (ref 150–400)
RBC: 3.83 MIL/uL — ABNORMAL LOW (ref 4.22–5.81)
RDW: 13.4 % (ref 11.5–15.5)
WBC: 5.3 10*3/uL (ref 4.0–10.5)
nRBC: 0 % (ref 0.0–0.2)

## 2022-02-05 LAB — RENAL FUNCTION PANEL
Albumin: 2.9 g/dL — ABNORMAL LOW (ref 3.5–5.0)
Anion gap: 7 (ref 5–15)
BUN: 23 mg/dL — ABNORMAL HIGH (ref 6–20)
CO2: 31 mmol/L (ref 22–32)
Calcium: 9.5 mg/dL (ref 8.9–10.3)
Chloride: 98 mmol/L (ref 98–111)
Creatinine, Ser: 0.73 mg/dL (ref 0.61–1.24)
GFR, Estimated: >60 mL/min (ref >60)
Glucose, Bld: 129 mg/dL — ABNORMAL HIGH (ref 70–99)
Phosphorus: 4.7 mg/dL — ABNORMAL HIGH (ref 2.5–4.6)
Potassium: 4.3 mmol/L (ref 3.5–5.1)
Sodium: 136 mmol/L (ref 135–145)

## 2022-02-05 LAB — GLUCOSE, CAPILLARY
Glucose-Capillary: 156 mg/dL — ABNORMAL HIGH (ref 70–99)
Glucose-Capillary: 86 mg/dL (ref 70–99)
Glucose-Capillary: 211 mg/dL — ABNORMAL HIGH (ref 70–99)
Glucose-Capillary: 173 mg/dL — ABNORMAL HIGH (ref 70–99)

## 2022-02-05 LAB — MAGNESIUM: Magnesium: 1.7 mg/dL (ref 1.7–2.4)

## 2022-02-05 NOTE — Progress Notes (Signed)
Subjective:  No new complaints   Antibiotics:  Anti-infectives (From admission, onward)    Start     Dose/Rate Route Frequency Ordered Stop   01/18/22 0600  ceFAZolin (ANCEF) IVPB 2g/100 mL premix        2 g 200 mL/hr over 30 Minutes Intravenous Every 8 hours 01/18/22 0330     01/18/22 0200  vancomycin (VANCOREADY) IVPB 1250 mg/250 mL  Status:  Discontinued        1,250 mg 166.7 mL/hr over 90 Minutes Intravenous Every 12 hours 01/17/22 1741 01/18/22 0330   01/17/22 2200  ceFEPIme (MAXIPIME) 2 g in sodium chloride 0.9 % 100 mL IVPB  Status:  Discontinued        2 g 200 mL/hr over 30 Minutes Intravenous Every 8 hours 01/17/22 1645 01/18/22 0330   01/17/22 1400  vancomycin (VANCOREADY) IVPB 1500 mg/300 mL        1,500 mg 150 mL/hr over 120 Minutes Intravenous  Once 01/17/22 1323 01/17/22 1629   01/17/22 1330  ceFEPIme (MAXIPIME) 2 g in sodium chloride 0.9 % 100 mL IVPB        2 g 200 mL/hr over 30 Minutes Intravenous  Once 01/17/22 1323 01/17/22 1629       Medications: Scheduled Meds:  amLODipine  5 mg Oral Daily   celecoxib  200 mg Oral BID   cyclobenzaprine  10 mg Oral TID   enoxaparin (LOVENOX) injection  40 mg Subcutaneous Q24H   feeding supplement (GLUCERNA SHAKE)  237 mL Oral TID BM   gabapentin  400 mg Oral TID   insulin aspart  0-15 Units Subcutaneous TID WC   insulin aspart  0-5 Units Subcutaneous QHS   insulin aspart  7 Units Subcutaneous TID WC   insulin glargine-yfgn  15 Units Subcutaneous BID   melatonin  5 mg Oral QHS   multivitamin with minerals  1 tablet Oral Daily   nicotine  21 mg Transdermal Daily   pantoprazole  40 mg Oral Daily   pneumococcal 20-valent conjugate vaccine  0.5 mL Intramuscular Tomorrow-1000   Ensure Max Protein  11 oz Oral BID   senna-docusate  1 tablet Oral BID   Continuous Infusions:   ceFAZolin (ANCEF) IV 2 g (02/05/22 0611)   PRN Meds:.acetaminophen **OR** acetaminophen, ALPRAZolam, bisacodyl, ondansetron **OR**  ondansetron (ZOFRAN) IV, oxyCODONE-acetaminophen, polyethylene glycol, sodium phosphate, traMADol    Objective: Weight change:   Intake/Output Summary (Last 24 hours) at 02/05/2022 1216 Last data filed at 02/05/2022 0830 Gross per 24 hour  Intake 1400.15 ml  Output 2900 ml  Net -1499.85 ml    Blood pressure 136/82, pulse 70, temperature 97.7 F (36.5 C), resp. rate 18, height 6' (1.829 m), weight 77 kg, SpO2 99 %. Temp:  [97.7 F (36.5 C)-98 F (36.7 C)] 97.7 F (36.5 C) (06/23 0530) Pulse Rate:  [70-83] 70 (06/23 0530) Resp:  [16-18] 18 (06/23 0530) BP: (119-136)/(64-82) 136/82 (06/23 0530) SpO2:  [97 %-99 %] 99 % (06/23 0530)  Physical Exam: Physical Exam Constitutional:      Appearance: He is well-developed.  HENT:     Head: Normocephalic and atraumatic.  Eyes:     Conjunctiva/sclera: Conjunctivae normal.  Cardiovascular:     Rate and Rhythm: Normal rate and regular rhythm.  Pulmonary:     Effort: Pulmonary effort is normal. No respiratory distress.     Breath sounds: Normal breath sounds. No wheezing.  Abdominal:     General: There is no  distension.     Palpations: Abdomen is soft.  Musculoskeletal:        General: Normal range of motion.     Cervical back: Normal range of motion and neck supple.  Skin:    General: Skin is warm and dry.     Findings: No erythema or rash.  Neurological:     General: No focal deficit present.     Mental Status: He is alert and oriented to person, place, and time.  Psychiatric:        Mood and Affect: Mood normal.        Behavior: Behavior normal.        Thought Content: Thought content normal.        Judgment: Judgment normal.      CBC:    BMET Recent Labs    02/03/22 0509 02/05/22 0501  NA 138 136  K 4.9 4.3  CL 99 98  CO2 33* 31  GLUCOSE 118* 129*  BUN 31* 23*  CREATININE 0.86 0.73  CALCIUM 9.7 9.5      Liver Panel  Recent Labs    02/05/22 0501  ALBUMIN 2.9*       Sedimentation Rate No  results for input(s): "ESRSEDRATE" in the last 72 hours. C-Reactive Protein No results for input(s): "CRP" in the last 72 hours.  Micro Results: Recent Results (from the past 720 hour(s))  Blood culture (routine x 2)     Status: Abnormal   Collection Time: 01/17/22  1:40 PM   Specimen: BLOOD  Result Value Ref Range Status   Specimen Description   Final    BLOOD BLOOD LEFT FOREARM Performed at [facility], [address], [place], [place] [address]    Special Requests   Final    BOTTLES DRAWN AEROBIC AND ANAEROBIC Blood Culture adequate volume Performed at [facility], [address], [place], [place] [address]    Culture  Setup Time   Final    GRAM POSITIVE COCCI ANAEROBIC BOTTLE ONLY IN BOTH AEROBIC AND ANAEROBIC BOTTLES CRITICAL RESULT CALLED TO, READ BACK BY AND VERIFIED WITH: PHARMD MICHELLE LILLISTON 01/18/22@3 :11 BY TW Performed at [facility], [address], [place], [place] [address]    Culture STAPHYLOCOCCUS AUREUS (A)  Final   Report Status 01/20/2022 FINAL  Final   Organism ID, Bacteria STAPHYLOCOCCUS AUREUS  Final      Susceptibility   Staphylococcus aureus - MIC*    CIPROFLOXACIN <=0.5 SENSITIVE Sensitive     ERYTHROMYCIN <=0.25 SENSITIVE Sensitive     GENTAMICIN <=0.5 SENSITIVE Sensitive     OXACILLIN <=0.25 SENSITIVE Sensitive     TETRACYCLINE <=1 SENSITIVE Sensitive     VANCOMYCIN 1 SENSITIVE Sensitive     TRIMETH/SULFA <=10 SENSITIVE Sensitive     CLINDAMYCIN <=0.25 SENSITIVE Sensitive     RIFAMPIN <=0.5 SENSITIVE Sensitive     Inducible Clindamycin NEGATIVE Sensitive     * STAPHYLOCOCCUS AUREUS  Blood Culture ID Panel (Reflexed)     Status: Abnormal   Collection Time: 01/17/22  1:40 PM  Result Value Ref Range Status   Enterococcus faecalis NOT DETECTED NOT DETECTED Final   Enterococcus Faecium NOT DETECTED NOT DETECTED Final   Listeria monocytogenes NOT DETECTED NOT DETECTED Final   Staphylococcus species  DETECTED (A) NOT DETECTED Final    Comment: CRITICAL RESULT CALLED TO, READ BACK BY AND VERIFIED WITH: PHARMD MICHELLE LILLISTON 01/18/22@3 :11 BY TW    Staphylococcus aureus (BCID) DETECTED (A) NOT DETECTED  Final    Comment: CRITICAL RESULT CALLED TO, READ BACK BY AND VERIFIED WITH: PHARMD MICHELLE LILLISTON 01/18/22@3 :11 BY TW    Staphylococcus epidermidis NOT DETECTED NOT DETECTED Final   Staphylococcus lugdunensis NOT DETECTED NOT DETECTED Final   Streptococcus species NOT DETECTED NOT DETECTED Final   Streptococcus agalactiae NOT DETECTED NOT DETECTED Final   Streptococcus pneumoniae NOT DETECTED NOT DETECTED Final   Streptococcus pyogenes NOT DETECTED NOT DETECTED Final   A.calcoaceticus-baumannii NOT DETECTED NOT DETECTED Final   Bacteroides fragilis NOT DETECTED NOT DETECTED Final   Enterobacterales NOT DETECTED NOT DETECTED Final   Enterobacter cloacae complex NOT DETECTED NOT DETECTED Final   Escherichia coli NOT DETECTED NOT DETECTED Final   Klebsiella aerogenes NOT DETECTED NOT DETECTED Final   Klebsiella oxytoca NOT DETECTED NOT DETECTED Final   Klebsiella pneumoniae NOT DETECTED NOT DETECTED Final   Proteus species NOT DETECTED NOT DETECTED Final   Salmonella species NOT DETECTED NOT DETECTED Final   Serratia marcescens NOT DETECTED NOT DETECTED Final   Haemophilus influenzae NOT DETECTED NOT DETECTED Final   Neisseria meningitidis NOT DETECTED NOT DETECTED Final   Pseudomonas aeruginosa NOT DETECTED NOT DETECTED Final   Stenotrophomonas maltophilia NOT DETECTED NOT DETECTED Final   Candida albicans NOT DETECTED NOT DETECTED Final   Candida auris NOT DETECTED NOT DETECTED Final   Candida glabrata NOT DETECTED NOT DETECTED Final   Candida krusei NOT DETECTED NOT DETECTED Final   Candida parapsilosis NOT DETECTED NOT DETECTED Final   Candida tropicalis NOT DETECTED NOT DETECTED Final   Cryptococcus neoformans/gattii NOT DETECTED NOT DETECTED Final   Meth resistant mecA/C  and MREJ NOT DETECTED NOT DETECTED Final    Comment: Performed at [facility], [address] [place], [place] [address]  Blood culture (routine x 2)     Status: Abnormal   Collection Time: 01/17/22  1:50 PM   Specimen: BLOOD  Result Value Ref Range Status   Specimen Description   Final    BLOOD BLOOD RIGHT HAND Performed at [facility], [address], [place], [place] [address]    Special Requests   Final    BOTTLES DRAWN AEROBIC ONLY Blood Culture adequate volume Performed at [facility], [address], [place], [place] [address]    Culture  Setup Time   Final    GRAM POSITIVE COCCI AEROBIC BOTTLE ONLY CRITICAL VALUE NOTED.  VALUE IS CONSISTENT WITH PREVIOUSLY REPORTED AND CALLED VALUE.    Culture (A)  Final    STAPHYLOCOCCUS AUREUS SUSCEPTIBILITIES PERFORMED ON PREVIOUS CULTURE WITHIN THE LAST 5 DAYS. Performed at [facility], [address] [place], [place] [address]    Report Status 01/20/2022 FINAL  Final  Blood culture (routine x 2)     Status: Abnormal   Collection Time: 01/17/22  2:05 PM   Specimen: BLOOD  Result Value Ref Range Status   Specimen Description   Final    BLOOD RIGHT ANTECUBITAL Performed at [facility], [address], [place], [place] [address]    Special Requests   Final    BOTTLES DRAWN AEROBIC AND ANAEROBIC Blood Culture results may not be optimal due to an excessive volume of blood received in culture bottles Performed at [facility], [address] [place], [place] [address]    Culture  Setup Time   Final    GRAM POSITIVE COCCI AEROBIC BOTTLE ONLY CRITICAL VALUE NOTED.  VALUE IS CONSISTENT WITH PREVIOUSLY REPORTED  AND CALLED VALUE. IN BOTH AEROBIC AND ANAEROBIC BOTTLES    Culture (A)  Final    STAPHYLOCOCCUS AUREUS SUSCEPTIBILITIES PERFORMED ON PREVIOUS CULTURE WITHIN THE LAST 5 DAYS. Performed at [facility], [address],  [place], [place] [address]    Report Status 01/20/2022 FINAL  Final  Culture, blood (Routine X 2) w Reflex to ID Panel     Status: None   Collection Time: 01/19/22  9:40 AM   Specimen: BLOOD  Result Value Ref Range Status   Specimen Description   Final    BLOOD RIGHT ANTECUBITAL Performed at [facility], [address] [place], [place] [address]    Special Requests   Final    BOTTLES DRAWN AEROBIC AND ANAEROBIC Blood Culture adequate volume Performed at [facility], [address], [place], [place] [address]    Culture   Final    NO GROWTH 5 DAYS Performed at [facility], [address] [place], [place] [address]    Report Status 01/24/2022 FINAL  Final  Culture, blood (Routine X 2) w Reflex to ID Panel     Status: None   Collection Time: 01/19/22  9:46 AM   Specimen: BLOOD  Result Value Ref Range Status   Specimen Description   Final    BLOOD BLOOD LEFT FOREARM Performed at [facility], [address] [place], [place] [address]    Special Requests   Final    BOTTLES DRAWN AEROBIC AND ANAEROBIC Blood Culture adequate volume Performed at [facility], [address] [place], [place] [address]    Culture   Final    NO GROWTH 5 DAYS Performed at [facility], [address], [place], [place] [address]    Report Status 01/24/2022 FINAL  Final  Aerobic/Anaerobic Culture w Gram Stain (surgical/deep wound)     Status: None (Preliminary result)   Collection Time: 02/02/22  3:06 PM   Specimen: Abscess  Result Value Ref Range Status   Specimen Description   Final    ABSCESS Performed at [facility], [address], [place], [place] [address]    Special Requests   Final    NONE Performed at [facility], [address] [place], [place] [address]    Gram Stain   Final    ABUNDANT WBC PRESENT, PREDOMINANTLY PMN FEW GRAM POSITIVE COCCI    Culture    Final    FEW STAPHYLOCOCCUS AUREUS SUSCEPTIBILITIES TO FOLLOW Performed at [facility], [address] [place], [place] [address]    Report Status PENDING  Incomplete    Studies/Results: CT CHEST W CONTRAST  Result Date: 02/04/2022 CLINICAL DATA:  Rib pain. EXAM: CT CHEST WITH CONTRAST TECHNIQUE: Multidetector CT imaging of the chest was performed during intravenous contrast administration. RADIATION DOSE REDUCTION: This exam was performed according to the departmental dose-optimization program which includes automated exposure control, adjustment of the mA and/or kV according to patient size and/or use of iterative reconstruction technique. CONTRAST:  75mL OMNIPAQUE IOHEXOL 300 MG/ML  SOLN COMPARISON:  None Available. FINDINGS: Cardiovascular: No significant vascular findings. Normal heart size. No pericardial effusion. Mediastinum/Nodes: No enlarged mediastinal, hilar, or axillary lymph nodes. Thyroid gland, trachea, and esophagus demonstrate no significant findings. Lungs/Pleura: No pneumothorax is noted. Minimal left pleural effusion is noted with adjacent left lower lobe pneumonia or atelectasis. Focal air space opacity is noted medially in the right upper lobe concerning  for focal infiltrate or scarring. Similar opacities are noted posteriorly in the right lower lobe and lingular segment of left upper lobe. Upper Abdomen: No acute abnormality. Musculoskeletal: No chest wall abnormality. No acute or significant osseous findings. IMPRESSION: Minimal left pleural effusion is noted with adjacent left lower lobe pneumonia or atelectasis. Smaller focal airspace opacities are noted in the right upper, right lower and left upper lobes concerning for possible multifocal pneumonia. Electronically Signed   By: Lupita Raider M.D.   On: 02/04/2022 13:39   DG Chest 2 View  Result Date: 02/03/2022 CLINICAL DATA:  Pleuritic chest pain.  Smoker.  Hypertension. EXAM: CHEST - 2 VIEW COMPARISON:   04/25/2012 FINDINGS: The heart size and mediastinal contours are within normal limits. Blunting of the left costophrenic angle is identified, new from previous exam. Scar like density noted within the left lower lobe along the left heart border. No airspace consolidation. The visualized skeletal structures are unremarkable. IMPRESSION: Blunting of the left costophrenic angle may represent a small pleural effusion, pleuroparenchymal scarring or atelectasis. Electronically Signed   By: Signa Kell M.D.   On: 02/03/2022 14:40      Assessment/Plan:  INTERVAL HISTORY:   CT lungs completed continues show left pleural effusion  Principal Problem:   MSSA bacteremia Active Problems:   Tobacco abuse   Septic arthritis of lumbar spine (HCC)   Controlled type 2 diabetes mellitus without complication, without long-term current use of insulin (HCC)   Hypocalcemia   Normocytic anemia   Hyponatremia   Essential hypertension   Hypomagnesemia   IVDU (intravenous drug user)   Osteomyelitis of lumbar spine (HCC)   Chronic pain   Insomnia   Left erector spinae muscle abscess   Constipation   Infective endocarditis of tricuspid valve    Karsyn Stiltz is a 56 y.o. male with history of injection drug use with MSSA bacteremia, tricuspid valve endocarditis and septic embolization to the lungs facet septic arthritis with abscess abscess in the erector spinae muscles.  He is now status post repeat MRI of the lumbar spine and pelvis.  There is evidence of progressive L4 and L5 facet pedicle osteomyelitis from earlier in the month with septic facet joint and confluent left lateral epidural inflammation from L4-S1 but without a discrete epidural abscess.  There is ongoing erector spinae inflammatory myositis as well as an abscess at L4-L5 with a volume of 15 cm.   #1  Metastatic MSSA infection with tricuspid valve endocarditis septic embolization to the lungs septic facet arthritis epidural inflammation  concerning for early epidural abscess and now persistent pyomyositis with erector spinae abscess.  Greatly appreciate radiology aspirating purulent area in in the erector spinae muscles  Continue cefazolin for now.  We discussed options for try to complete 6 weeks of IV therapy in the hospital versus some portion of IV therapy followed by a dose of oritavancin and oral therapy.  I do have concerns about the area ofepidural inflammation in L4-S1 and would recommend repeating an MRI in of this area. I would want to repeat imaging in 2 to 4 weeks but his dispo may affect this i.e. if he wants to leave next week it may be prudent to reimage him prior to leaving esp if arranging MRI as an outpatient since he has NO insurance  #2 IVDU: This precludes conventional treatment of his endocarditis and facet septic arthritis vertebral osteomyelitis with prolonged IV antibiotics outside of the hospital.  It sounds like he is largely been using  methamphetamines   ]#3 Low energy and his concern that he has low testosterone levels .  Labs in the years pending.  #4 rib pain expect this is due to pleurisy    #5 Pleural effusion: small   Dr Elinor Parkinson is available for questions this weekend and Dr. Earlene Plater will be here on Monday.      LOS: 19 days   Acey Lav 02/05/2022, 12:16 PM

## 2022-02-06 LAB — GLUCOSE, CAPILLARY
Glucose-Capillary: 173 mg/dL — ABNORMAL HIGH (ref 70–99)
Glucose-Capillary: 169 mg/dL — ABNORMAL HIGH (ref 70–99)
Glucose-Capillary: 119 mg/dL — ABNORMAL HIGH (ref 70–99)
Glucose-Capillary: 111 mg/dL — ABNORMAL HIGH (ref 70–99)

## 2022-02-06 LAB — TESTOSTERONE, FREE: Testosterone, Free: 3.3 pg/mL — ABNORMAL LOW (ref 7.2–24.0)

## 2022-02-07 LAB — GLUCOSE, CAPILLARY
Glucose-Capillary: 230 mg/dL — ABNORMAL HIGH (ref 70–99)
Glucose-Capillary: 76 mg/dL (ref 70–99)
Glucose-Capillary: 186 mg/dL — ABNORMAL HIGH (ref 70–99)
Glucose-Capillary: 140 mg/dL — ABNORMAL HIGH (ref 70–99)

## 2022-02-07 LAB — AEROBIC/ANAEROBIC CULTURE W GRAM STAIN (SURGICAL/DEEP WOUND)

## 2022-02-07 MED ORDER — INSULIN GLARGINE-YFGN 100 UNIT/ML ~~LOC~~ SOLN
20.0000 [IU] | Freq: Two times a day (BID) | SUBCUTANEOUS | Status: DC
  Administered 2022-02-07 – 2022-02-14 (×14): 20 [IU] via SUBCUTANEOUS
  Filled 2022-02-07 (×15): qty 0.2

## 2022-02-08 DIAGNOSIS — E119 Type 2 diabetes mellitus without complications: Secondary | ICD-10-CM

## 2022-02-08 LAB — GLUCOSE, CAPILLARY
Glucose-Capillary: 214 mg/dL — ABNORMAL HIGH (ref 70–99)
Glucose-Capillary: 102 mg/dL — ABNORMAL HIGH (ref 70–99)
Glucose-Capillary: 254 mg/dL — ABNORMAL HIGH (ref 70–99)
Glucose-Capillary: 109 mg/dL — ABNORMAL HIGH (ref 70–99)

## 2022-02-08 MED ORDER — GLUCERNA SHAKE PO LIQD
237.0000 mL | Freq: Two times a day (BID) | ORAL | Status: DC
  Administered 2022-02-08 – 2022-02-17 (×14): 237 mL via ORAL
  Filled 2022-02-08 (×20): qty 237

## 2022-02-08 NOTE — Progress Notes (Signed)
Nutrition Follow-up  DOCUMENTATION CODES:   Not applicable  INTERVENTION:  - continue Ensure Max BID. - will decrease Glucerna Shake from TID to BID. - continue daily snack. - will order for 1 cup chocolate ice cream/day. - provided patient with Enhanced Menu for meals and put a star next to options that he would be able to order on Carb Modified diet.  - weigh patient today.    NUTRITION DIAGNOSIS:   Increased nutrient needs related to acute illness (polysubstance abuse) as evidenced by estimated needs. -ongoing  GOAL:   Patient will meet greater than or equal to 90% of their needs -met on average  MONITOR:   PO intake, Supplement acceptance, Labs, Weight trends  ASSESSMENT:   56 year old male, single, independent, currently unemployed, medical history significant for polysubstance abuse (tobacco, IVDA-crystal meth), type II DM, HTN, hepatitis C, cellulitis of right foot, opioid dependence, periodontal disease presented to the ED for the second time in a week with complaints of progressively worsening low back pain x2 weeks.  MRI L-spine confirms L4-5 facet septic arthritis with associated osteomyelitis and multiloculated left erector spinae muscle abscess (12 mL).  Patient requires 6 weeks of admission d/t IV abx need.   Patient sitting up in the chair. He shares frustration about current diet order and some misunderstanding about what foods he can and cannot have, especially between meals.   He has been accepting Glucerna Shake 90% of the time offered and Ensure Max 75% of the time offered. Discussed swapping one bottle of Glucerna Shake for a cup of chocolate ice cream daily.  Flow sheet documentation indicates 100% completion of breakfast and lunch yesterday and 100% of breakfast today.   He has not been weighed since admission on 6/4. No information documented in the edema section of flow sheet this admission.   Labs reviewed; CBGs: 214 and 102 mg/dl, BUN: 23 mg/dl,  Phos: 4.7 mg/dl.  Medications reviewed; sliding scale novolog, 7 units novolog TID, 20 units semglee BID, 5 mg melatonin/night, 40 mg oral protonix/day, 1 tablet senokot BID.    Diet Order:   Diet Order             Diet Carb Modified Fluid consistency: Thin; Room service appropriate? Yes with Assist  Diet effective now                   EDUCATION NEEDS:   Education needs have been addressed  Skin:  Skin Assessment: Skin Integrity Issues: Skin Integrity Issues:: Other (Comment) Other: cellulitis of right foot  Last BM:  6/26 (type 1)  Height:   Ht Readings from Last 1 Encounters:  01/17/22 6' (1.829 m)    Weight:   Wt Readings from Last 1 Encounters:  01/17/22 77 kg     BMI:  Body mass index is 23.02 kg/m.  Estimated Nutritional Needs:  Kcal:  2300-2500 Protein:  115-130g Fluid:  2.3L/day      Tanner Gammon, MS, RD, LDN, CNSC Registered Dietitian II Inpatient Clinical Nutrition RD pager # and on-call/weekend pager # available in [facility]

## 2022-02-09 LAB — GLUCOSE, CAPILLARY
Glucose-Capillary: 106 mg/dL — ABNORMAL HIGH (ref 70–99)
Glucose-Capillary: 296 mg/dL — ABNORMAL HIGH (ref 70–99)
Glucose-Capillary: 195 mg/dL — ABNORMAL HIGH (ref 70–99)
Glucose-Capillary: 213 mg/dL — ABNORMAL HIGH (ref 70–99)

## 2022-02-09 MED ORDER — BENZOCAINE 10 % MT GEL
Freq: Three times a day (TID) | OROMUCOSAL | Status: DC | PRN
  Filled 2022-02-09 (×2): qty 9.4

## 2022-02-10 ENCOUNTER — Inpatient Hospital Stay (HOSPITAL_COMMUNITY): Payer: Self-pay

## 2022-02-10 DIAGNOSIS — F22 Delusional disorders: Secondary | ICD-10-CM

## 2022-02-10 LAB — RAPID URINE DRUG SCREEN, HOSP PERFORMED
Amphetamines: NOT DETECTED
Barbiturates: NOT DETECTED
Benzodiazepines: NOT DETECTED
Cocaine: NOT DETECTED
Opiates: NOT DETECTED
Tetrahydrocannabinol: NOT DETECTED

## 2022-02-10 LAB — GLUCOSE, CAPILLARY
Glucose-Capillary: 131 mg/dL — ABNORMAL HIGH (ref 70–99)
Glucose-Capillary: 101 mg/dL — ABNORMAL HIGH (ref 70–99)
Glucose-Capillary: 278 mg/dL — ABNORMAL HIGH (ref 70–99)
Glucose-Capillary: 194 mg/dL — ABNORMAL HIGH (ref 70–99)

## 2022-02-10 MED ORDER — NALOXEGOL OXALATE 25 MG PO TABS
25.0000 mg | ORAL_TABLET | Freq: Every day | ORAL | Status: DC
Start: 2022-02-10 — End: 2022-02-20
  Administered 2022-02-10 – 2022-02-19 (×10): 25 mg via ORAL
  Filled 2022-02-10 (×10): qty 1

## 2022-02-10 NOTE — Progress Notes (Signed)
PROGRESS NOTE  Tanner [name] UTM:[id number] DOB: 13-Sep-1965   PCP: Patient, No Pcp Per  Patient is from: Home  DOA: 01/17/2022 LOS: 24  Chief complaints Chief Complaint  Patient presents with   Back Pain     Brief Narrative / Interim history: 56 year old M with PMH of DM-2, HTN, polysubstance use including IVDU with crystal meth, tobacco use disorder and hepatitis C presenting with back pain and found to have MSSA bacteremia with tricuspid valve endocarditis, septic emboli to the lungs, septic arthritis/osteomyelitis of L4-5 facet, epidural inflammation in L4-S1 as well as left erector spinae muscle abscess.  He underwent US-guided drainage of left erector spinae abscess on 02/02/2022.  ID recommends antibiotics for 6 weeks preferably with IV Ancef versus IV followed by oritavancin and oral therapy.   Subjective: Seen and examined earlier this morning.  Patient walked out of the hospital and was found outside smoking.  He reported not thinking clearly.  He apologized for his oxygen and return to the room.  He was advised to use the cold room and notify staff before taking second such action.  This morning, he reports constipation.  He says he had a string of stool when he had a BM yesterday.  He is asking if UFO has placed some tracking device in his rectum when he saw them few days ago.  He says he believes in [place].  He says he has a video of them on his phone when he saw them but does not have his phone with him now.  He also thinks someone might be tracking him when he moved from [place] to [place].  He does not feel he is in danger now.  He denies other visual hallucination.  Objective: Vitals:   02/09/22 1313 02/09/22 2040 02/10/22 0511 02/10/22 1321  BP: (!) 141/78 (!) 148/98 124/70 (!) 147/84  Pulse: 92 94 79 90  Resp: 18 18 17 18   Temp: 98.1 F (36.7 C) 98 F (36.7 C) 97.9 F (36.6 C) 98.4 F (36.9 C)  TempSrc:  Oral Oral Oral  SpO2: 97% 98% 96% 97%  Weight:      Height:         Examination:   GENERAL: No apparent distress.  Nontoxic. HEENT: MMM.  Vision and hearing grossly intact.  NECK: Supple.  No apparent JVD.  RESP:  No IWOB.  Fair aeration bilaterally. CVS:  RRR. Heart sounds normal.  ABD/GI/GU: BS+. Abd soft, NTND.  MSK/EXT:  Moves extremities. No apparent deformity. No edema.  SKIN: no apparent skin lesion or wound NEURO: Awake and alert. Oriented appropriately.  No apparent focal neuro deficit. PSYCH: Calm. Normal affect.   Procedures:  6/20-US guided aspiration of left erector spinae muscle abscess  Microbiology summarized: 6/4-blood culture with MSSA. 6/6-blood culture NGTD  Assessment and plan: Principal Problem:   MSSA bacteremia Active Problems:   Septic arthritis of lumbar spine (HCC)   IVDU (intravenous drug user)   Osteomyelitis of lumbar spine (HCC)   Left erector spinae muscle abscess   Infective endocarditis of tricuspid valve   Chronic pain   Insomnia   Controlled type 2 diabetes mellitus without complication, without long-term current use of insulin (HCC)   Essential hypertension   Constipation   Hypocalcemia   Hyponatremia   Hypomagnesemia   Tobacco abuse   Normocytic anemia   Acute low back pain   Osteomyelitis (HCC)   Paraspinal abscess (HCC)  MSSA bacteremia/infective TV endocarditis/septic emboli to the lung/L4-5 septic arthritis and osteomyelitis/epidural inflammation/left  erector spinae muscle abscess IVDU with crystal meth-excludes him from home IV antibiotic infusion -Initial blood culture on 6/4 positive for MSSA.  Repeat blood culture on 6/6 negative. -TTE with TV vegetation.  TEE was not needed -MRI T-spine without acute finding -Repeat lumbar MRI on 6/18 with progressive left L4 and L5 facet and pedicle osteo, confluent left lateral epidural inflammation from L4-S1 without epidural abscess and ongoing left 15 mm erector spinae intramuscular abscess -CT chest concerning for multifocal pneumonia. -ID  recs: Antibiotics for 6 weeks preferably with IV Ancef versus IV followed by oritavancin and oral therapy.  -Repeat MRI lumbar spine in 2 to 4 weeks to reassess L4-S1 epidural inflammation -Glycemic control.  Chronic pain/bilateral chest pain: Looks like musculoskeletal but no clear explanation clinically and radiologically. -Encourage ambulation in the hallway -Continue Flexeril, tramadol, Percocet, Celexa and gabapentin -Transition to Suboxone when he is close to discharge  Delusion/insomnia-on 6/28, patient asked if UFO has placed some tracking device in his rectum when he saw them few days ago.  He says he believes in [place].  He says he has a video of them on his phone when he saw them but does not have his phone with him now.  He also thinks someone might be tracking him when he moved from [place] to [place].  He does not feel he is in danger now.  He denies other visual hallucination.  Patient elected out of his room the night before and was found smoking cigarettes outside the hospital. -Check UDS -Continue current regimen.   Uncontrolled NIDDM-2 with hyperglycemia: A1c 10.8% on 6/5. Recent Labs  Lab 02/09/22 1125 02/09/22 1631 02/09/22 2316 02/10/22 0801 02/10/22 1150  GLUCAP 296* 195* 213* 131* 101*  -Continue SSI-moderate -Continue NovoLog 7 units 3 times daily with meals -Continue Semglee at 20 units twice daily -Further adjustment as appropriate.   Essential hypertension: Normotensive. -Continue low-dose amlodipine   Constipation: KUB with moderate to large colonic stool burden.  Likely from pain meds. -Continue MiraLAX and Senokot-S -Added Movantik   Hypocalcemia/Hyponatremia/Hypomagnesemia: Resolved.  Tobacco use disorder -Patient was counseled to quit. -Nicotine patch   Normocytic anemia: H&H relatively stable. Recent Labs    10/21/21 1153 01/17/22 0722 01/18/22 0331 01/19/22 0939 01/28/22 0446 01/29/22 0419 02/03/22 0509 02/05/22 0501  HGB 13.6 11.1*  11.4* 11.8* 10.3* 11.3* 10.4* 10.6*  -Continue monitoring  Increased nutrient needs Body mass index is 24.52 kg/m. Nutrition Problem: Increased nutrient needs Etiology: acute illness (polysubstance abuse) Signs/Symptoms: estimated needs Interventions: Glucerna shake, Snacks, Premier Protein   DVT prophylaxis:  enoxaparin (LOVENOX) injection 40 mg Start: 02/03/22 1000 SCDs Start: 01/17/22 1405  Code Status: Full code Family Communication: None at bedside Level of care: Med-Surg Status is: Inpatient Remains inpatient appropriate because: Due to IV antibiotics for MSSA bacteremia and osteomyelitis.  Not a candidate for home IV infusion.   Final disposition: TBD Consultants:  Infectious disease Interventional radiology  Sch Meds:  Scheduled Meds:  amLODipine  5 mg Oral Daily   celecoxib  200 mg Oral BID   cyclobenzaprine  10 mg Oral TID   enoxaparin (LOVENOX) injection  40 mg Subcutaneous Q24H   feeding supplement (GLUCERNA SHAKE)  237 mL Oral BID BM   gabapentin  400 mg Oral TID   insulin aspart  0-15 Units Subcutaneous TID WC   insulin aspart  0-5 Units Subcutaneous QHS   insulin aspart  7 Units Subcutaneous TID WC   insulin glargine-yfgn  20 Units Subcutaneous BID   melatonin  5 mg Oral QHS   multivitamin with minerals  1 tablet Oral Daily   naloxegol oxalate  25 mg Oral Daily   nicotine  21 mg Transdermal Daily   pantoprazole  40 mg Oral Daily   pneumococcal 20-valent conjugate vaccine  0.5 mL Intramuscular Tomorrow-1000   Ensure Max Protein  11 oz Oral BID   senna-docusate  1 tablet Oral BID   Continuous Infusions:   ceFAZolin (ANCEF) IV 2 g (02/10/22 1500)   PRN Meds:.acetaminophen **OR** acetaminophen, ALPRAZolam, benzocaine, bisacodyl, ondansetron **OR** ondansetron (ZOFRAN) IV, oxyCODONE-acetaminophen, polyethylene glycol, sodium phosphate, traMADol  Antimicrobials: Anti-infectives (From admission, onward)    Start     Dose/Rate Route Frequency Ordered  Stop   01/18/22 0600  ceFAZolin (ANCEF) IVPB 2g/100 mL premix        2 g 200 mL/hr over 30 Minutes Intravenous Every 8 hours 01/18/22 0330     01/18/22 0200  vancomycin (VANCOREADY) IVPB 1250 mg/250 mL  Status:  Discontinued        1,250 mg 166.7 mL/hr over 90 Minutes Intravenous Every 12 hours 01/17/22 1741 01/18/22 0330   01/17/22 2200  ceFEPIme (MAXIPIME) 2 g in sodium chloride 0.9 % 100 mL IVPB  Status:  Discontinued        2 g 200 mL/hr over 30 Minutes Intravenous Every 8 hours 01/17/22 1645 01/18/22 0330   01/17/22 1400  vancomycin (VANCOREADY) IVPB 1500 mg/300 mL        1,500 mg 150 mL/hr over 120 Minutes Intravenous  Once 01/17/22 1323 01/17/22 1629   01/17/22 1330  ceFEPIme (MAXIPIME) 2 g in sodium chloride 0.9 % 100 mL IVPB        2 g 200 mL/hr over 30 Minutes Intravenous  Once 01/17/22 1323 01/17/22 1629        I have personally reviewed the following labs and images: CBC: Recent Labs  Lab 02/05/22 0501  WBC 5.3  NEUTROABS 2.7  HGB 10.6*  HCT 32.5*  MCV 84.9  PLT 328   BMP &GFR Recent Labs  Lab 02/05/22 0501  NA 136  K 4.3  CL 98  CO2 31  GLUCOSE 129*  BUN 23*  CREATININE 0.73  CALCIUM 9.5  MG 1.7  PHOS 4.7*   Estimated Creatinine Clearance: 111.8 mL/min (by C-G formula based on SCr of 0.73 mg/dL). Liver & Pancreas: Recent Labs  Lab 02/05/22 0501  ALBUMIN 2.9*   No results for input(s): "LIPASE", "AMYLASE" in the last 168 hours. No results for input(s): "AMMONIA" in the last 168 hours. Diabetic: No results for input(s): "HGBA1C" in the last 72 hours. Recent Labs  Lab 02/09/22 1125 02/09/22 1631 02/09/22 2316 02/10/22 0801 02/10/22 1150  GLUCAP 296* 195* 213* 131* 101*   Cardiac Enzymes: No results for input(s): "CKTOTAL", "CKMB", "CKMBINDEX", "TROPONINI" in the last 168 hours. No results for input(s): "PROBNP" in the last 8760 hours. Coagulation Profile: No results for input(s): "INR", "PROTIME" in the last 168 hours. Thyroid  Function Tests: No results for input(s): "TSH", "T4TOTAL", "FREET4", "T3FREE", "THYROIDAB" in the last 72 hours. Lipid Profile: No results for input(s): "CHOL", "HDL", "LDLCALC", "TRIG", "CHOLHDL", "LDLDIRECT" in the last 72 hours. Anemia Panel: No results for input(s): "VITAMINB12", "FOLATE", "FERRITIN", "TIBC", "IRON", "RETICCTPCT" in the last 72 hours. Urine analysis:    Component Value Date/Time   COLORURINE YELLOW 08/21/2019 1812   APPEARANCEUR CLEAR 08/21/2019 1812   LABSPEC 1.025 08/21/2019 1812   PHURINE 6.5 08/21/2019 1812   GLUCOSEU 250 (A) 08/21/2019 1812  HGBUR NEGATIVE 08/21/2019 1812   BILIRUBINUR LARGE (A) 08/21/2019 1812   KETONESUR 15 (A) 08/21/2019 1812   PROTEINUR NEGATIVE 08/21/2019 1812   NITRITE NEGATIVE 08/21/2019 1812   LEUKOCYTESUR NEGATIVE 08/21/2019 1812   Sepsis Labs: Invalid input(s): "PROCALCITONIN", "LACTICIDVEN"  Microbiology: Recent Results (from the past 240 hour(s))  Aerobic/Anaerobic Culture w Gram Stain (surgical/deep wound)     Status: None   Collection Time: 02/02/22  3:06 PM   Specimen: Abscess  Result Value Ref Range Status   Specimen Description   Final    ABSCESS Performed at [facility], [address], [place], [place] [address]    Special Requests   Final    NONE Performed at [facility], [address] [place], [place] [address]    Gram Stain   Final    ABUNDANT WBC PRESENT, PREDOMINANTLY PMN FEW GRAM POSITIVE COCCI    Culture   Final    FEW STAPHYLOCOCCUS AUREUS NO ANAEROBES ISOLATED Performed at [facility], [address] [place], [place] [address]    Report Status 02/07/2022 FINAL  Final   Organism ID, Bacteria STAPHYLOCOCCUS AUREUS  Final      Susceptibility   Staphylococcus aureus - MIC*    CIPROFLOXACIN <=0.5 SENSITIVE Sensitive     ERYTHROMYCIN <=0.25 SENSITIVE Sensitive     GENTAMICIN <=0.5 SENSITIVE Sensitive     OXACILLIN <=0.25 SENSITIVE Sensitive      TETRACYCLINE <=1 SENSITIVE Sensitive     VANCOMYCIN 1 SENSITIVE Sensitive     TRIMETH/SULFA <=10 SENSITIVE Sensitive     CLINDAMYCIN <=0.25 SENSITIVE Sensitive     RIFAMPIN <=0.5 SENSITIVE Sensitive     Inducible Clindamycin NEGATIVE Sensitive     * FEW STAPHYLOCOCCUS AUREUS    Radiology Studies: DG Abd Portable 1V  Result Date: 02/10/2022 CLINICAL DATA:  Constipation EXAM: PORTABLE ABDOMEN - 1 VIEW COMPARISON:  None Available. FINDINGS: No evidence of bowel obstruction. There is a moderate to large colonic stool burden, most concentrated in the ascending and transverse colon. No acute osseous abnormality. There are few punctate metallic debris overlying the abdomen, unclear if internal or external, not seen on prior CT in January 2021. IMPRESSION: Moderate to large colonic stool burden most concentrated in the ascending and transverse colon. No evidence of bowel obstruction. Electronically Signed   By: Caprice Renshaw M.D.   On: 02/10/2022 09:48      Amyah Clawson T. Lugene Beougher [facility]  If 7PM-7AM, please contact night-coverage [email or web address] 02/10/2022, 3:29 PM

## 2022-02-10 NOTE — Progress Notes (Signed)
Patient reported that he had a hard time having a bowel movement today he asking for enema, Rn brought the enema and patient verbalized that he meant to ask for a suppository, enema was returned to the pyxis and Dulcolax supp. was given instead patient stated that he will put it him self.

## 2022-02-10 NOTE — Progress Notes (Signed)
    OVERNIGHT PROGRESS REPORT Notified by RN for patient leaving the floor. Staff notified security and [facility].  Patient was found outside by those notified and informed by that if he left without permission or supervision again he will be discharged from care.  Patient was congenial and acknowledged this, returned to room without incident where he remains at this time.   Chinita Greenland MSNA MSN ACNPC-AG Acute Care Nurse Practitioner [facility]

## 2022-02-10 NOTE — Progress Notes (Signed)
Rn was called in again in the patient room, now patient is asking for the enema as he explains that the suppository didn't work, enema bottle given. Patient was told and instructed not to strain too much and call for help he needs to, patient initiated that he can do the enema himself . We will continue to monitor.

## 2022-02-10 NOTE — Progress Notes (Signed)
CN came and notify me that patient went on his way to the elevator told the CN that patient can't leave the floor. CN called security and AC.According to the [facility] patient was found outside the hospital smoking and was accompanied back to his room. Upon entering the room patient was asked why he left the floor, patient responded " I just need  a breather aI need some fresh air". Patient was educated that he cannot leave the floor unless their is a doctor's order he was also instructed to use his call light to let staff know if he plans to do so patient responded " I wasn't thinking clear anymore I didn't think of calling I just felt like I will pass out if don't go out and get fresh air, I'm sorry if I put you in trouble." Patient was reassured that it is more safer for him to be in his room if he feels like passing out.We will notify on call provider.

## 2022-02-11 LAB — COMPREHENSIVE METABOLIC PANEL
ALT: 9 U/L (ref 0–44)
AST: 14 U/L — ABNORMAL LOW (ref 15–41)
Albumin: 2.9 g/dL — ABNORMAL LOW (ref 3.5–5.0)
Alkaline Phosphatase: 94 U/L (ref 38–126)
Anion gap: 6 (ref 5–15)
BUN: 26 mg/dL — ABNORMAL HIGH (ref 6–20)
CO2: 29 mmol/L (ref 22–32)
Calcium: 9.3 mg/dL (ref 8.9–10.3)
Chloride: 104 mmol/L (ref 98–111)
Creatinine, Ser: 0.91 mg/dL (ref 0.61–1.24)
GFR, Estimated: >60 mL/min (ref >60)
Glucose, Bld: 125 mg/dL — ABNORMAL HIGH (ref 70–99)
Potassium: 4.3 mmol/L (ref 3.5–5.1)
Sodium: 139 mmol/L (ref 135–145)
Total Bilirubin: 0.2 mg/dL — ABNORMAL LOW (ref 0.3–1.2)
Total Protein: 7.8 g/dL (ref 6.5–8.1)

## 2022-02-11 LAB — GLUCOSE, CAPILLARY
Glucose-Capillary: 145 mg/dL — ABNORMAL HIGH (ref 70–99)
Glucose-Capillary: 114 mg/dL — ABNORMAL HIGH (ref 70–99)
Glucose-Capillary: 126 mg/dL — ABNORMAL HIGH (ref 70–99)
Glucose-Capillary: 182 mg/dL — ABNORMAL HIGH (ref 70–99)

## 2022-02-11 LAB — PHOSPHORUS: Phosphorus: 4.8 mg/dL — ABNORMAL HIGH (ref 2.5–4.6)

## 2022-02-11 LAB — CBC WITH DIFFERENTIAL/PLATELET
Abs Immature Granulocytes: 0.07 10*3/uL (ref 0.00–0.07)
Basophils Absolute: 0 10*3/uL (ref 0.0–0.1)
Basophils Relative: 1 %
Eosinophils Absolute: 0.3 10*3/uL (ref 0.0–0.5)
Eosinophils Relative: 6 %
HCT: 30.8 % — ABNORMAL LOW (ref 39.0–52.0)
Hemoglobin: 10 g/dL — ABNORMAL LOW (ref 13.0–17.0)
Immature Granulocytes: 1 %
Lymphocytes Relative: 34 %
Lymphs Abs: 1.7 10*3/uL (ref 0.7–4.0)
MCH: 27.4 pg (ref 26.0–34.0)
MCHC: 32.5 g/dL (ref 30.0–36.0)
MCV: 84.4 fL (ref 80.0–100.0)
Monocytes Absolute: 0.5 10*3/uL (ref 0.1–1.0)
Monocytes Relative: 10 %
Neutro Abs: 2.3 10*3/uL (ref 1.7–7.7)
Neutrophils Relative %: 48 %
Platelets: 258 10*3/uL (ref 150–400)
RBC: 3.65 MIL/uL — ABNORMAL LOW (ref 4.22–5.81)
RDW: 13.6 % (ref 11.5–15.5)
WBC: 4.9 10*3/uL (ref 4.0–10.5)
nRBC: 0 % (ref 0.0–0.2)

## 2022-02-11 LAB — SEDIMENTATION RATE: Sed Rate: 105 mm/hr — ABNORMAL HIGH (ref 0–16)

## 2022-02-11 LAB — MAGNESIUM: Magnesium: 1.8 mg/dL (ref 1.7–2.4)

## 2022-02-11 LAB — C-REACTIVE PROTEIN: CRP: 3.6 mg/dL — ABNORMAL HIGH (ref <1.0)

## 2022-02-11 NOTE — Progress Notes (Signed)
PROGRESS NOTE  Tanner [name] AJG:[id number] DOB: August 25, 1965   PCP: Patient, No Pcp Per  Patient is from: Home  DOA: 01/17/2022 LOS: 25  Chief complaints Chief Complaint  Patient presents with   Back Pain     Brief Narrative / Interim history: 56 year old M with PMH of DM-2, HTN, polysubstance use including IVDU with crystal meth, tobacco use disorder and hepatitis C presenting with back pain and found to have MSSA bacteremia with tricuspid valve endocarditis, septic emboli to the lungs, septic arthritis/osteomyelitis of L4-5 facet, epidural inflammation in L4-S1 as well as left erector spinae muscle abscess.  He underwent US-guided drainage of left erector spinae abscess on 02/02/2022.  ID recommends antibiotics for 6 weeks preferably with IV Ancef versus IV followed by oritavancin and oral therapy.   Subjective: Seen and examined earlier this morning.  No major events overnight of this morning.  Had bowel movements yesterday.  No complaints but he reported sharp chest pain later in the day.  EKG was basically normal.  Objective: Vitals:   02/10/22 0511 02/10/22 1321 02/10/22 2117 02/11/22 0543  BP: 124/70 (!) 147/84 (!) 146/86 131/79  Pulse: 79 90 80 77  Resp: 17 18 18 18   Temp: 97.9 F (36.6 C) 98.4 F (36.9 C) 98.1 F (36.7 C) (!) 97.5 F (36.4 C)  TempSrc: Oral Oral Oral Oral  SpO2: 96% 97% 100% 98%  Weight:      Height:        Examination:  GENERAL: No apparent distress.  Nontoxic. HEENT: MMM.  Vision and hearing grossly intact.  NECK: Supple.  No apparent JVD.  RESP:  No IWOB.  Fair aeration bilaterally. CVS:  RRR. Heart sounds normal.  ABD/GI/GU: BS+. Abd soft, NTND.  MSK/EXT:  Moves extremities. No apparent deformity. No edema.  SKIN: no apparent skin lesion or wound NEURO: Awake and alert. Oriented appropriately.  No apparent focal neuro deficit. PSYCH: Calm. Normal affect.   Procedures:  6/20-US guided aspiration of left erector spinae muscle  abscess  Microbiology summarized: 6/4-blood culture with MSSA. 6/6-blood culture NGTD  Assessment and plan: Principal Problem:   MSSA bacteremia Active Problems:   Septic arthritis of lumbar spine (HCC)   IVDU (intravenous drug user)   Osteomyelitis of lumbar spine (HCC)   Left erector spinae muscle abscess   Infective endocarditis of tricuspid valve   Chronic pain   Insomnia   Controlled type 2 diabetes mellitus without complication, without long-term current use of insulin (HCC)   Essential hypertension   Constipation   Hypocalcemia   Hyponatremia   Hypomagnesemia   Tobacco abuse   Normocytic anemia   Acute low back pain   Osteomyelitis (HCC)   Paraspinal abscess (HCC)   Delusion (HCC)  MSSA bacteremia/infective TV endocarditis/septic emboli to the lung/L4-5 septic arthritis and osteomyelitis/epidural inflammation/left erector spinae muscle abscess IVDU with crystal meth-excludes him from home IV antibiotic infusion -Initial blood culture on 6/4 positive for MSSA.  Repeat blood culture on 6/6 negative. -TTE with TV vegetation.  TEE was not needed -MRI T-spine without acute finding -Repeat lumbar MRI on 6/18 with progressive left L4 and L5 facet and pedicle osteo, confluent left lateral epidural inflammation from L4-S1 without epidural abscess and ongoing left 15 mm erector spinae intramuscular abscess -CT chest concerning for multifocal pneumonia. -ID recs: Antibiotics for 6 weeks preferably with IV Ancef versus IV followed by oritavancin and oral therapy.  -Repeat MRI lumbar spine in 2 to 4 weeks to reassess L4-S1 epidural inflammation -Glycemic control.  Chronic pain/bilateral chest pain: Looks like musculoskeletal but no clear explanation clinically and radiologically. -Encourage ambulation in the hallway -Continue Flexeril, tramadol, Percocet, Celexa and gabapentin -Transition to Suboxone when he is close to discharge  Delusion/insomnia-on 6/28, patient asked if  UFO has placed some tracking device in his rectum when he saw them few days ago.  He says he believes in [place].  He says he has a video of them on his phone when he saw them but does not have his phone with him now.  He also thinks someone might be tracking him when he moved from [place] to [place].  He does not feel he is in danger now.  He denies other visual hallucination.  Patient elected out of his room the night before and was found smoking cigarettes outside the hospital.  UDS negative. -Continue current regimen.   Uncontrolled NIDDM-2 with hyperglycemia: A1c 10.8% on 6/5. Recent Labs  Lab 02/10/22 1150 02/10/22 1634 02/10/22 2122 02/11/22 0729 02/11/22 1128  GLUCAP 101* 278* 194* 145* 114*  -Continue SSI-moderate -Continue NovoLog 7 units 3 times daily with meals -Continue Semglee at 20 units twice daily -Further adjustment as appropriate.   Essential hypertension: Normotensive. -Continue low-dose amlodipine   Constipation: KUB with moderate to large colonic stool burden.  Likely from pain meds. -Continue MiraLAX and Senokot-S -Added Movantik   Hypocalcemia/Hyponatremia/Hypomagnesemia: Resolved.  Tobacco use disorder -Patient was counseled to quit. -Nicotine patch   Normocytic anemia: H&H relatively stable. Recent Labs    10/21/21 1153 01/17/22 0722 01/18/22 0331 01/19/22 0939 01/28/22 0446 01/29/22 0419 02/03/22 0509 02/05/22 0501 02/11/22 0423  HGB 13.6 11.1* 11.4* 11.8* 10.3* 11.3* 10.4* 10.6* 10.0*  -Continue monitoring  Increased nutrient needs Body mass index is 24.52 kg/m. Nutrition Problem: Increased nutrient needs Etiology: acute illness (polysubstance abuse) Signs/Symptoms: estimated needs Interventions: Glucerna shake, Snacks, Premier Protein   DVT prophylaxis:  enoxaparin (LOVENOX) injection 40 mg Start: 02/03/22 1000 SCDs Start: 01/17/22 1405  Code Status: Full code Family Communication: None at bedside Level of care: Med-Surg Status  is: Inpatient Remains inpatient appropriate because: Due to IV antibiotics for MSSA bacteremia and osteomyelitis.  Not a candidate for home IV infusion.   Final disposition: TBD Consultants:  Infectious disease Interventional radiology  Sch Meds:  Scheduled Meds:  amLODipine  5 mg Oral Daily   celecoxib  200 mg Oral BID   cyclobenzaprine  10 mg Oral TID   enoxaparin (LOVENOX) injection  40 mg Subcutaneous Q24H   feeding supplement (GLUCERNA SHAKE)  237 mL Oral BID BM   gabapentin  400 mg Oral TID   insulin aspart  0-15 Units Subcutaneous TID WC   insulin aspart  0-5 Units Subcutaneous QHS   insulin aspart  7 Units Subcutaneous TID WC   insulin glargine-yfgn  20 Units Subcutaneous BID   melatonin  5 mg Oral QHS   multivitamin with minerals  1 tablet Oral Daily   naloxegol oxalate  25 mg Oral Daily   nicotine  21 mg Transdermal Daily   pantoprazole  40 mg Oral Daily   pneumococcal 20-valent conjugate vaccine  0.5 mL Intramuscular Tomorrow-1000   Ensure Max Protein  11 oz Oral BID   senna-docusate  1 tablet Oral BID   Continuous Infusions:   ceFAZolin (ANCEF) IV 2 g (02/11/22 0551)   PRN Meds:.acetaminophen **OR** acetaminophen, ALPRAZolam, benzocaine, bisacodyl, ondansetron **OR** ondansetron (ZOFRAN) IV, oxyCODONE-acetaminophen, polyethylene glycol, sodium phosphate, traMADol  Antimicrobials: Anti-infectives (From admission, onward)    Start  Dose/Rate Route Frequency Ordered Stop   01/18/22 0600  ceFAZolin (ANCEF) IVPB 2g/100 mL premix        2 g 200 mL/hr over 30 Minutes Intravenous Every 8 hours 01/18/22 0330     01/18/22 0200  vancomycin (VANCOREADY) IVPB 1250 mg/250 mL  Status:  Discontinued        1,250 mg 166.7 mL/hr over 90 Minutes Intravenous Every 12 hours 01/17/22 1741 01/18/22 0330   01/17/22 2200  ceFEPIme (MAXIPIME) 2 g in sodium chloride 0.9 % 100 mL IVPB  Status:  Discontinued        2 g 200 mL/hr over 30 Minutes Intravenous Every 8 hours 01/17/22 1645  01/18/22 0330   01/17/22 1400  vancomycin (VANCOREADY) IVPB 1500 mg/300 mL        1,500 mg 150 mL/hr over 120 Minutes Intravenous  Once 01/17/22 1323 01/17/22 1629   01/17/22 1330  ceFEPIme (MAXIPIME) 2 g in sodium chloride 0.9 % 100 mL IVPB        2 g 200 mL/hr over 30 Minutes Intravenous  Once 01/17/22 1323 01/17/22 1629        I have personally reviewed the following labs and images: CBC: Recent Labs  Lab 02/05/22 0501 02/11/22 0423  WBC 5.3 4.9  NEUTROABS 2.7 2.3  HGB 10.6* 10.0*  HCT 32.5* 30.8*  MCV 84.9 84.4  PLT 328 258   BMP &GFR Recent Labs  Lab 02/05/22 0501 02/11/22 0423  NA 136 139  K 4.3 4.3  CL 98 104  CO2 31 29  GLUCOSE 129* 125*  BUN 23* 26*  CREATININE 0.73 0.91  CALCIUM 9.5 9.3  MG 1.7 1.8  PHOS 4.7* 4.8*   Estimated Creatinine Clearance: 98.3 mL/min (by C-G formula based on SCr of 0.91 mg/dL). Liver & Pancreas: Recent Labs  Lab 02/05/22 0501 02/11/22 0423  AST  --  14*  ALT  --  9  ALKPHOS  --  94  BILITOT  --  0.2*  PROT  --  7.8  ALBUMIN 2.9* 2.9*   No results for input(s): "LIPASE", "AMYLASE" in the last 168 hours. No results for input(s): "AMMONIA" in the last 168 hours. Diabetic: No results for input(s): "HGBA1C" in the last 72 hours. Recent Labs  Lab 02/10/22 1150 02/10/22 1634 02/10/22 2122 02/11/22 0729 02/11/22 1128  GLUCAP 101* 278* 194* 145* 114*   Cardiac Enzymes: No results for input(s): "CKTOTAL", "CKMB", "CKMBINDEX", "TROPONINI" in the last 168 hours. No results for input(s): "PROBNP" in the last 8760 hours. Coagulation Profile: No results for input(s): "INR", "PROTIME" in the last 168 hours. Thyroid Function Tests: No results for input(s): "TSH", "T4TOTAL", "FREET4", "T3FREE", "THYROIDAB" in the last 72 hours. Lipid Profile: No results for input(s): "CHOL", "HDL", "LDLCALC", "TRIG", "CHOLHDL", "LDLDIRECT" in the last 72 hours. Anemia Panel: No results for input(s): "VITAMINB12", "FOLATE", "FERRITIN",  "TIBC", "IRON", "RETICCTPCT" in the last 72 hours. Urine analysis:    Component Value Date/Time   COLORURINE YELLOW 08/21/2019 1812   APPEARANCEUR CLEAR 08/21/2019 1812   LABSPEC 1.025 08/21/2019 1812   PHURINE 6.5 08/21/2019 1812   GLUCOSEU 250 (A) 08/21/2019 1812   HGBUR NEGATIVE 08/21/2019 1812   BILIRUBINUR LARGE (A) 08/21/2019 1812   KETONESUR 15 (A) 08/21/2019 1812   PROTEINUR NEGATIVE 08/21/2019 1812   NITRITE NEGATIVE 08/21/2019 1812   LEUKOCYTESUR NEGATIVE 08/21/2019 1812   Sepsis Labs: Invalid input(s): "PROCALCITONIN", "LACTICIDVEN"  Microbiology: Recent Results (from the past 240 hour(s))  Aerobic/Anaerobic Culture w Gram Stain (surgical/deep wound)  Status: None   Collection Time: 02/02/22  3:06 PM   Specimen: Abscess  Result Value Ref Range Status   Specimen Description   Final    ABSCESS Performed at [facility], [address], [place], [place] [address]    Special Requests   Final    NONE Performed at [facility], [address] [place], [place] [address]    Gram Stain   Final    ABUNDANT WBC PRESENT, PREDOMINANTLY PMN FEW GRAM POSITIVE COCCI    Culture   Final    FEW STAPHYLOCOCCUS AUREUS NO ANAEROBES ISOLATED Performed at [facility], [address], [place], [place] [address]    Report Status 02/07/2022 FINAL  Final   Organism ID, Bacteria STAPHYLOCOCCUS AUREUS  Final      Susceptibility   Staphylococcus aureus - MIC*    CIPROFLOXACIN <=0.5 SENSITIVE Sensitive     ERYTHROMYCIN <=0.25 SENSITIVE Sensitive     GENTAMICIN <=0.5 SENSITIVE Sensitive     OXACILLIN <=0.25 SENSITIVE Sensitive     TETRACYCLINE <=1 SENSITIVE Sensitive     VANCOMYCIN 1 SENSITIVE Sensitive     TRIMETH/SULFA <=10 SENSITIVE Sensitive     CLINDAMYCIN <=0.25 SENSITIVE Sensitive     RIFAMPIN <=0.5 SENSITIVE Sensitive     Inducible Clindamycin NEGATIVE Sensitive     * FEW STAPHYLOCOCCUS AUREUS    Radiology Studies: No  results found.    Osha Errico [facility]  If 7PM-7AM, please contact night-coverage [email or web address] 02/11/2022, 2:06 PM

## 2022-02-11 NOTE — Progress Notes (Signed)
Patient called RN into the room having severe chest pain, RN completed an EKG and it showed normal sinus rhythm. Pain medication given and MD notified.

## 2022-02-11 NOTE — Plan of Care (Signed)
  Problem: Fluid Volume: Goal: Ability to maintain a balanced intake and output will improve Outcome: Progressing   

## 2022-02-12 LAB — GLUCOSE, CAPILLARY
Glucose-Capillary: 157 mg/dL — ABNORMAL HIGH (ref 70–99)
Glucose-Capillary: 107 mg/dL — ABNORMAL HIGH (ref 70–99)
Glucose-Capillary: 149 mg/dL — ABNORMAL HIGH (ref 70–99)
Glucose-Capillary: 141 mg/dL — ABNORMAL HIGH (ref 70–99)

## 2022-02-12 NOTE — Plan of Care (Signed)
  Problem: Health Behavior/Discharge Planning: Goal: Ability to manage health-related needs will improve Outcome: Progressing   

## 2022-02-12 NOTE — Progress Notes (Signed)
[facility]  Date of Admission:  01/17/2022           Reason for visit: Follow up on MSSA bacteremia  Current antibiotics: Cefazolin   ASSESSMENT:    56 y.o. male admitted with:  MSSA bacteremia: Initial blood cultures positive from 6/4 with repeat cultures 6/6 negative.  Noted to have a tricuspid valve vegetation on TTE. Pulmonary septic emboli: Currently on room air. L4-5 septic arthritis/osteomyelitis, left erector spinae muscle abscess, and L4-S1 lateral epidural inflammation (no epidural abscess): Status post ultrasound-guided aspiration on 02/02/2022 of intra-muscular abscess with majority of fluid aspirated at that time.  Cultures from this also grew MSSA. Injection drug use Hx of HCV: RNA not detected 01/20/22. Diabetes: A1c is 10.8.  RECOMMENDATIONS:    Continue cefazolin Plan for repeat MRI of lumbar spine early next week to determine next steps in antibiotic plan.  His ESR has gone up but CRP improved so difficult to interpret.  Clinically, he reports back pain improved and looks okay so I think reasonable to wait until next week to repeat imaging Lab monitoring Glycemic control Will follow Dr West Bali is here Monday   Principal Problem:   MSSA bacteremia Active Problems:   Tobacco abuse   Septic arthritis of lumbar spine ([place])   Controlled type 2 diabetes mellitus without complication, without long-term current use of insulin (HCC)   Hypocalcemia   Normocytic anemia   Hyponatremia   Essential hypertension   Hypomagnesemia   IVDU (intravenous drug user)   Osteomyelitis of lumbar spine (HCC)   Chronic pain   Insomnia   Left erector spinae muscle abscess   Constipation   Infective endocarditis of tricuspid valve   Acute low back pain   Osteomyelitis (HCC)   Paraspinal abscess (HCC)   Delusion (HCC)    MEDICATIONS:    Scheduled Meds:  amLODipine  5 mg Oral Daily   celecoxib  200 mg Oral BID   cyclobenzaprine  10 mg  Oral TID   enoxaparin (LOVENOX) injection  40 mg Subcutaneous Q24H   feeding supplement (GLUCERNA SHAKE)  237 mL Oral BID BM   gabapentin  400 mg Oral TID   insulin aspart  0-15 Units Subcutaneous TID WC   insulin aspart  0-5 Units Subcutaneous QHS   insulin aspart  7 Units Subcutaneous TID WC   insulin glargine-yfgn  20 Units Subcutaneous BID   melatonin  5 mg Oral QHS   multivitamin with minerals  1 tablet Oral Daily   naloxegol oxalate  25 mg Oral Daily   nicotine  21 mg Transdermal Daily   pantoprazole  40 mg Oral Daily   pneumococcal 20-valent conjugate vaccine  0.5 mL Intramuscular Tomorrow-1000   Ensure Max Protein  11 oz Oral BID   senna-docusate  1 tablet Oral BID   Continuous Infusions:   ceFAZolin (ANCEF) IV Stopped (02/12/22 0611)   PRN Meds:.acetaminophen **OR** acetaminophen, ALPRAZolam, benzocaine, bisacodyl, ondansetron **OR** ondansetron (ZOFRAN) IV, oxyCODONE-acetaminophen, polyethylene glycol, sodium phosphate, traMADol  SUBJECTIVE:   24 hour events:  No events noted overnight  Reports doing fairly well.  States his back pain today is improved from earlier in the week.  Agreeable to repeat MRI next week.    Review of Systems  All other systems reviewed and are negative.     OBJECTIVE:   Blood pressure 126/72, pulse 72, temperature 98 F (36.7 C), temperature source Oral, resp. rate 14, height 6' (1.829 m), weight 82 kg,  SpO2 97 %. Body mass index is 24.52 kg/m.  Physical Exam Constitutional:      Appearance: Normal appearance.  HENT:     Head: Normocephalic and atraumatic.  Eyes:     Extraocular Movements: Extraocular movements intact.     Conjunctiva/sclera: Conjunctivae normal.  Abdominal:     General: There is no distension.     Palpations: Abdomen is soft.  Musculoskeletal:        General: Normal range of motion.  Neurological:     General: No focal deficit present.     Mental Status: He is alert and oriented to person, place, and time.   Psychiatric:        Mood and Affect: Mood normal.        Behavior: Behavior normal.      Lab Results: Lab Results  Component Value Date   WBC 4.9 02/11/2022   HGB 10.0 (L) 02/11/2022   HCT 30.8 (L) 02/11/2022   MCV 84.4 02/11/2022   PLT 258 02/11/2022    Lab Results  Component Value Date   NA 139 02/11/2022   K 4.3 02/11/2022   CO2 29 02/11/2022   GLUCOSE 125 (H) 02/11/2022   BUN 26 (H) 02/11/2022   CREATININE 0.91 02/11/2022   CALCIUM 9.3 02/11/2022   GFRNONAA >60 02/11/2022   GFRAA >60 08/22/2019    Lab Results  Component Value Date   ALT 9 02/11/2022   AST 14 (L) 02/11/2022   ALKPHOS 94 02/11/2022   BILITOT 0.2 (L) 02/11/2022       Component Value Date/Time   CRP 3.6 (H) 02/11/2022 0423       Component Value Date/Time   ESRSEDRATE 105 (H) 02/11/2022 0423     I have reviewed the micro and lab results in Epic.  Imaging: DG Abd Portable 1V  Result Date: 02/10/2022 CLINICAL DATA:  Constipation EXAM: PORTABLE ABDOMEN - 1 VIEW COMPARISON:  None Available. FINDINGS: No evidence of bowel obstruction. There is a moderate to large colonic stool burden, most concentrated in the ascending and transverse colon. No acute osseous abnormality. There are few punctate metallic debris overlying the abdomen, unclear if internal or external, not seen on prior CT in January 2021. IMPRESSION: Moderate to large colonic stool burden most concentrated in the ascending and transverse colon. No evidence of bowel obstruction. Electronically Signed   By: Maurine Simmering M.D.   On: 02/10/2022 09:48     Imaging independently reviewed in Epic.    [facility] [phone] pager 02/12/2022, 8:10 AM  I have personally spent 50 minutes involved in face-to-face and non-face-to-face activities for this patient on the day of the visit. Professional time spent includes the following activities: Preparing to see the patient  (review of tests), Obtaining and/or reviewing separately obtained history (admission/discharge record), Performing a medically appropriate examination and/or evaluation , Ordering medications/tests/procedures, referring and communicating with other health care professionals, Documenting clinical information in the EMR, Independently interpreting results (not separately reported), Communicating results to the patient/family/caregiver, Counseling and educating the patient/family/caregiver and Care coordination (not separately reported).

## 2022-02-12 NOTE — Progress Notes (Signed)
PROGRESS NOTE  Tanner [name] WUJ:[id number] DOB: 18-Mar-1966   PCP: Patient, No Pcp Per  Patient is from: Home  DOA: 01/17/2022 LOS: 26  Chief complaints Chief Complaint  Patient presents with   Back Pain     Brief Narrative / Interim history: 56 year old M with PMH of DM-2, HTN, polysubstance use including IVDU with crystal meth, tobacco use disorder and hepatitis C presenting with back pain and found to have MSSA bacteremia with tricuspid valve endocarditis, septic emboli to the lungs, septic arthritis/osteomyelitis of L4-5 facet, epidural inflammation in L4-S1 as well as left erector spinae muscle abscess.  He underwent US-guided drainage of left erector spinae abscess on 02/02/2022.  ID recommends antibiotics for 6 weeks preferably with IV Ancef versus IV followed by oritavancin and oral therapy.   Subjective: Seen and examined earlier this morning.  No major events overnight of this morning.  No specific complaints.  Objective: Vitals:   02/11/22 1405 02/11/22 2134 02/12/22 0531 02/12/22 1319  BP: 127/76 (!) 150/77 126/72 116/71  Pulse: 79 80 72 78  Resp: 18 (!) 22 14 16   Temp: 97.9 F (36.6 C) 98.1 F (36.7 C) 98 F (36.7 C) 97.7 F (36.5 C)  TempSrc: Oral Oral Oral Oral  SpO2: 98% 98% 97% 100%  Weight:      Height:        Examination: GENERAL: No apparent distress.  Nontoxic. HEENT: MMM.  Vision and hearing grossly intact.  NECK: Supple.  No apparent JVD.  RESP:  No IWOB.  Fair aeration bilaterally. CVS:  RRR. Heart sounds normal.  ABD/GI/GU: BS+. Abd soft, NTND.  MSK/EXT:  Moves extremities. No apparent deformity. No edema.  SKIN: no apparent skin lesion or wound NEURO: Awake and alert. Oriented appropriately.  No apparent focal neuro deficit. PSYCH: Calm. Normal affect.   Procedures:  6/20-US guided aspiration of left erector spinae muscle abscess  Microbiology summarized: 6/4-blood culture with MSSA. 6/6-blood culture NGTD  Assessment and  plan: Principal Problem:   MSSA bacteremia Active Problems:   Septic arthritis of lumbar spine (HCC)   IVDU (intravenous drug user)   Osteomyelitis of lumbar spine (HCC)   Left erector spinae muscle abscess   Infective endocarditis of tricuspid valve   Chronic pain   Insomnia   Controlled type 2 diabetes mellitus without complication, without long-term current use of insulin (HCC)   Essential hypertension   Constipation   Hypocalcemia   Hyponatremia   Hypomagnesemia   Tobacco abuse   Normocytic anemia   Acute low back pain   Osteomyelitis (HCC)   Paraspinal abscess (HCC)   Delusion (HCC)  MSSA bacteremia/infective TV endocarditis/septic emboli to the lung/L4-5 septic arthritis and osteomyelitis/epidural inflammation/left erector spinae muscle abscess IVDU with crystal meth-excludes him from home IV antibiotic infusion -Initial blood culture on 6/4 positive for MSSA.  Repeat blood culture on 6/6 negative. -TTE with TV vegetation.  TEE was not needed -MRI T-spine without acute finding -Repeat lumbar MRI on 6/18 with progressive left L4 and L5 facet and pedicle osteo, confluent left lateral epidural inflammation from L4-S1 without epidural abscess and ongoing left 15 mm erector spinae intramuscular abscess -CT chest concerning for multifocal pneumonia likely embolic. -ID recs: Preferably 6 weeks of IV Ancef versus IV followed by oritavancin and oral therapy.  -Repeat MRI lumbar spine in 2 to 4 weeks to reassess L4-S1 epidural inflammation -Glycemic control.  Chronic pain/bilateral chest pain: Likely musculoskeletal.  Tender on exam -Encourage ambulation in the hallway -Continue Flexeril, tramadol, Percocet, Celexa  and gabapentin -Transition to Suboxone when he is close to discharge  Delusion/insomnia-on 6/28, patient asked if UFO has placed some tracking device in his rectum when he saw them few days ago.  He says he believes in [place].  He says he has a video of them on his phone  when he saw them but does not have his phone with him now.  He also thinks someone might be tracking him when he moved from [place] to [place].  He does not feel he is in danger now.  He denies other visual hallucination.  Patient elected out of his room the night before and was found smoking cigarettes outside the hospital.  UDS negative. -Continue current regimen.   Uncontrolled NIDDM-2 with hyperglycemia: A1c 10.8% on 6/5. Recent Labs  Lab 02/11/22 1128 02/11/22 1631 02/11/22 2130 02/12/22 0810 02/12/22 1220  GLUCAP 114* 126* 182* 157* 107*  -Continue SSI-moderate -Continue NovoLog 7 units 3 times daily with meals -Continue Semglee at 20 units twice daily -Further adjustment as appropriate.   Essential hypertension: Normotensive. -Continue low-dose amlodipine   Constipation: KUB with moderate to large colonic stool burden.  Likely from pain meds. -Continue MiraLAX and Senokot-S -Added Movantik   Hypocalcemia/Hyponatremia/Hypomagnesemia: Resolved.  Tobacco use disorder -Patient was counseled to quit. -Nicotine patch   Normocytic anemia: H&H relatively stable. Recent Labs    10/21/21 1153 01/17/22 0722 01/18/22 0331 01/19/22 0939 01/28/22 0446 01/29/22 0419 02/03/22 0509 02/05/22 0501 02/11/22 0423  HGB 13.6 11.1* 11.4* 11.8* 10.3* 11.3* 10.4* 10.6* 10.0*  -Continue monitoring  Increased nutrient needs Body mass index is 24.52 kg/m. Nutrition Problem: Increased nutrient needs Etiology: acute illness (polysubstance abuse) Signs/Symptoms: estimated needs Interventions: Glucerna shake, Snacks, Premier Protein   DVT prophylaxis:  enoxaparin (LOVENOX) injection 40 mg Start: 02/03/22 1000 SCDs Start: 01/17/22 1405  Code Status: Full code Family Communication: None at bedside Level of care: Med-Surg Status is: Inpatient Remains inpatient appropriate because: Due to IV antibiotics for MSSA bacteremia and osteomyelitis.  Not a candidate for home IV  infusion.   Final disposition: TBD Consultants:  Infectious disease Interventional radiology  Sch Meds:  Scheduled Meds:  amLODipine  5 mg Oral Daily   celecoxib  200 mg Oral BID   cyclobenzaprine  10 mg Oral TID   enoxaparin (LOVENOX) injection  40 mg Subcutaneous Q24H   feeding supplement (GLUCERNA SHAKE)  237 mL Oral BID BM   gabapentin  400 mg Oral TID   insulin aspart  0-15 Units Subcutaneous TID WC   insulin aspart  0-5 Units Subcutaneous QHS   insulin aspart  7 Units Subcutaneous TID WC   insulin glargine-yfgn  20 Units Subcutaneous BID   melatonin  5 mg Oral QHS   multivitamin with minerals  1 tablet Oral Daily   naloxegol oxalate  25 mg Oral Daily   nicotine  21 mg Transdermal Daily   pantoprazole  40 mg Oral Daily   pneumococcal 20-valent conjugate vaccine  0.5 mL Intramuscular Tomorrow-1000   Ensure Max Protein  11 oz Oral BID   senna-docusate  1 tablet Oral BID   Continuous Infusions:   ceFAZolin (ANCEF) IV Stopped (02/12/22 0611)   PRN Meds:.acetaminophen **OR** acetaminophen, ALPRAZolam, benzocaine, bisacodyl, ondansetron **OR** ondansetron (ZOFRAN) IV, oxyCODONE-acetaminophen, polyethylene glycol, sodium phosphate, traMADol  Antimicrobials: Anti-infectives (From admission, onward)    Start     Dose/Rate Route Frequency Ordered Stop   01/18/22 0600  ceFAZolin (ANCEF) IVPB 2g/100 mL premix        2  g 200 mL/hr over 30 Minutes Intravenous Every 8 hours 01/18/22 0330     01/18/22 0200  vancomycin (VANCOREADY) IVPB 1250 mg/250 mL  Status:  Discontinued        1,250 mg 166.7 mL/hr over 90 Minutes Intravenous Every 12 hours 01/17/22 1741 01/18/22 0330   01/17/22 2200  ceFEPIme (MAXIPIME) 2 g in sodium chloride 0.9 % 100 mL IVPB  Status:  Discontinued        2 g 200 mL/hr over 30 Minutes Intravenous Every 8 hours 01/17/22 1645 01/18/22 0330   01/17/22 1400  vancomycin (VANCOREADY) IVPB 1500 mg/300 mL        1,500 mg 150 mL/hr over 120 Minutes Intravenous  Once  01/17/22 1323 01/17/22 1629   01/17/22 1330  ceFEPIme (MAXIPIME) 2 g in sodium chloride 0.9 % 100 mL IVPB        2 g 200 mL/hr over 30 Minutes Intravenous  Once 01/17/22 1323 01/17/22 1629        I have personally reviewed the following labs and images: CBC: Recent Labs  Lab 02/11/22 0423  WBC 4.9  NEUTROABS 2.3  HGB 10.0*  HCT 30.8*  MCV 84.4  PLT 258   BMP &GFR Recent Labs  Lab 02/11/22 0423  NA 139  K 4.3  CL 104  CO2 29  GLUCOSE 125*  BUN 26*  CREATININE 0.91  CALCIUM 9.3  MG 1.8  PHOS 4.8*   Estimated Creatinine Clearance: 98.3 mL/min (by C-G formula based on SCr of 0.91 mg/dL). Liver & Pancreas: Recent Labs  Lab 02/11/22 0423  AST 14*  ALT 9  ALKPHOS 94  BILITOT 0.2*  PROT 7.8  ALBUMIN 2.9*   No results for input(s): "LIPASE", "AMYLASE" in the last 168 hours. No results for input(s): "AMMONIA" in the last 168 hours. Diabetic: No results for input(s): "HGBA1C" in the last 72 hours. Recent Labs  Lab 02/11/22 1128 02/11/22 1631 02/11/22 2130 02/12/22 0810 02/12/22 1220  GLUCAP 114* 126* 182* 157* 107*   Cardiac Enzymes: No results for input(s): "CKTOTAL", "CKMB", "CKMBINDEX", "TROPONINI" in the last 168 hours. No results for input(s): "PROBNP" in the last 8760 hours. Coagulation Profile: No results for input(s): "INR", "PROTIME" in the last 168 hours. Thyroid Function Tests: No results for input(s): "TSH", "T4TOTAL", "FREET4", "T3FREE", "THYROIDAB" in the last 72 hours. Lipid Profile: No results for input(s): "CHOL", "HDL", "LDLCALC", "TRIG", "CHOLHDL", "LDLDIRECT" in the last 72 hours. Anemia Panel: No results for input(s): "VITAMINB12", "FOLATE", "FERRITIN", "TIBC", "IRON", "RETICCTPCT" in the last 72 hours. Urine analysis:    Component Value Date/Time   COLORURINE YELLOW 08/21/2019 1812   APPEARANCEUR CLEAR 08/21/2019 1812   LABSPEC 1.025 08/21/2019 1812   PHURINE 6.5 08/21/2019 1812   GLUCOSEU 250 (A) 08/21/2019 1812   HGBUR  NEGATIVE 08/21/2019 1812   BILIRUBINUR LARGE (A) 08/21/2019 1812   KETONESUR 15 (A) 08/21/2019 1812   PROTEINUR NEGATIVE 08/21/2019 1812   NITRITE NEGATIVE 08/21/2019 1812   LEUKOCYTESUR NEGATIVE 08/21/2019 1812   Sepsis Labs: Invalid input(s): "PROCALCITONIN", "LACTICIDVEN"  Microbiology: Recent Results (from the past 240 hour(s))  Aerobic/Anaerobic Culture w Gram Stain (surgical/deep wound)     Status: None   Collection Time: 02/02/22  3:06 PM   Specimen: Abscess  Result Value Ref Range Status   Specimen Description   Final    ABSCESS Performed at [facility], [address], [place], [place] [address]    Special Requests   Final    NONE Performed at  [facility], [address], [place], [place] [address]    Gram Stain   Final    ABUNDANT WBC PRESENT, PREDOMINANTLY PMN FEW GRAM POSITIVE COCCI    Culture   Final    FEW STAPHYLOCOCCUS AUREUS NO ANAEROBES ISOLATED Performed at [facility], [address] [place], [place] [address]    Report Status 02/07/2022 FINAL  Final   Organism ID, Bacteria STAPHYLOCOCCUS AUREUS  Final      Susceptibility   Staphylococcus aureus - MIC*    CIPROFLOXACIN <=0.5 SENSITIVE Sensitive     ERYTHROMYCIN <=0.25 SENSITIVE Sensitive     GENTAMICIN <=0.5 SENSITIVE Sensitive     OXACILLIN <=0.25 SENSITIVE Sensitive     TETRACYCLINE <=1 SENSITIVE Sensitive     VANCOMYCIN 1 SENSITIVE Sensitive     TRIMETH/SULFA <=10 SENSITIVE Sensitive     CLINDAMYCIN <=0.25 SENSITIVE Sensitive     RIFAMPIN <=0.5 SENSITIVE Sensitive     Inducible Clindamycin NEGATIVE Sensitive     * FEW STAPHYLOCOCCUS AUREUS    Radiology Studies: No results found.    Hollie Wojahn T. Gilma Bessette [facility]  If 7PM-7AM, please contact night-coverage [email or web address] 02/12/2022, 2:18 PM

## 2022-02-12 NOTE — Progress Notes (Signed)
24 hour chart audit completed 

## 2022-02-12 NOTE — Plan of Care (Signed)
  Problem: Education: Goal: Knowledge of General Education information will improve Description: Including pain rating scale, medication(s)/side effects and non-pharmacologic comfort measures Outcome: Progressing   Problem: Pain Managment: Goal: General experience of comfort will improve Outcome: Progressing   

## 2022-02-13 LAB — GLUCOSE, CAPILLARY
Glucose-Capillary: 128 mg/dL — ABNORMAL HIGH (ref 70–99)
Glucose-Capillary: 92 mg/dL (ref 70–99)
Glucose-Capillary: 190 mg/dL — ABNORMAL HIGH (ref 70–99)
Glucose-Capillary: 151 mg/dL — ABNORMAL HIGH (ref 70–99)

## 2022-02-13 NOTE — Progress Notes (Signed)
24 hour chart audit completed 

## 2022-02-13 NOTE — Progress Notes (Signed)
PROGRESS NOTE  Tanner [name] [id number] DOB: 01/28/1966   PCP: Patient, No Pcp Per  Patient is from: Home  DOA: 01/17/2022 LOS: 8  Chief complaints Chief Complaint  Patient presents with   Back Pain     Brief Narrative / Interim history: 56 year old M with PMH of DM-2, HTN, polysubstance use including IVDU with crystal meth, tobacco use disorder and hepatitis C presenting with back pain and found to have MSSA bacteremia with tricuspid valve endocarditis, septic emboli to the lungs, septic arthritis/osteomyelitis of L4-5 facet, epidural inflammation in L4-S1 as well as left erector spinae muscle abscess.  He underwent US-guided drainage of left erector spinae abscess on 02/02/2022.  ID recommends antibiotics for 6 weeks preferably with IV Ancef versus IV followed by oritavancin and oral therapy.   Subjective: Seen and examined earlier this morning.  No major events overnight of this morning.  He is complaining of lower back pain slightly to the left.  No other complaints.  Objective: Vitals:   02/12/22 0531 02/12/22 1319 02/12/22 2046 02/13/22 0455  BP: 126/72 116/71 (!) 151/85 (!) 144/80  Pulse: 72 78 67 87  Resp: 14 16 16 14   Temp: 98 F (36.7 C) 97.7 F (36.5 C) 98.3 F (36.8 C) 97.6 F (36.4 C)  TempSrc: Oral Oral Oral Oral  SpO2: 97% 100% 98% 99%  Weight:      Height:        Examination: GENERAL: No apparent distress.  Nontoxic. HEENT: MMM.  Vision and hearing grossly intact.  NECK: Supple.  No apparent JVD.  RESP:  No IWOB.  Fair aeration bilaterally. CVS:  RRR. Heart sounds normal.  ABD/GI/GU: BS+. Abd soft, NTND.  MSK/EXT:  Moves extremities.  Tenderness over left lumbar paraspinal muscle SKIN: no apparent skin lesion or wound NEURO: Awake and alert. Oriented appropriately.  No apparent focal neuro deficit. PSYCH: Calm. Normal affect.   Procedures:  6/20-US guided aspiration of left erector spinae muscle abscess  Microbiology summarized: 6/4-blood  culture with MSSA. 6/6-blood culture NGTD  Assessment and plan: Principal Problem:   MSSA bacteremia Active Problems:   Septic arthritis of lumbar spine (HCC)   IVDU (intravenous drug user)   Osteomyelitis of lumbar spine (HCC)   Left erector spinae muscle abscess   Infective endocarditis of tricuspid valve   Chronic pain   Insomnia   Controlled type 2 diabetes mellitus without complication, without long-term current use of insulin (HCC)   Essential hypertension   Constipation   Hypocalcemia   Hyponatremia   Hypomagnesemia   Tobacco abuse   Normocytic anemia   Acute low back pain   Osteomyelitis (HCC)   Paraspinal abscess (HCC)   Delusion (HCC)  MSSA bacteremia/infective TV endocarditis/septic emboli to the lung/L4-5 septic arthritis and osteomyelitis/epidural inflammation/left erector spinae muscle abscess IVDU with crystal meth-excludes him from home IV antibiotic infusion -Initial blood culture on 6/4 positive for MSSA.  Repeat blood culture on 6/6 negative. -TTE with TV vegetation.  TEE was not needed -MRI T-spine without acute finding -Repeat lumbar MRI on 6/18 with progressive left L4 and L5 facet and pedicle osteo, confluent left lateral epidural inflammation from L4-S1 without epidural abscess and ongoing left 15 mm erector spinae intramuscular abscess -CT chest concerning for multifocal pneumonia likely embolic. -ID recs: Preferably 6 weeks of IV Ancef versus IV followed by oritavancin and oral therapy.  -Repeat MRI lumbar spine in 2 to 4 weeks to reassess L4-S1 epidural inflammation -Glycemic control.  Chronic pain/bilateral chest pain: Likely musculoskeletal.  Tender on exam -Encourage ambulation in the hallway -Continue Flexeril, tramadol, Percocet, Celexa and gabapentin -Transition to Suboxone when he is close to discharge  Delusion/insomnia-on 6/28, patient asked if [employer] has placed some tracking device in his rectum when he saw them few days ago.  He says he  believes in [place].  He says he has a video of them on his phone when he saw them but does not have his phone with him now.  He also thinks someone might be tracking him when he moved from [place] to [place].  He does not feel he is in danger now.  He denies other visual hallucination.  Patient elected out of his room the night before and was found smoking cigarettes outside the hospital.  UDS negative. -Continue current regimen.   Uncontrolled NIDDM-2 with hyperglycemia: A1c 10.8% on 6/5. Recent Labs  Lab 02/12/22 0810 02/12/22 1220 02/12/22 1623 02/12/22 2043 02/13/22 0747  GLUCAP 157* 107* 149* 141* 128*  -Continue SSI-moderate -Continue NovoLog 7 units 3 times daily with meals -Continue Semglee at 20 units twice daily -Further adjustment as appropriate.   Essential hypertension: Normotensive. -Continue low-dose amlodipine   Constipation: KUB with moderate to large colonic stool burden.  Likely from pain meds. -Continue MiraLAX, Senokot-S and Movantik   Hypocalcemia/Hyponatremia/Hypomagnesemia: Resolved.  Tobacco use disorder -Patient was counseled to quit. -Nicotine patch   Normocytic anemia: H&H relatively stable. Recent Labs    10/21/21 1153 01/17/22 0722 01/18/22 0331 01/19/22 0939 01/28/22 0446 01/29/22 0419 02/03/22 0509 02/05/22 0501 02/11/22 0423  HGB 13.6 11.1* 11.4* 11.8* 10.3* 11.3* 10.4* 10.6* 10.0*  -Continue monitoring  Increased nutrient needs Body mass index is 24.52 kg/m. Nutrition Problem: Increased nutrient needs Etiology: acute illness (polysubstance abuse) Signs/Symptoms: estimated needs Interventions: Glucerna shake, Snacks, Premier Protein   DVT prophylaxis:  enoxaparin (LOVENOX) injection 40 mg Start: 02/03/22 1000 SCDs Start: 01/17/22 1405  Code Status: Full code Family Communication: None at bedside Level of care: Med-Surg Status is: Inpatient Remains inpatient appropriate because: Due to IV antibiotics for MSSA bacteremia and  osteomyelitis.  Not a candidate for home IV infusion.   Final disposition: TBD Consultants:  Infectious disease Interventional radiology  Sch Meds:  Scheduled Meds:  amLODipine  5 mg Oral Daily   celecoxib  200 mg Oral BID   cyclobenzaprine  10 mg Oral TID   enoxaparin (LOVENOX) injection  40 mg Subcutaneous Q24H   feeding supplement (GLUCERNA SHAKE)  237 mL Oral BID BM   gabapentin  400 mg Oral TID   insulin aspart  0-15 Units Subcutaneous TID WC   insulin aspart  0-5 Units Subcutaneous QHS   insulin aspart  7 Units Subcutaneous TID WC   insulin glargine-yfgn  20 Units Subcutaneous BID   melatonin  5 mg Oral QHS   multivitamin with minerals  1 tablet Oral Daily   naloxegol oxalate  25 mg Oral Daily   nicotine  21 mg Transdermal Daily   pantoprazole  40 mg Oral Daily   pneumococcal 20-valent conjugate vaccine  0.5 mL Intramuscular Tomorrow-1000   Ensure Max Protein  11 oz Oral BID   senna-docusate  1 tablet Oral BID   Continuous Infusions:   ceFAZolin (ANCEF) IV 2 g (02/13/22 0624)   PRN Meds:.acetaminophen **OR** acetaminophen, ALPRAZolam, benzocaine, bisacodyl, ondansetron **OR** ondansetron (ZOFRAN) IV, oxyCODONE-acetaminophen, polyethylene glycol, sodium phosphate, traMADol  Antimicrobials: Anti-infectives (From admission, onward)    Start     Dose/Rate Route Frequency Ordered Stop   01/18/22 0600  ceFAZolin (  ANCEF) IVPB 2g/100 mL premix        2 g 200 mL/hr over 30 Minutes Intravenous Every 8 hours 01/18/22 0330     01/18/22 0200  vancomycin (VANCOREADY) IVPB 1250 mg/250 mL  Status:  Discontinued        1,250 mg 166.7 mL/hr over 90 Minutes Intravenous Every 12 hours 01/17/22 1741 01/18/22 0330   01/17/22 2200  ceFEPIme (MAXIPIME) 2 g in sodium chloride 0.9 % 100 mL IVPB  Status:  Discontinued        2 g 200 mL/hr over 30 Minutes Intravenous Every 8 hours 01/17/22 1645 01/18/22 0330   01/17/22 1400  vancomycin (VANCOREADY) IVPB 1500 mg/300 mL        1,500 mg 150  mL/hr over 120 Minutes Intravenous  Once 01/17/22 1323 01/17/22 1629   01/17/22 1330  ceFEPIme (MAXIPIME) 2 g in sodium chloride 0.9 % 100 mL IVPB        2 g 200 mL/hr over 30 Minutes Intravenous  Once 01/17/22 1323 01/17/22 1629        I have personally reviewed the following labs and images: CBC: Recent Labs  Lab 02/11/22 0423  WBC 4.9  NEUTROABS 2.3  HGB 10.0*  HCT 30.8*  MCV 84.4  PLT 258   BMP &GFR Recent Labs  Lab 02/11/22 0423  NA 139  K 4.3  CL 104  CO2 29  GLUCOSE 125*  BUN 26*  CREATININE 0.91  CALCIUM 9.3  MG 1.8  PHOS 4.8*   Estimated Creatinine Clearance: 98.3 mL/min (by C-G formula based on SCr of 0.91 mg/dL). Liver & Pancreas: Recent Labs  Lab 02/11/22 0423  AST 14*  ALT 9  ALKPHOS 94  BILITOT 0.2*  PROT 7.8  ALBUMIN 2.9*   No results for input(s): "LIPASE", "AMYLASE" in the last 168 hours. No results for input(s): "AMMONIA" in the last 168 hours. Diabetic: No results for input(s): "HGBA1C" in the last 72 hours. Recent Labs  Lab 02/12/22 0810 02/12/22 1220 02/12/22 1623 02/12/22 2043 02/13/22 0747  GLUCAP 157* 107* 149* 141* 128*   Cardiac Enzymes: No results for input(s): "CKTOTAL", "CKMB", "CKMBINDEX", "TROPONINI" in the last 168 hours. No results for input(s): "PROBNP" in the last 8760 hours. Coagulation Profile: No results for input(s): "INR", "PROTIME" in the last 168 hours. Thyroid Function Tests: No results for input(s): "TSH", "T4TOTAL", "FREET4", "T3FREE", "THYROIDAB" in the last 72 hours. Lipid Profile: No results for input(s): "CHOL", "HDL", "LDLCALC", "TRIG", "CHOLHDL", "LDLDIRECT" in the last 72 hours. Anemia Panel: No results for input(s): "VITAMINB12", "FOLATE", "FERRITIN", "TIBC", "IRON", "RETICCTPCT" in the last 72 hours. Urine analysis:    Component Value Date/Time   COLORURINE YELLOW 08/21/2019 1812   APPEARANCEUR CLEAR 08/21/2019 1812   LABSPEC 1.025 08/21/2019 1812   PHURINE 6.5 08/21/2019 1812    GLUCOSEU 250 (A) 08/21/2019 1812   HGBUR NEGATIVE 08/21/2019 1812   BILIRUBINUR LARGE (A) 08/21/2019 1812   KETONESUR 15 (A) 08/21/2019 1812   PROTEINUR NEGATIVE 08/21/2019 1812   NITRITE NEGATIVE 08/21/2019 1812   LEUKOCYTESUR NEGATIVE 08/21/2019 1812   Sepsis Labs: Invalid input(s): "PROCALCITONIN", "LACTICIDVEN"  Microbiology: No results found for this or any previous visit (from the past 240 hour(s)).   Radiology Studies: No results found.    Takhia Spoon T. Henli Hey [facility]  If 7PM-7AM, please contact night-coverage [email or web address] 02/13/2022, 11:06 AM

## 2022-02-14 LAB — GLUCOSE, CAPILLARY
Glucose-Capillary: 162 mg/dL — ABNORMAL HIGH (ref 70–99)
Glucose-Capillary: 139 mg/dL — ABNORMAL HIGH (ref 70–99)
Glucose-Capillary: 132 mg/dL — ABNORMAL HIGH (ref 70–99)
Glucose-Capillary: 75 mg/dL (ref 70–99)

## 2022-02-14 MED ORDER — INSULIN GLARGINE-YFGN 100 UNIT/ML ~~LOC~~ SOLN
20.0000 [IU] | Freq: Every day | SUBCUTANEOUS | Status: DC
  Administered 2022-02-14 – 2022-02-18 (×5): 20 [IU] via SUBCUTANEOUS
  Filled 2022-02-14 (×6): qty 0.2

## 2022-02-14 MED ORDER — INSULIN GLARGINE-YFGN 100 UNIT/ML ~~LOC~~ SOLN
14.0000 [IU] | Freq: Every day | SUBCUTANEOUS | Status: DC
  Administered 2022-02-15 – 2022-02-19 (×5): 14 [IU] via SUBCUTANEOUS
  Filled 2022-02-14 (×5): qty 0.14

## 2022-02-14 NOTE — Progress Notes (Signed)
PROGRESS NOTE    Tanner [name]  KGM:[id number] DOB: 18-Jan-1966 DOA: 01/17/2022 PCP: Patient, No Pcp Per     Brief Narrative:  56 year old M with PMH of DM-2, HTN, polysubstance use including IVDU with crystal meth, tobacco use disorder and hepatitis C presenting with back pain and found to have MSSA bacteremia with tricuspid valve endocarditis, septic emboli to the lungs, septic arthritis/osteomyelitis of L4-5 facet, epidural inflammation in L4-S1 as well as left erector spinae muscle abscess.  He underwent US-guided drainage of left erector spinae abscess on 02/02/2022.  ID recommends antibiotics for 6 weeks preferably with IV Ancef versus IV followed by oritavancin and oral therapy.   New events last 24 hours / Subjective: Pt reports he has been a little lightheaded which he attributes to his sugars dropping. Eating and drinking OK. Pain is controlled. No fevers, N/V/D, CP or SOB.   Assessment & Plan:   Principal Problem:   MSSA bacteremia  IVDU w meth excludes him from home IV abx infusion unfortunately  6/4 BC's show MSSA, 6/6 repeats clear  Has to have a repeat lumbar MRI 2-4 weeks after initial (6/18), could do as early as tomorrow  6 total weeks of Ancef  Appreciate ID   Active Problems:   Tobacco abuse  Encourage cessation    Septic arthritis of lumbar spine (HCC)/Osteomyelitis of lumbar spine (HCC)/Left erector spinae muscle abscess/  Paraspinal abscess (HCC)  As above  Plan is to transition to him suboxone when close to d/c    Uncontrolled type 2 diabetes mellitus without complication, without long-term current use of insulin (HCC)  May be having relative hypoglycemia, will lower AM Semglee dosage to 16 u from 20, cont 20 u nightly and Novolog 7 u TID w meals    Hypocalcemia  Resolved, intermittently monitor    Normocytic anemia  Stable, intermittently monitor    Hyponatremia  Resolved, intermittently monitor    Essential hypertension  Amlodipine 5 mg daily     Hypomagnesemia  Resolved    IVDU (intravenous drug user)      Chronic pain   Continue Flexeril, Celebrex, gabapentin    Constipation  Movantik, Senokot, MiraLAX    Delusion (HCC)/insomnia  Currently resolved  Continue nightly Xanax  Nutrition Problem: Increased nutrient needs Etiology: acute illness (polysubstance abuse)   DVT prophylaxis: Lovenox Code Status: Full Family Communication: None at bedside Coming From: Home Disposition Plan: Home Barriers to Discharge: Unable to discharge home due to need for antibiotics IV and he is not a candidate for home infusion due to history of IV drug use  Consultants:  ID IR  Antimicrobials:  Anti-infectives (From admission, onward)    Start     Dose/Rate Route Frequency Ordered Stop   01/18/22 0600  ceFAZolin (ANCEF) IVPB 2g/100 mL premix        2 g 200 mL/hr over 30 Minutes Intravenous Every 8 hours 01/18/22 0330     01/18/22 0200  vancomycin (VANCOREADY) IVPB 1250 mg/250 mL  Status:  Discontinued        1,250 mg 166.7 mL/hr over 90 Minutes Intravenous Every 12 hours 01/17/22 1741 01/18/22 0330   01/17/22 2200  ceFEPIme (MAXIPIME) 2 g in sodium chloride 0.9 % 100 mL IVPB  Status:  Discontinued        2 g 200 mL/hr over 30 Minutes Intravenous Every 8 hours 01/17/22 1645 01/18/22 0330   01/17/22 1400  vancomycin (VANCOREADY) IVPB 1500 mg/300 mL        1,500 mg  150 mL/hr over 120 Minutes Intravenous  Once 01/17/22 1323 01/17/22 1629   01/17/22 1330  ceFEPIme (MAXIPIME) 2 g in sodium chloride 0.9 % 100 mL IVPB        2 g 200 mL/hr over 30 Minutes Intravenous  Once 01/17/22 1323 01/17/22 1629        Objective: Vitals:   02/13/22 0455 02/13/22 1347 02/13/22 2138 02/14/22 0610  BP: (!) 144/80 138/88 131/71 121/70  Pulse: 87 84 78 74  Resp: 14  16 16   Temp: 97.6 F (36.4 C) 98.1 F (36.7 C) 97.8 F (36.6 C) (!) 97.5 F (36.4 C)  TempSrc: Oral Oral Oral Oral  SpO2: 99% 98% 99% 100%  Weight:      Height:         Intake/Output Summary (Last 24 hours) at 02/14/2022 1358 Last data filed at 02/14/2022 0741 Gross per 24 hour  Intake 660 ml  Output 1900 ml  Net -1240 ml   Filed Weights   01/17/22 0550 02/08/22 1600  Weight: 77 kg 82 kg    Examination:   General exam: Appears calm and comfortable  Respiratory system: Clear to auscultation. Respiratory effort normal. No respiratory distress. No conversational dyspnea.  Cardiovascular system: S1 & S2 heard, RRR. No murmurs. No pedal edema. Gastrointestinal system: Abdomen is nondistended, soft and nontender. Normal bowel sounds heard. Central nervous system: Alert and oriented. No focal neurological deficits. Speech clear.  Extremities: Symmetric in appearance  Skin: No rashes, lesions or ulcers on exposed skin  Psychiatry: Judgement and insight appear normal. Mood & affect appropriate.   Data Reviewed: I have personally reviewed following labs and imaging studies  CBC: Recent Labs  Lab 02/11/22 0423  WBC 4.9  NEUTROABS 2.3  HGB 10.0*  HCT 30.8*  MCV 84.4  PLT 258   Basic Metabolic Panel: Recent Labs  Lab 02/11/22 0423  NA 139  K 4.3  CL 104  CO2 29  GLUCOSE 125*  BUN 26*  CREATININE 0.91  CALCIUM 9.3  MG 1.8  PHOS 4.8*   GFR: Estimated Creatinine Clearance: 98.3 mL/min (by C-G formula based on SCr of 0.91 mg/dL).  Liver Function Tests: Recent Labs  Lab 02/11/22 0423  AST 14*  ALT 9  ALKPHOS 94  BILITOT 0.2*  PROT 7.8  ALBUMIN 2.9*   CBG: Recent Labs  Lab 02/13/22 1121 02/13/22 1634 02/13/22 2140 02/14/22 0739 02/14/22 1150  GLUCAP 92 190* 151* 162* 139*   Scheduled Meds:  amLODipine  5 mg Oral Daily   celecoxib  200 mg Oral BID   cyclobenzaprine  10 mg Oral TID   enoxaparin (LOVENOX) injection  40 mg Subcutaneous Q24H   feeding supplement (GLUCERNA SHAKE)  237 mL Oral BID BM   gabapentin  400 mg Oral TID   insulin aspart  0-15 Units Subcutaneous TID WC   insulin aspart  0-5 Units Subcutaneous QHS    insulin aspart  7 Units Subcutaneous TID WC   insulin glargine-yfgn  20 Units Subcutaneous BID   melatonin  5 mg Oral QHS   multivitamin with minerals  1 tablet Oral Daily   naloxegol oxalate  25 mg Oral Daily   nicotine  21 mg Transdermal Daily   pantoprazole  40 mg Oral Daily   pneumococcal 20-valent conjugate vaccine  0.5 mL Intramuscular Tomorrow-1000   Ensure Max Protein  11 oz Oral BID   senna-docusate  1 tablet Oral BID   Continuous Infusions:   ceFAZolin (ANCEF) IV 2 g (02/14/22  0518)     LOS: 28 days    Time spent: 40 minutes   Sharlene Dory, DO [facility] 02/14/2022, 1:58 PM   Available via Epic secure chat 7am-7pm After these hours, please refer to coverage provider listed on [email or web address]

## 2022-02-15 ENCOUNTER — Inpatient Hospital Stay (HOSPITAL_COMMUNITY): Payer: Self-pay

## 2022-02-15 ENCOUNTER — Encounter (HOSPITAL_COMMUNITY): Payer: Self-pay | Admitting: Radiology

## 2022-02-15 LAB — GLUCOSE, CAPILLARY
Glucose-Capillary: 166 mg/dL — ABNORMAL HIGH (ref 70–99)
Glucose-Capillary: 70 mg/dL (ref 70–99)
Glucose-Capillary: 196 mg/dL — ABNORMAL HIGH (ref 70–99)
Glucose-Capillary: 152 mg/dL — ABNORMAL HIGH (ref 70–99)

## 2022-02-15 LAB — CBC
HCT: 33.3 % — ABNORMAL LOW (ref 39.0–52.0)
Hemoglobin: 10.6 g/dL — ABNORMAL LOW (ref 13.0–17.0)
MCH: 27 pg (ref 26.0–34.0)
MCHC: 31.8 g/dL (ref 30.0–36.0)
MCV: 84.7 fL (ref 80.0–100.0)
Platelets: 273 10*3/uL (ref 150–400)
RBC: 3.93 MIL/uL — ABNORMAL LOW (ref 4.22–5.81)
RDW: 14.1 % (ref 11.5–15.5)
WBC: 7 10*3/uL (ref 4.0–10.5)
nRBC: 0 % (ref 0.0–0.2)

## 2022-02-15 LAB — MAGNESIUM: Magnesium: 1.9 mg/dL (ref 1.7–2.4)

## 2022-02-15 LAB — BASIC METABOLIC PANEL
Anion gap: 8 (ref 5–15)
BUN: 27 mg/dL — ABNORMAL HIGH (ref 6–20)
CO2: 27 mmol/L (ref 22–32)
Calcium: 9.2 mg/dL (ref 8.9–10.3)
Chloride: 102 mmol/L (ref 98–111)
Creatinine, Ser: 0.79 mg/dL (ref 0.61–1.24)
GFR, Estimated: >60 mL/min (ref >60)
Glucose, Bld: 124 mg/dL — ABNORMAL HIGH (ref 70–99)
Potassium: 4 mmol/L (ref 3.5–5.1)
Sodium: 137 mmol/L (ref 135–145)

## 2022-02-15 MED ORDER — GADOBUTROL 1 MMOL/ML IV SOLN
8.0000 mL | Freq: Once | INTRAVENOUS | Status: AC | PRN
  Administered 2022-02-15: 8 mL via INTRAVENOUS

## 2022-02-15 MED ORDER — LIVING WELL WITH DIABETES BOOK
Freq: Once | Status: DC
  Filled 2022-02-15: qty 1

## 2022-02-15 NOTE — Progress Notes (Signed)
PROGRESS NOTE    Tanner [name]  IEP:[id number] DOB: Jun 10, 1966 DOA: 01/17/2022 PCP: Patient, No Pcp Per   Brief Narrative: 56 year old M with PMH of DM-2, HTN, polysubstance use including IVDU with crystal meth, tobacco use disorder and hepatitis C presenting with back pain and found to have MSSA bacteremia with tricuspid valve endocarditis, septic emboli to the lungs, septic arthritis/osteomyelitis of L4-5 facet, epidural inflammation in L4-S1 as well as left erector spinae muscle abscess.  He underwent US-guided drainage of left erector spinae abscess on 02/02/2022.  ID recommends antibiotics for 6 weeks preferably with IV Ancef versus IV followed by oritavancin and oral therapy.      Assessment & Plan:   Principal Problem:   MSSA bacteremia Active Problems:   Septic arthritis of lumbar spine (HCC)   IVDU (intravenous drug user)   Osteomyelitis of lumbar spine (HCC)   Left erector spinae muscle abscess   Infective endocarditis of tricuspid valve   Chronic pain   Insomnia   Controlled type 2 diabetes mellitus without complication, without long-term current use of insulin (HCC)   Essential hypertension   Constipation   Hypocalcemia   Hyponatremia   Hypomagnesemia   Tobacco abuse   Normocytic anemia   Acute low back pain   Osteomyelitis (HCC)   Paraspinal abscess (HCC)   Delusion (HCC)  1-MSSA bacteremia, pulmonary septic emboli Blood culture 6/04 positive for MSSA, repeated 6/6 clear.  Plan to repeat MRI 2-4 weeks after initial 6/18 Plan for 6 weeks of total antibiotics.  Tricuspid valve vegetation on TEE.  On Ancef.   Septic arthritis of the lumbar spine osteomyelitis of the lumbar spine left erector spinae muscle abscess paraspinal abscess: S-p guide aspiration 6/20. Plan to repeat MRI this week. Will order MRI.   Uncontrolled diabetes type 2, insulin-dependent: Continue with Semglee BID.  Meals coverage.   Hypocalcemia: Resolved.  Normocytic anemia: Hb stable.   Hyponatremia: Resolved.  Hypertension: Continue with Norvasc.  Hypomagnesemia: Resolved.  IVDU; counseling. Needs to remain in hospital for IV antibiotics.  Chronic pain:oxycodone PRN Constipation: miralax PRN Delusional/insomnia:  Nutrition Problem: Increased nutrient needs Etiology: acute illness (polysubstance abuse)    Signs/Symptoms: estimated needs    Interventions: Glucerna shake, Snacks, Premier Protein  Estimated body mass index is 24.52 kg/m as calculated from the following:   Height as of this encounter: 6' (1.829 m).   Weight as of this encounter: 82 kg.   DVT prophylaxis: Lovenox Code Status: full code Family Communication: care discussed with patient.  Disposition Plan:  Status is: Inpatient Remains inpatient appropriate because: needs IV antibiotics    Consultants:  ID  Procedures:  S-p guide aspiration 6/20.  Antimicrobials:  Ancef  Subjective: He is ok, he wants his diet liberalized, I place order for Diabetes educator.    Objective: Vitals:   02/14/22 0610 02/14/22 1603 02/14/22 2058 02/15/22 0630  BP: 121/70 (!) 154/86 136/71 135/83  Pulse: 74 95 92 73  Resp: 16 18 18 17   Temp: (!) 97.5 F (36.4 C) 98.4 F (36.9 C) 98.3 F (36.8 C) 98.2 F (36.8 C)  TempSrc: Oral Oral Oral Oral  SpO2: 100% 100% 99% 97%  Weight:      Height:        Intake/Output Summary (Last 24 hours) at 02/15/2022 0727 Last data filed at 02/15/2022 0600 Gross per 24 hour  Intake 560 ml  Output 3475 ml  Net -2915 ml   Filed Weights   01/17/22 0550 02/08/22 1600  Weight: 77 kg  82 kg    Examination:  General exam: Appears calm and comfortable  Respiratory system: Clear to auscultation. Respiratory effort normal. Cardiovascular system: S1 & S2 heard, RRR.  Gastrointestinal system: Abdomen is nondistended, soft and nontender. No organomegaly or masses felt. Normal bowel sounds heard. Central nervous system: Alert and oriented.  Extremities: Symmetric 5 x  5 power.   Data Reviewed: I have personally reviewed following labs and imaging studies  CBC: Recent Labs  Lab 02/11/22 0423  WBC 4.9  NEUTROABS 2.3  HGB 10.0*  HCT 30.8*  MCV 84.4  PLT 258   Basic Metabolic Panel: Recent Labs  Lab 02/11/22 0423  NA 139  K 4.3  CL 104  CO2 29  GLUCOSE 125*  BUN 26*  CREATININE 0.91  CALCIUM 9.3  MG 1.8  PHOS 4.8*   GFR: Estimated Creatinine Clearance: 98.3 mL/min (by C-G formula based on SCr of 0.91 mg/dL). Liver Function Tests: Recent Labs  Lab 02/11/22 0423  AST 14*  ALT 9  ALKPHOS 94  BILITOT 0.2*  PROT 7.8  ALBUMIN 2.9*   No results for input(s): "LIPASE", "AMYLASE" in the last 168 hours. No results for input(s): "AMMONIA" in the last 168 hours. Coagulation Profile: No results for input(s): "INR", "PROTIME" in the last 168 hours. Cardiac Enzymes: No results for input(s): "CKTOTAL", "CKMB", "CKMBINDEX", "TROPONINI" in the last 168 hours. BNP (last 3 results) No results for input(s): "PROBNP" in the last 8760 hours. HbA1C: No results for input(s): "HGBA1C" in the last 72 hours. CBG: Recent Labs  Lab 02/13/22 2140 02/14/22 0739 02/14/22 1150 02/14/22 1639 02/14/22 2101  GLUCAP 151* 162* 139* 132* 75   Lipid Profile: No results for input(s): "CHOL", "HDL", "LDLCALC", "TRIG", "CHOLHDL", "LDLDIRECT" in the last 72 hours. Thyroid Function Tests: No results for input(s): "TSH", "T4TOTAL", "FREET4", "T3FREE", "THYROIDAB" in the last 72 hours. Anemia Panel: No results for input(s): "VITAMINB12", "FOLATE", "FERRITIN", "TIBC", "IRON", "RETICCTPCT" in the last 72 hours. Sepsis Labs: No results for input(s): "PROCALCITON", "LATICACIDVEN" in the last 168 hours.  No results found for this or any previous visit (from the past 240 hour(s)).       Radiology Studies: No results found.      Scheduled Meds:  amLODipine  5 mg Oral Daily   celecoxib  200 mg Oral BID   cyclobenzaprine  10 mg Oral TID   enoxaparin  (LOVENOX) injection  40 mg Subcutaneous Q24H   feeding supplement (GLUCERNA SHAKE)  237 mL Oral BID BM   gabapentin  400 mg Oral TID   insulin aspart  0-15 Units Subcutaneous TID WC   insulin aspart  0-5 Units Subcutaneous QHS   insulin aspart  7 Units Subcutaneous TID WC   insulin glargine-yfgn  14 Units Subcutaneous Daily   insulin glargine-yfgn  20 Units Subcutaneous QHS   melatonin  5 mg Oral QHS   multivitamin with minerals  1 tablet Oral Daily   naloxegol oxalate  25 mg Oral Daily   nicotine  21 mg Transdermal Daily   pantoprazole  40 mg Oral Daily   pneumococcal 20-valent conjugate vaccine  0.5 mL Intramuscular Tomorrow-1000   Ensure Max Protein  11 oz Oral BID   senna-docusate  1 tablet Oral BID   Continuous Infusions:   ceFAZolin (ANCEF) IV 2 g (02/15/22 0535)     LOS: 29 days    Time spent: 35 minutes.     Alba Cory, MD [facility]   If 7PM-7AM, please contact night-coverage [email or web address]  02/15/2022, 7:27 AM

## 2022-02-15 NOTE — Progress Notes (Signed)
Inpatient Diabetes Program Recommendations  AACE/ADA: New Consensus Statement on Inpatient Glycemic Control (2015)  Target Ranges:  Prepandial:   less than 140 mg/dL      Peak postprandial:   less than 180 mg/dL (1-2 hours)      Critically ill patients:  140 - 180 mg/dL   Lab Results  Component Value Date   GLUCAP 152 (H) 02/15/2022   HGBA1C 10.8 (H) 01/18/2022    Review of Glycemic Control  Diabetes history: DM2 Outpatient Diabetes medications: metformin 1000 mg BID (not taking) Current orders for Inpatient glycemic control: Semglee 14 in am and 20 QHS, Novolog 0-15 TID with meals and 0-5 HS + 7 units TID  HgbA1C - 10.8%  Inpatient Diabetes Program Recommendations:    Consult for pt requesting larger portions of food. Per RD, extra portion sizes ordered with CHO mod diet.   Spoke with pt at bedside about HgbA1C of 10.8%. Pt states he usually eats just 2 meals/day and does not monitor blood sugars. Discussed basic pathophysiology of DM Type 2, basic home care, importance of checking CBGs and maintaining good CBG control to prevent long-term and short-term complications. Reviewed glucose and A1C goals, discussed hypoglycemia s/s and treatment. Discussed impact of nutrition, exercise, stress, sickness, and medications on diabetes control. Ordered Living Well with diabetes booklet and encouraged patient to read through entire book. Answered all questions. Pt states he will f/u with PCP.  Thank you. Ailene Ards, RD, LDN, CDE Inpatient Diabetes Coordinator [phone]

## 2022-02-16 LAB — GLUCOSE, CAPILLARY
Glucose-Capillary: 237 mg/dL — ABNORMAL HIGH (ref 70–99)
Glucose-Capillary: 130 mg/dL — ABNORMAL HIGH (ref 70–99)
Glucose-Capillary: 152 mg/dL — ABNORMAL HIGH (ref 70–99)
Glucose-Capillary: 143 mg/dL — ABNORMAL HIGH (ref 70–99)

## 2022-02-16 MED ORDER — MAGIC MOUTHWASH
5.0000 mL | Freq: Three times a day (TID) | ORAL | Status: DC
  Administered 2022-02-16 – 2022-02-19 (×10): 5 mL via ORAL
  Filled 2022-02-16 (×12): qty 5

## 2022-02-16 MED ORDER — BIOTENE DRY MOUTH MT LIQD: 15.0000 mL | OROMUCOSAL | Status: DC | PRN

## 2022-02-16 NOTE — Progress Notes (Signed)
PROGRESS NOTE    Tanner [name]  VCB:[id number] DOB: 01-Feb-1966 DOA: 01/17/2022 PCP: Patient, No Pcp Per   Brief Narrative: 56 year old M with PMH of DM-2, HTN, polysubstance use including IVDU with crystal meth, tobacco use disorder and hepatitis C presenting with back pain and found to have MSSA bacteremia with tricuspid valve endocarditis, septic emboli to the lungs, septic arthritis/osteomyelitis of L4-5 facet, epidural inflammation in L4-S1 as well as left erector spinae muscle abscess.  He underwent US-guided drainage of left erector spinae abscess on 02/02/2022.  ID recommends antibiotics for 6 weeks preferably with IV Ancef versus IV followed by oritavancin and oral therapy.      Assessment & Plan:   Principal Problem:   MSSA bacteremia Active Problems:   Septic arthritis of lumbar spine (HCC)   IVDU (intravenous drug user)   Osteomyelitis of lumbar spine (HCC)   Left erector spinae muscle abscess   Infective endocarditis of tricuspid valve   Chronic pain   Insomnia   Controlled type 2 diabetes mellitus without complication, without long-term current use of insulin (HCC)   Essential hypertension   Constipation   Hypocalcemia   Hyponatremia   Hypomagnesemia   Tobacco abuse   Normocytic anemia   Acute low back pain   Osteomyelitis (HCC)   Paraspinal abscess (HCC)   Delusion (HCC)  1-MSSA bacteremia, pulmonary septic emboli Blood culture 6/04 positive for MSSA, repeated 6/6 clear.  Plan to repeat MRI 2-4 weeks after initial 6/18 Plan for 6 weeks of total antibiotics.  Tricuspid valve vegetation on TEE.  On Ancef.   Septic arthritis of the lumbar spine osteomyelitis of the lumbar spine left erector spinae muscle abscess paraspinal abscess: S-p guide aspiration 6/20. MRI repeated as recommended by ID on 7/03: Decreased size of left dorsal paraspinous abscess. 2. Unchanged appearance of edema within the left posterior elements of L4 and L5. No new site of  osteomyelitis. ID informed of MRI results. Plan to continue with IV Cefazolin and ID will follow up on patient tomorrow to determine next step in treatment.   Uncontrolled diabetes type 2, insulin-dependent: Continue with Semglee BID.  Meals coverage.   Hypocalcemia: Resolved.  Normocytic anemia: Hb stable.  Hyponatremia: Resolved.  Hypertension: Continue with Norvasc.  Hypomagnesemia: Resolved.  IVDU; counseling. Needs to remain in hospital for IV antibiotics.  Chronic pain:oxycodone PRN Constipation: miralax PRN Delusional/insomnia:  Nutrition Problem: Increased nutrient needs Etiology: acute illness (polysubstance abuse)    Signs/Symptoms: estimated needs    Interventions: Glucerna shake, Snacks, Premier Protein  Estimated body mass index is 24.52 kg/m as calculated from the following:   Height as of this encounter: 6' (1.829 m).   Weight as of this encounter: 82 kg.   DVT prophylaxis: Lovenox Code Status: full code Family Communication: care discussed with patient.  Disposition Plan:  Status is: Inpatient Remains inpatient appropriate because: needs IV antibiotics    Consultants:  ID  Procedures:  S-p guide aspiration 6/20.  Antimicrobials:  Ancef  Subjective: He complaints of tooth pain. Oral mouth wash ordered. He will need to follow up with his dentist   Objective: Vitals:   02/15/22 0630 02/15/22 1400 02/15/22 2134 02/16/22 0439  BP: 135/83 137/77 126/69 (!) 122/54  Pulse: 73 89 87 78  Resp: 17 18 20 18   Temp: 98.2 F (36.8 C) 98.2 F (36.8 C) 98.1 F (36.7 C) 97.7 F (36.5 C)  TempSrc: Oral Oral Oral Oral  SpO2: 97% 99% 97% 99%  Weight:  Height:        Intake/Output Summary (Last 24 hours) at 02/16/2022 0710 Last data filed at 02/16/2022 7035 Gross per 24 hour  Intake 2360 ml  Output 2150 ml  Net 210 ml    Filed Weights   01/17/22 0550 02/08/22 1600  Weight: 77 kg 82 kg    Examination:  General exam: NAD Respiratory  system: CTA Cardiovascular system: S 1, S 2 RRR Central nervous system: Alert and oriented.    Data Reviewed: I have personally reviewed following labs and imaging studies  CBC: Recent Labs  Lab 02/11/22 0423 02/15/22 0759  WBC 4.9 7.0  NEUTROABS 2.3  --   HGB 10.0* 10.6*  HCT 30.8* 33.3*  MCV 84.4 84.7  PLT 258 273    Basic Metabolic Panel: Recent Labs  Lab 02/11/22 0423 02/15/22 0759  NA 139 137  K 4.3 4.0  CL 104 102  CO2 29 27  GLUCOSE 125* 124*  BUN 26* 27*  CREATININE 0.91 0.79  CALCIUM 9.3 9.2  MG 1.8 1.9  PHOS 4.8*  --     GFR: Estimated Creatinine Clearance: 111.8 mL/min (by C-G formula based on SCr of 0.79 mg/dL). Liver Function Tests: Recent Labs  Lab 02/11/22 0423  AST 14*  ALT 9  ALKPHOS 94  BILITOT 0.2*  PROT 7.8  ALBUMIN 2.9*    No results for input(s): "LIPASE", "AMYLASE" in the last 168 hours. No results for input(s): "AMMONIA" in the last 168 hours. Coagulation Profile: No results for input(s): "INR", "PROTIME" in the last 168 hours. Cardiac Enzymes: No results for input(s): "CKTOTAL", "CKMB", "CKMBINDEX", "TROPONINI" in the last 168 hours. BNP (last 3 results) No results for input(s): "PROBNP" in the last 8760 hours. HbA1C: No results for input(s): "HGBA1C" in the last 72 hours. CBG: Recent Labs  Lab 02/15/22 0756 02/15/22 1129 02/15/22 1717 02/15/22 2050 02/16/22 0707  GLUCAP 166* 70 196* 152* 237*    Lipid Profile: No results for input(s): "CHOL", "HDL", "LDLCALC", "TRIG", "CHOLHDL", "LDLDIRECT" in the last 72 hours. Thyroid Function Tests: No results for input(s): "TSH", "T4TOTAL", "FREET4", "T3FREE", "THYROIDAB" in the last 72 hours. Anemia Panel: No results for input(s): "VITAMINB12", "FOLATE", "FERRITIN", "TIBC", "IRON", "RETICCTPCT" in the last 72 hours. Sepsis Labs: No results for input(s): "PROCALCITON", "LATICACIDVEN" in the last 168 hours.  No results found for this or any previous visit (from the past 240  hour(s)).       Radiology Studies: MR Lumbar Spine W Wo Contrast  Result Date: 02/16/2022 CLINICAL DATA:  Lumbar osteomyelitis EXAM: MRI LUMBAR SPINE WITHOUT AND WITH CONTRAST TECHNIQUE: Multiplanar and multiecho pulse sequences of the lumbar spine were obtained without and with intravenous contrast. CONTRAST:  23mL GADAVIST GADOBUTROL 1 MMOL/ML IV SOLN COMPARISON:  01/31/2022 FINDINGS: Segmentation:  Standard. Alignment:  Physiologic. Vertebrae: There is persistent edema within the left posterior elements of L4 and L5. No new finding of osteomyelitis. Conus medullaris and cauda equina: Conus extends to the L1 level. Conus and cauda equina appear normal. Paraspinal and other soft tissues: The left dorsal paraspinous abscess has decreased in size. Disc levels: Small right subarticular disc protrusion at L5-S1 is unchanged. There is no spinal canal or neural foraminal stenosis. The other disc levels are unremarkable. IMPRESSION: 1. Decreased size of left dorsal paraspinous abscess. 2. Unchanged appearance of edema within the left posterior elements of L4 and L5. No new site of osteomyelitis. Electronically Signed   By: Deatra Robinson M.D.   On: 02/16/2022 00:38  Scheduled Meds:  amLODipine  5 mg Oral Daily   celecoxib  200 mg Oral BID   cyclobenzaprine  10 mg Oral TID   enoxaparin (LOVENOX) injection  40 mg Subcutaneous Q24H   feeding supplement (GLUCERNA SHAKE)  237 mL Oral BID BM   gabapentin  400 mg Oral TID   insulin aspart  0-15 Units Subcutaneous TID WC   insulin aspart  0-5 Units Subcutaneous QHS   insulin aspart  7 Units Subcutaneous TID WC   insulin glargine-yfgn  14 Units Subcutaneous Daily   insulin glargine-yfgn  20 Units Subcutaneous QHS   living well with diabetes book   Does not apply Once   melatonin  5 mg Oral QHS   multivitamin with minerals  1 tablet Oral Daily   naloxegol oxalate  25 mg Oral Daily   nicotine  21 mg Transdermal Daily   pantoprazole  40 mg Oral  Daily   pneumococcal 20-valent conjugate vaccine  0.5 mL Intramuscular Tomorrow-1000   Ensure Max Protein  11 oz Oral BID   senna-docusate  1 tablet Oral BID   Continuous Infusions:   ceFAZolin (ANCEF) IV 2 g (02/16/22 0508)     LOS: 30 days    Time spent: 35 minutes.     Alba Cory, MD [facility]   If 7PM-7AM, please contact night-coverage [email or web address]  02/16/2022, 7:10 AM

## 2022-02-16 NOTE — Progress Notes (Signed)
Id brief note   Mri lumbar spine reviewed IMPRESSION: 1. Decreased size of left dorsal paraspinous abscess. 2. Unchanged appearance of edema within the left posterior elements of L4 and L5. No new site of osteomyelitis.    Mssa septicemia Tv endocarditis Lumbar spine om and paraspinal abscess   Clinically no fever Wbc normal Overall appears to be stable/improving   On abx since 6/4  -will leave to main ID team tomorrow to decide if appropriate to discharge with oral abx or keep on iv -continue current cefazolin for now

## 2022-02-17 LAB — GLUCOSE, CAPILLARY
Glucose-Capillary: 154 mg/dL — ABNORMAL HIGH (ref 70–99)
Glucose-Capillary: 96 mg/dL (ref 70–99)
Glucose-Capillary: 135 mg/dL — ABNORMAL HIGH (ref 70–99)
Glucose-Capillary: 105 mg/dL — ABNORMAL HIGH (ref 70–99)

## 2022-02-17 MED ORDER — GLUCERNA SHAKE PO LIQD
237.0000 mL | Freq: Two times a day (BID) | ORAL | Status: DC
  Administered 2022-02-17 – 2022-02-19 (×3): 237 mL via ORAL
  Filled 2022-02-17 (×8): qty 237

## 2022-02-17 NOTE — Progress Notes (Signed)
ID brief note  02/15/22  MRI L spine reviewed. Spoke to Dr Renette Butters Radiology , size of left dorsal paraspinous abscess ( 1.9*0.8*7.1cm)   I consulted IR evaluation for possible drainage of the left dorsal paraspinous abscess. They however want Neurosurgery evaluation prior to IR guided drainage of the abscess. Dr Dolphus Jenny is going to consult Neurosurgery.  Following.  Odette Fraction, MD Infectious Disease Physician [facility] [address] [place], [place] [address] Phone: [phone]  Fax: [phone]'

## 2022-02-17 NOTE — Progress Notes (Signed)
Nutrition Follow-up  INTERVENTION:   -Ensure MAX Protein po BID, each supplement provides 150 kcal and 30 grams of protein   -Glucerna Shake po BID, each supplement provides 220 kcal and 10 grams of protein   -1 cup chocolate ice cream/day.  NUTRITION DIAGNOSIS:   Increased nutrient needs related to acute illness (polysubstance abuse) as evidenced by estimated needs.  Ongoing.  GOAL:   Patient will meet greater than or equal to 90% of their needs  Progressing.  MONITOR:   PO intake, Supplement acceptance, Labs, Weight trends  ASSESSMENT:   56 year old male, single, independent, currently unemployed, medical history significant for polysubstance abuse (tobacco, IVDA-crystal meth), type II DM, HTN, hepatitis C, cellulitis of right foot, opioid dependence, periodontal disease presented to the ED for the second time in a week with complaints of progressively worsening low back pain x2 weeks.  MRI L-spine confirms L4-5 facet septic arthritis with associated osteomyelitis and multiloculated left erector spinae muscle abscess (12 mL).  Patient requires 6 weeks of admission d/t IV abx need.  6/20: s/p US guided paraspinal abscess aspiration  Patient currently consuming 100% of meals.  Accepting most protein supplements.  Extra portion sizes ordered per MD to help meet calorie needs.  Admission weight: 169 lbs. Last weight 6/26: 180 lbs.  Medications: Magic Mouthwash, Multivitamin with minerals daily, Senokot  Labs reviewed: CBGs: 96-237  Diet Order:   Diet Order             Diet Carb Modified Fluid consistency: Thin; Room service appropriate? Yes with Assist  Diet effective now                   EDUCATION NEEDS:   Education needs have been addressed on 6/19 & 6/26  Skin:  Skin Assessment: Skin Integrity Issues: Skin Integrity Issues:: Other (Comment) Other: cellulitis of right foot  Last BM:  7/4  Height:   Ht Readings from Last 1 Encounters:  01/17/22  6' (1.829 m)    Weight:   Wt Readings from Last 1 Encounters:  02/08/22 82 kg    BMI:  Body mass index is 24.52 kg/m.  Estimated Nutritional Needs:   Kcal:  2300-2500  Protein:  115-130g  Fluid:  2.3L/day  Tilda Franco, MS, RD, LDN Inpatient Clinical Dietitian Contact information available via Amion

## 2022-02-17 NOTE — Progress Notes (Signed)
Patient ID: Tanner [name], male   DOB: Jul 25, 1966, 56 y.o.   MRN: [id number] Request received for asp/drainage of lumbar paraspinal region fluid collection. Imaging studies were reviewed by Dr. Grace Isaac. Pt has had area under question since 6/18 with previous aspiration on 6/20; cx with few staph; latest MRI of region noting decreased size of abscess; per Dr. Grace Isaac rec that primary team reach out to neurosurgery first for consideration of debridement; if NS declines then may need to consider drain placement

## 2022-02-17 NOTE — Progress Notes (Signed)
Tanner [name]  DZH:[id number] DOB: [birth year] DOA: 01/17/2022 PCP: Patient, No Pcp Per    Brief Narrative:  56 year old with a history of DM2, HTN, IV crystal meth abuse, tobacco abuse, and hepatitis C who presented to the hospital 01/17/2022 with back pain and was found to have MSSA bacteremia with tricuspid valve endocarditis, septic arthritis/osteomyelitis of L4-L5, epidural inflammation at L4-S1, left erector spinae muscle abscess, and septic emboli to bilateral lungs.  He underwent ultrasound-guided drainage of the left erector spinae abscess 02/02/2022.  ID has been guiding antibiotic therapy with a plan for 6 weeks IV Ancef to be followed by oral therapy at time of discharge.  Consultants:  ID  Goals of Care:  Code Status: Full Code   DVT prophylaxis: Lovenox  Interim Hx: Patient is sitting up in a bedside chair.  He reports that he is feeling much better overall.  He denies severe pain in the back.  He denies fevers or chills.  He reports significant improvement in his appetite and intake.  He is moving his bowels.  Assessment & Plan:  MSSA bacteremia Initial culture positive 6/4 -follow-up 6/6 clear - to complete 6 weeks of antibiotics total -antibiotics per ID  Tricuspid valve MSSA vegetation Noted on TEE - care as per above  Septic arthritis/osteomyelitis of the lumbar spine  Left erector spinae muscle abscess/paraspinal abscess Status post guided aspiration 6/20 - repeat MRI 7/3 noted decreased size of the left paraspinal abscess and unchanged appearance of edema within L4 and L5 - area under question first noted 6/18 -aspiration of this region accomplished 6/20 revealing few staph -repeat MRI 7/3 noted decreasing size of abscess -ID has requested repeat drainage of this area by IR, and IR has requested Neurosurgery re-evaluation prior to considering this   Uncontrolled DM2 with hyperglycemia A1c 10.8 -does essentially nothing to manage or monitor his diabetes at home -CBG  presently controlled  Hypocalcemia Resolved  Normocytic anemia Due to above -hemoglobin stable  HTN Blood pressure controlled  Hyponatremia Likely hypovolemic in etiology -resolved  Hypomagnesemia Due to poor nutrition -corrected with supplementation  IV drug abuse Has been counseled on absolute need for ongoing abstention   Family Communication: No family present at time of exam Disposition: From home -anticipate discharge home when able to be transitioned to oral antibiotic   Objective: Blood pressure 126/79, pulse 73, temperature 97.6 F (36.4 C), temperature source Oral, resp. rate 18, height 6' (1.829 m), weight 82 kg, SpO2 98 %.  Intake/Output Summary (Last 24 hours) at 02/17/2022 0943 Last data filed at 02/17/2022 0718 Gross per 24 hour  Intake 2340 ml  Output 1300 ml  Net 1040 ml   Filed Weights   01/17/22 0550 02/08/22 1600  Weight: 77 kg 82 kg    Examination: General: No acute respiratory distress Lungs: Clear to auscultation bilaterally without wheezes or crackles Cardiovascular: Regular rate and rhythm without murmur gallop or rub normal S1 and S2 Abdomen: Nontender, nondistended, soft, bowel sounds positive, no rebound, no ascites, no appreciable mass Extremities: No significant cyanosis, clubbing, or edema bilateral lower extremities  CBC: Recent Labs  Lab 02/11/22 0423 02/15/22 0759  WBC 4.9 7.0  NEUTROABS 2.3  --   HGB 10.0* 10.6*  HCT 30.8* 33.3*  MCV 84.4 84.7  PLT 258 273   Basic Metabolic Panel: Recent Labs  Lab 02/11/22 0423 02/15/22 0759  NA 139 137  K 4.3 4.0  CL 104 102  CO2 29 27  GLUCOSE 125* 124*  BUN 26*  27*  CREATININE 0.91 0.79  CALCIUM 9.3 9.2  MG 1.8 1.9  PHOS 4.8*  --    GFR: Estimated Creatinine Clearance: 111.8 mL/min (by C-G formula based on SCr of 0.79 mg/dL).  Liver Function Tests: Recent Labs  Lab 02/11/22 0423  AST 14*  ALT 9  ALKPHOS 94  BILITOT 0.2*  PROT 7.8  ALBUMIN 2.9*    HbA1C: Hgb  A1c MFr Bld  Date/Time Value Ref Range Status  01/18/2022 03:31 AM 10.8 (H) 4.8 - 5.6 % Final    Comment:    (NOTE) Pre diabetes:          5.7%-6.4%  Diabetes:              >6.4%  Glycemic control for   <7.0% adults with diabetes   05/18/2019 05:45 AM 8.1 (H) 4.8 - 5.6 % Final    Comment:    (NOTE) Pre diabetes:          5.7%-6.4% Diabetes:              >6.4% Glycemic control for   <7.0% adults with diabetes     CBG: Recent Labs  Lab 02/16/22 0707 02/16/22 1144 02/16/22 1623 02/16/22 2047 02/17/22 0716  GLUCAP 237* 130* 152* 143* 154*    Scheduled Meds:  amLODipine  5 mg Oral Daily   celecoxib  200 mg Oral BID   cyclobenzaprine  10 mg Oral TID   enoxaparin (LOVENOX) injection  40 mg Subcutaneous Q24H   feeding supplement (GLUCERNA SHAKE)  237 mL Oral BID BM   gabapentin  400 mg Oral TID   insulin aspart  0-15 Units Subcutaneous TID WC   insulin aspart  0-5 Units Subcutaneous QHS   insulin aspart  7 Units Subcutaneous TID WC   insulin glargine-yfgn  14 Units Subcutaneous Daily   insulin glargine-yfgn  20 Units Subcutaneous QHS   living well with diabetes book   Does not apply Once   magic mouthwash  5 mL Oral TID   melatonin  5 mg Oral QHS   multivitamin with minerals  1 tablet Oral Daily   naloxegol oxalate  25 mg Oral Daily   nicotine  21 mg Transdermal Daily   pantoprazole  40 mg Oral Daily   pneumococcal 20-valent conjugate vaccine  0.5 mL Intramuscular Tomorrow-1000   Ensure Max Protein  11 oz Oral BID   senna-docusate  1 tablet Oral BID   Continuous Infusions:   ceFAZolin (ANCEF) IV 2 g (02/17/22 0509)     LOS: 31 days   Lonia Blood, MD [facility] Office  [phone] Pager - Text Page per Loretha Stapler  If 7PM-7AM, please contact night-coverage per Amion 02/17/2022, 9:43 AM

## 2022-02-18 LAB — GLUCOSE, CAPILLARY
Glucose-Capillary: 210 mg/dL — ABNORMAL HIGH (ref 70–99)
Glucose-Capillary: 76 mg/dL (ref 70–99)
Glucose-Capillary: 147 mg/dL — ABNORMAL HIGH (ref 70–99)
Glucose-Capillary: 150 mg/dL — ABNORMAL HIGH (ref 70–99)

## 2022-02-18 MED ORDER — ENOXAPARIN SODIUM 40 MG/0.4ML IJ SOSY
40.0000 mg | PREFILLED_SYRINGE | INTRAMUSCULAR | Status: DC
Start: 2022-02-20 — End: 2022-02-18

## 2022-02-18 NOTE — Progress Notes (Signed)
Referring Physician(s): Manandhar,S  Supervising Physician: Roanna Banning  Patient Status:  [name] - In-pt  Chief Complaint:  Back pain, persistent lumbar paraspinal abscess  Subjective: Patient known to IR service from aspiration of left paraspinal abscess on 02/02/2022.  Cultures at that time grew few pansensitive staph.  He has a past medical history significant for diabetes, hypertension, IV crystal meth abuse, tobacco abuse, hepatitis C, prior MSSA bacteremia with tricuspid valve endocarditis, septic arthritis/osteomyelitis of L4-5, epidural inflammation and L4 S1, septic emboli to bilateral lungs and left erector spinae muscle abscess.  Latest follow-up MRI performed on 02/16/2022 revealed decreased size of left dorsal paraspinous abscess with unchanged edema within the left posterior elements of L4 and L5.  No new site of osteomyelitis.  Patient was seen by ID who now recommends repeat aspiration /possible drainage of the left paraspinous abscess.  Neurosurgery also consulted and they feel that there is no role for surgical intervention/debridement at this time.  Patient currently denies fever, headache, chest pain, dyspnea, cough, abdominal pain, nausea, vomiting or bleeding.  Does have some back pain but has improved.  Past Medical History:  Diagnosis Date   Cellulitis of right foot 05/17/2019   Diabetes mellitus without complication (HCC)    Drug abuse (HCC)    Essential hypertension 09/20/2017   Formatting of this note might be different from the original. Stable with current therapy; continue  Last Assessment & Plan:  Formatting of this note might be different from the original.   Hepatitis C antibody positive in blood 10/05/2017   Formatting of this note might be different from the original. 10/05/17: ab +, RNA neg on 2/12 labs. Likely past infection that is resolved. F/u prn.   History of vitamin D deficiency 09/20/2017   Formatting of this note might be different from the original.  09/20/17: per pt; labs 1 wk; supplement prn.   Opioid dependence, uncomplicated (HCC) 02/24/2018   Formatting of this note might be different from the original. Self-medicating with buprenorphine Formatting of this note might be different from the original. Self-medicating with buprenorphine   Periodontal disease 10/11/2017   Formatting of this note might be different from the original. 10/11/17: refer to dental   Tobacco abuse    Type 2 diabetes mellitus with hyperglycemia (HCC) 01/17/2022   Past Surgical History:  Procedure Laterality Date   SKIN GRAFT        Allergies: Trazodone and nefazodone  Medications: Prior to Admission medications   Medication Sig Start Date End Date Taking? Authorizing Provider  cyclobenzaprine (FLEXERIL) 10 MG tablet Take 1 tablet (10 mg total) by mouth 2 (two) times daily as needed for muscle spasms. Patient not taking: Reported on 01/17/2022 01/11/22   Tegeler, Canary Brim, MD  HYDROcodone-acetaminophen (NORCO/VICODIN) 5-325 MG tablet Take 2 tablets by mouth every 4 (four) hours as needed. Patient not taking: Reported on 01/17/2022 10/21/21   Roemhildt, Lorin T, PA-C  lidocaine (LIDODERM) 5 % Place 1 patch onto the skin daily. Remove & Discard patch within 12 hours or as directed by MD Patient not taking: Reported on 01/17/2022 01/11/22   Tegeler, Canary Brim, MD  metFORMIN (GLUCOPHAGE) 1000 MG tablet Take 1 tablet (1,000 mg total) by mouth 2 (two) times daily. Patient not taking: Reported on 01/17/2022 08/22/19 09/23/28  Nira Conn, MD  naloxone [facility]) 0.4 MG/ML injection Use per directions Patient not taking: Reported on 01/17/2022 09/24/19   Mesner, Barbara Cower, MD     Vital Signs: BP (!) 147/89 (BP  Location: Right Arm)   Pulse 71   Temp (!) 97.5 F (36.4 C) (Oral)   Resp 20   Ht 6' (1.829 m)   Wt 180 lb 12.8 oz (82 kg)   SpO2 99%   BMI 24.52 kg/m   Physical Exam awake, alert.  Chest clear to auscultation bilaterally.  Heart with regular rate and  rhythm.  Abdomen soft, positive bowel sounds, nontender.  No significant lower extremity edema.  Some mild left flank tenderness to palpation.  Imaging: MR Lumbar Spine W Wo Contrast  Result Date: 02/16/2022 CLINICAL DATA:  Lumbar osteomyelitis EXAM: MRI LUMBAR SPINE WITHOUT AND WITH CONTRAST TECHNIQUE: Multiplanar and multiecho pulse sequences of the lumbar spine were obtained without and with intravenous contrast. CONTRAST:  6mL GADAVIST GADOBUTROL 1 MMOL/ML IV SOLN COMPARISON:  01/31/2022 FINDINGS: Segmentation:  Standard. Alignment:  Physiologic. Vertebrae: There is persistent edema within the left posterior elements of L4 and L5. No new finding of osteomyelitis. Conus medullaris and cauda equina: Conus extends to the L1 level. Conus and cauda equina appear normal. Paraspinal and other soft tissues: The left dorsal paraspinous abscess has decreased in size. Disc levels: Small right subarticular disc protrusion at L5-S1 is unchanged. There is no spinal canal or neural foraminal stenosis. The other disc levels are unremarkable. IMPRESSION: 1. Decreased size of left dorsal paraspinous abscess. 2. Unchanged appearance of edema within the left posterior elements of L4 and L5. No new site of osteomyelitis. Electronically Signed   By: Deatra Robinson M.D.   On: 02/16/2022 00:38    Labs:  CBC: Recent Labs    02/03/22 0509 02/05/22 0501 02/11/22 0423 02/15/22 0759  WBC 5.5 5.3 4.9 7.0  HGB 10.4* 10.6* 10.0* 10.6*  HCT 32.6* 32.5* 30.8* 33.3*  PLT 358 328 258 273    COAGS: Recent Labs    01/17/22 1305  INR 1.1  APTT 31    BMP: Recent Labs    02/03/22 0509 02/05/22 0501 02/11/22 0423 02/15/22 0759  NA 138 136 139 137  K 4.9 4.3 4.3 4.0  CL 99 98 104 102  CO2 33* 31 29 27   GLUCOSE 118* 129* 125* 124*  BUN 31* 23* 26* 27*  CALCIUM 9.7 9.5 9.3 9.2  CREATININE 0.86 0.73 0.91 0.79  GFRNONAA >60 >60 >60 >60    LIVER FUNCTION TESTS: Recent Labs    01/17/22 0722 01/18/22 0331  01/21/22 0439 02/05/22 0501 02/11/22 0423  BILITOT 0.8 0.8 0.3  --  0.2*  AST 16 15 14*  --  14*  ALT 16 15 12   --  9  ALKPHOS 109 108 105  --  94  PROT 6.8 6.1* 6.5  --  7.8  ALBUMIN 2.7* 2.4* 2.4* 2.9* 2.9*    Assessment and Plan: Patient known to IR service from aspiration of left paraspinal abscess on 02/02/2022.  Cultures at that time grew few pansensitive staph.  He has a past medical history significant for diabetes, hypertension, IV crystal meth abuse, tobacco abuse, hepatitis C, prior MSSA bacteremia with tricuspid valve endocarditis, septic arthritis/osteomyelitis of L4-5, epidural inflammation and L4 S1, septic emboli to bilateral lungs and left erector spinae muscle abscess.  Latest follow-up MRI performed on 02/16/2022 revealed decreased size of left dorsal paraspinous abscess with unchanged edema within the left posterior elements of L4 and L5.  No new site of osteomyelitis.  Patient was seen by ID who now recommends repeat aspiration /possible drainage of the left paraspinous abscess.  Neurosurgery also consulted and they  feel that there is no role for surgical intervention/debridement at this time.  Recent labs include normal WBC, hemoglobin 10.6, creat nl, normal platelets, PT/INR pending.  Imaging studies have been reviewed by Dr. Elby Showers.  Details/risks of procedure, including but not limited to, internal bleeding, infection, injury to adjacent structures, inability to place drain discussed with patient with his understanding and consent.  Procedure scheduled for 7/7.   Electronically Signed: D. Jeananne Rama, PA-C 02/18/2022, 12:30 PM   I spent a total of 25 Minutes at the the patient's bedside AND on the patient's hospital floor or unit, greater than 50% of which was counseling/coordinating care for image guided aspiration/possible drainage of left paraspinous abscess    Patient ID: Tanner [name], male   DOB: 1966-05-10, 56 y.o.   MRN: [id number]

## 2022-02-18 NOTE — Progress Notes (Signed)
Tanner [name]  OVF:[id number] DOB: Oct 03, 1965 DOA: 01/17/2022 PCP: Patient, No Pcp Per    Brief Narrative:  56 year old with a history of DM2, HTN, IV crystal meth abuse, tobacco abuse, and hepatitis C who presented to the hospital 01/17/2022 with back pain and was found to have MSSA bacteremia with tricuspid valve endocarditis, septic arthritis/osteomyelitis of L4-L5, epidural inflammation at L4-S1, left erector spinae muscle abscess, and septic emboli to bilateral lungs.  He underwent ultrasound-guided drainage of the left erector spinae abscess 02/02/2022.  ID has been guiding antibiotic therapy with a plan for 6 weeks IV Ancef to be followed by oral therapy at time of discharge.  Consultants:  ID  Goals of Care:  Code Status: Full Code   DVT prophylaxis: Lovenox  Interim Hx: No acute events reported overnight.  ID has asked IR to aspirate the left dorsal paraspinous abscess again.  The patient is afebrile.  Vital signs are otherwise stable.  Assessment & Plan:  MSSA bacteremia Initial culture positive 6/4 -follow-up 6/6 clear - to complete 6 weeks of antibiotics total -antibiotics per ID  Tricuspid valve MSSA vegetation Noted on TEE - care as per above  Septic arthritis/osteomyelitis of the lumbar spine - Left erector spinae muscle abscess/paraspinal abscess repeat MRI 7/3 noted decreased size of the left paraspinal abscess and unchanged appearance of edema within L4 and L5 - area under question first noted 6/18 - aspiration of this region accomplished 6/20 revealed few staph - repeat MRI 7/3 noted decreasing size of abscess - ID has requested repeat drainage of this area by IR  Uncontrolled DM2 with hyperglycemia A1c 10.8 -does essentially nothing to manage or monitor his diabetes at home - CBG presently controlled  Hypocalcemia Resolved  Normocytic anemia Due to above - hemoglobin stable  HTN Blood pressure controlled  Hyponatremia Likely hypovolemic in etiology  -resolved  Hypomagnesemia Due to poor nutrition -corrected with supplementation  IV drug abuse Has been counseled on absolute need for ongoing abstention   Family Communication: No family present at time of exam Disposition: From home - anticipate discharge home when able to be transitioned to oral antibiotic   Objective: Blood pressure (!) 147/89, pulse 71, temperature (!) 97.5 F (36.4 C), temperature source Oral, resp. rate 20, height 6' (1.829 m), weight 82 kg, SpO2 99 %.  Intake/Output Summary (Last 24 hours) at 02/18/2022 0849 Last data filed at 02/18/2022 0636 Gross per 24 hour  Intake 1420 ml  Output 3600 ml  Net -2180 ml    Filed Weights   01/17/22 0550 02/08/22 1600  Weight: 77 kg 82 kg    Examination: General: No acute respiratory distress Lungs: Clear to auscultation bilaterally without wheezes or crackles Cardiovascular: Regular rate and rhythm without murmur  Abdomen: Nontender, nondistended, soft, bowel sounds positive, no rebound, no ascites, no appreciable mass Extremities: No significant cyanosis, clubbing, or edema bilateral lower extremities  CBC: Recent Labs  Lab 02/15/22 0759  WBC 7.0  HGB 10.6*  HCT 33.3*  MCV 84.7  PLT 273    Basic Metabolic Panel: Recent Labs  Lab 02/15/22 0759  NA 137  K 4.0  CL 102  CO2 27  GLUCOSE 124*  BUN 27*  CREATININE 0.79  CALCIUM 9.2  MG 1.9    GFR: Estimated Creatinine Clearance: 111.8 mL/min (by C-G formula based on SCr of 0.79 mg/dL).  Scheduled Meds:  amLODipine  5 mg Oral Daily   celecoxib  200 mg Oral BID   cyclobenzaprine  10 mg Oral  TID   enoxaparin (LOVENOX) injection  40 mg Subcutaneous Q24H   feeding supplement (GLUCERNA SHAKE)  237 mL Oral BID BM   gabapentin  400 mg Oral TID   insulin aspart  0-15 Units Subcutaneous TID WC   insulin aspart  0-5 Units Subcutaneous QHS   insulin aspart  7 Units Subcutaneous TID WC   insulin glargine-yfgn  14 Units Subcutaneous Daily   insulin  glargine-yfgn  20 Units Subcutaneous QHS   living well with diabetes book   Does not apply Once   magic mouthwash  5 mL Oral TID   melatonin  5 mg Oral QHS   multivitamin with minerals  1 tablet Oral Daily   naloxegol oxalate  25 mg Oral Daily   nicotine  21 mg Transdermal Daily   pantoprazole  40 mg Oral Daily   pneumococcal 20-valent conjugate vaccine  0.5 mL Intramuscular Tomorrow-1000   Ensure Max Protein  11 oz Oral BID   senna-docusate  1 tablet Oral BID   Continuous Infusions:   ceFAZolin (ANCEF) IV 2 g (02/18/22 0600)     LOS: 32 days   Lonia Blood, MD [facility] Office  [phone] Pager - Text Page per Loretha Stapler  If 7PM-7AM, please contact night-coverage per Amion 02/18/2022, 8:49 AM

## 2022-02-19 ENCOUNTER — Other Ambulatory Visit (HOSPITAL_COMMUNITY): Payer: Self-pay

## 2022-02-19 ENCOUNTER — Telehealth (HOSPITAL_COMMUNITY): Payer: Self-pay | Admitting: Pharmacy Technician

## 2022-02-19 ENCOUNTER — Inpatient Hospital Stay (HOSPITAL_COMMUNITY): Payer: Self-pay

## 2022-02-19 LAB — COMPREHENSIVE METABOLIC PANEL
ALT: 10 U/L (ref 0–44)
AST: 17 U/L (ref 15–41)
Albumin: 3 g/dL — ABNORMAL LOW (ref 3.5–5.0)
Alkaline Phosphatase: 91 U/L (ref 38–126)
Anion gap: 6 (ref 5–15)
BUN: 22 mg/dL — ABNORMAL HIGH (ref 6–20)
CO2: 30 mmol/L (ref 22–32)
Calcium: 9.2 mg/dL (ref 8.9–10.3)
Chloride: 102 mmol/L (ref 98–111)
Creatinine, Ser: 0.83 mg/dL (ref 0.61–1.24)
GFR, Estimated: >60 mL/min (ref >60)
Glucose, Bld: 130 mg/dL — ABNORMAL HIGH (ref 70–99)
Potassium: 4 mmol/L (ref 3.5–5.1)
Sodium: 138 mmol/L (ref 135–145)
Total Bilirubin: 0.3 mg/dL (ref 0.3–1.2)
Total Protein: 7.6 g/dL (ref 6.5–8.1)

## 2022-02-19 LAB — CBC
HCT: 31.5 % — ABNORMAL LOW (ref 39.0–52.0)
Hemoglobin: 10.1 g/dL — ABNORMAL LOW (ref 13.0–17.0)
MCH: 27.1 pg (ref 26.0–34.0)
MCHC: 32.1 g/dL (ref 30.0–36.0)
MCV: 84.5 fL (ref 80.0–100.0)
Platelets: 289 10*3/uL (ref 150–400)
RBC: 3.73 MIL/uL — ABNORMAL LOW (ref 4.22–5.81)
RDW: 14.2 % (ref 11.5–15.5)
WBC: 6.8 10*3/uL (ref 4.0–10.5)
nRBC: 0 % (ref 0.0–0.2)

## 2022-02-19 LAB — MAGNESIUM: Magnesium: 1.8 mg/dL (ref 1.7–2.4)

## 2022-02-19 LAB — GLUCOSE, CAPILLARY
Glucose-Capillary: 138 mg/dL — ABNORMAL HIGH (ref 70–99)
Glucose-Capillary: 82 mg/dL (ref 70–99)

## 2022-02-19 LAB — PROTIME-INR
INR: 1 (ref 0.8–1.2)
Prothrombin Time: 13 seconds (ref 11.4–15.2)

## 2022-02-19 MED ORDER — CEFADROXIL 500 MG PO CAPS
1000.0000 mg | ORAL_CAPSULE | Freq: Two times a day (BID) | ORAL | 0 refills | Status: AC
  Filled 2022-02-19: qty 84, 21d supply, fill #0

## 2022-02-19 MED ORDER — MIDAZOLAM HCL 2 MG/2ML IJ SOLN
INTRAMUSCULAR | Status: AC | PRN
  Administered 2022-02-19 (×2): 1 mg via INTRAVENOUS

## 2022-02-19 MED ORDER — GABAPENTIN 400 MG PO CAPS
400.0000 mg | ORAL_CAPSULE | Freq: Three times a day (TID) | ORAL | 2 refills | Status: DC
  Filled 2022-02-19: qty 90, 30d supply, fill #0

## 2022-02-19 MED ORDER — ORITAVANCIN DIPHOSPHATE 400 MG IV SOLR
1200.0000 mg | Freq: Once | INTRAVENOUS | Status: AC
Start: 2022-02-19 — End: 2022-02-19
  Administered 2022-02-19: 1200 mg via INTRAVENOUS
  Filled 2022-02-19: qty 120

## 2022-02-19 MED ORDER — POLYETHYLENE GLYCOL 3350 17 G PO PACK
17.0000 g | PACK | Freq: Two times a day (BID) | ORAL | 2 refills | Status: DC | PRN
  Filled 2022-02-19: qty 14, 7d supply, fill #0

## 2022-02-19 MED ORDER — BLOOD GLUCOSE METER KIT
PACK | 11 refills | Status: DC
  Filled 2022-02-19: qty 1, fill #0

## 2022-02-19 MED ORDER — SENNOSIDES-DOCUSATE SODIUM 8.6-50 MG PO TABS
1.0000 | ORAL_TABLET | Freq: Two times a day (BID) | ORAL | 2 refills | Status: DC
  Filled 2022-02-19: qty 60, 30d supply, fill #0

## 2022-02-19 MED ORDER — ACETAMINOPHEN 325 MG PO TABS
650.0000 mg | ORAL_TABLET | Freq: Four times a day (QID) | ORAL | 2 refills | Status: DC | PRN
  Filled 2022-02-19: qty 50, 7d supply, fill #0

## 2022-02-19 MED ORDER — FENTANYL CITRATE (PF) 100 MCG/2ML IJ SOLN
INTRAMUSCULAR | Status: AC | PRN
  Administered 2022-02-19 (×2): 50 ug via INTRAVENOUS

## 2022-02-19 MED ORDER — INSULIN ASPART 100 UNIT/ML FLEXPEN
7.0000 [IU] | PEN_INJECTOR | Freq: Three times a day (TID) | SUBCUTANEOUS | 11 refills | Status: DC
  Filled 2022-02-19: qty 6, 28d supply, fill #0

## 2022-02-19 MED ORDER — OXYCODONE-ACETAMINOPHEN 5-325 MG PO TABS: 1.0000 | ORAL_TABLET | ORAL | 0 refills | Status: DC | PRN

## 2022-02-19 MED ORDER — HYDROMORPHONE HCL 1 MG/ML IJ SOLN: 1.0000 mg | Freq: Once | INTRAMUSCULAR | Status: DC | PRN

## 2022-02-19 MED ORDER — CEFADROXIL 500 MG PO CAPS
1000.0000 mg | ORAL_CAPSULE | Freq: Two times a day (BID) | ORAL | Status: DC
Start: 2022-02-26 — End: 2022-02-20

## 2022-02-19 MED ORDER — INSULIN PEN NEEDLE 32G X 4 MM MISC
1.0000 [IU] | Freq: Every day | 11 refills | Status: DC
  Filled 2022-02-19: qty 100, 20d supply, fill #0

## 2022-02-19 MED ORDER — MIDAZOLAM HCL 2 MG/2ML IJ SOLN
INTRAMUSCULAR | Status: AC
  Filled 2022-02-19: qty 4

## 2022-02-19 MED ORDER — OXYCODONE-ACETAMINOPHEN 5-325 MG PO TABS
1.0000 | ORAL_TABLET | ORAL | 0 refills | Status: DC | PRN
  Filled 2022-02-19: qty 30, 3d supply, fill #0

## 2022-02-19 MED ORDER — CELECOXIB 200 MG PO CAPS
200.0000 mg | ORAL_CAPSULE | Freq: Two times a day (BID) | ORAL | 2 refills | Status: DC
  Filled 2022-02-19: qty 30, 15d supply, fill #0

## 2022-02-19 MED ORDER — CYCLOBENZAPRINE HCL 10 MG PO TABS
10.0000 mg | ORAL_TABLET | Freq: Three times a day (TID) | ORAL | 0 refills | Status: DC
  Filled 2022-02-19: qty 30, 10d supply, fill #0

## 2022-02-19 MED ORDER — FENTANYL CITRATE (PF) 100 MCG/2ML IJ SOLN
INTRAMUSCULAR | Status: AC
  Filled 2022-02-19: qty 2

## 2022-02-19 MED ORDER — LEVEMIR FLEXTOUCH 100 UNIT/ML ~~LOC~~ SOPN
PEN_INJECTOR | SUBCUTANEOUS | 11 refills | Status: DC
  Filled 2022-02-19: qty 9, 25d supply, fill #0

## 2022-02-19 NOTE — Procedures (Signed)
Vascular and Interventional Radiology Procedure Note  Patient: Tanner [name] DOB: [birth year] Medical Record Number: [id number] Note Date/Time: 02/19/22 11:30 AM   Performing Physician: Roanna Banning, MD Assistant(s): None  Diagnosis: Paraspinous abscess  Procedure: ASPIRATION OF LEFT PARASPINOUS ABSCESS  Anesthesia: Conscious Sedation Complications: None Estimated Blood Loss: Minimal Specimens: Sent for Gram Stain, Aerobe Culture, and Anerobe Culture  Findings:  Interval decreased size of L paraspinous abscess. Successful CT-guided aspiration with aspiration of 0.5 mL of fluid.   See detailed procedure note with images in PACS. The patient tolerated the procedure well without incident or complication and was returned to Floor Bed in stable condition.    Roanna Banning, MD [facility]   Pager. [phone] Clinic. [phone]

## 2022-02-19 NOTE — Discharge Summary (Signed)
DISCHARGE SUMMARY  Tanner [name]  MR#: [id number]  DOB:Jun 03, 1966  Date of Admission: 01/17/2022 Date of Discharge: 02/19/2022  Attending Physician:Ardelle Haliburton Hennie Duos, MD  Patient's TKP:[id number], No Pcp Per  Consults: ID Neurosurgery IR  Disposition: D/C home   Follow-up Appts:  Follow-up Information     Social Security Follow up.   Why: Call [phone] to find out what you will need in order to apply for disability. You will also need to ask what documentation is needed to request another social security card.        [place]. Call.   Why: Call [phone] to find out what you will need in order to apply for Medicaid.        Rosiland Oz, MD Follow up.   Specialty: Infectious Diseases Why: The clinic will call you to arrange a follow up visit. If you do not hear from them in the next 5-7 days, call the clinic to arrange the appointment. Contact information: [address] [place]  [address] [phone]                 Tests Needing Follow-up: -Assess patient for continued abstinence from IV drug abuse -Check CBG log and adjust insulin as necessary -Assure patient compliant with follow-up in ID clinic  Discharge Diagnoses: MSSA bacteremia Tricuspid valve MSSA vegetation/endocarditis  Septic arthritis/osteomyelitis of the lumbar spine - Left erector spinae muscle abscess/paraspinal abscess Uncontrolled DM2 with hyperglycemia Hypocalcemia Normocytic anemia HTN Hyponatremia Hypomagnesemia IV drug abuse  Initial presentation: 56 year old with a history of DM2, HTN, IV crystal meth abuse, tobacco abuse, and hepatitis C who presented to the hospital 01/17/2022 with back pain and was found to have MSSA bacteremia with tricuspid valve endocarditis, septic arthritis/osteomyelitis of L4-L5, epidural inflammation at L4-S1, left erector spinae muscle abscess, and septic emboli to bilateral lungs.    Hospital Course: He  underwent ultrasound-guided drainage of the left erector spinae abscess 02/02/2022, and again on 02/19/22.  The initial drainage produced a modest amount of fluid, and the second drainage almost none.  His hospital course has been prolonged to allow for observed IV antibiotics as it was not safe to discharge him home with an indwelling IV given his illicit IV drug abuse.  Following his repeat aspiration procedure 02/19/2022 he was cleared for discharge home with ID.  ID treatment course is as follows:  He has already received approximately a month of IV antibiotics. Plan for 1 dose of oritavancin followed by 3 more weeks of cefadroxil to be taken after a week of oritavancin. We will follow in the clinic  At the time of his discharge his pain was well controlled.  He was able to ambulate around his room without difficulty.  He was afebrile and had no new clinical complaints.  Active issues managed during this hospital stay include the following:  MSSA bacteremia Initial culture positive 6/4 - follow-up 6/6 clear -antibiotic course as detailed above   Tricuspid valve MSSA vegetation Noted on TEE - care as per above -no role for intervention presently -no clinical evidence of gross valve dysfunction   Septic arthritis/osteomyelitis of the lumbar spine - Left erector spinae muscle abscess/paraspinal abscess repeat MRI 7/3 noted decreased size of the left paraspinal abscess and unchanged appearance of edema within L4 and L5 - area under question first noted 6/18 - aspiration of this region accomplished 6/20 revealed few staph - repeat MRI 7/3 noted decreasing size of abscess - ID requested repeat drainage of this  area by IR which was accomplished 0/09/5850 without complication and with very minimal fluid obtained   Uncontrolled DM2 with hyperglycemia A1c 10.8 -does essentially nothing to manage or monitor his diabetes at home - CBG presently controlled -has been educated on use of insulin and CBG checks  during his hospital stay -TOC has made arrangements for outpatient follow-up with her primary care clinic -was provided CBG meter, lancets, testing strips, insulin pens, and needles per the [facility] with supplies delivered to the bedside prior to his discharge   Hypocalcemia Resolved   Normocytic anemia Due to above - hemoglobin stable at time of discharge   HTN Blood pressure controlled at time of discharge   Hyponatremia Likely hypovolemic in etiology -resolved   Hypomagnesemia Due to poor nutrition -corrected with supplementation   IV drug abuse Has been counseled on absolute need for ongoing abstention  Allergies as of 02/19/2022       Reactions   Trazodone And Nefazodone Other (See Comments)   Dry mouth        Medication List     STOP taking these medications    HYDROcodone-acetaminophen 5-325 MG tablet Commonly known as: NORCO/VICODIN   lidocaine 5 % Commonly known as: Lidoderm   metFORMIN 1000 MG tablet Commonly known as: GLUCOPHAGE   naloxone 0.4 MG/ML injection Commonly known as: NARCAN       TAKE these medications    acetaminophen 325 MG tablet Commonly known as: TYLENOL Take 2 tablets (650 mg total) by mouth every 6 (six) hours as needed for mild pain (or Fever >/= 101).   blood glucose meter kit and supplies Dispense based on patient and insurance preference. Use up to four times daily as directed. (FOR ICD-10 E10.9, E11.9).   cefadroxil 500 MG capsule Commonly known as: DURICEF Take 2 capsules by mouth 2 times daily for 21 days. Start taking on Friday 7/14 and continue for 3 weeks Start taking on: February 26, 2022   celecoxib 200 MG capsule Commonly known as: CELEBREX Take 1 capsule (200 mg total) by mouth 2 (two) times daily.   cyclobenzaprine 10 MG tablet Commonly known as: FLEXERIL Take 1 tablet (10 mg total) by mouth 3 (three) times daily. What changed:  when to take this reasons to take this   gabapentin 400 MG  capsule Commonly known as: NEURONTIN Take 1 capsule (400 mg total) by mouth 3 (three) times daily.   insulin aspart 100 UNIT/ML FlexPen Commonly known as: NOVOLOG Inject 7 Units into the skin 3 (three) times daily with meals.   Insulin Pen Needle 32G X 4 MM Misc Use 5 (five) times daily.   Levemir FlexTouch 100 UNIT/ML FlexPen Generic drug: insulin detemir Inject 14 units each day at 10:00AM, and inject 22 units each day at 10:00PM   oxyCODONE-acetaminophen 5-325 MG tablet Commonly known as: PERCOCET/ROXICET Take 1-2 tablets by mouth every 4 (four) hours as needed for severe pain.   polyethylene glycol 17 g packet Commonly known as: MIRALAX / GLYCOLAX Take 17 g by mouth 2 (two) times daily as needed for mild constipation.   senna-docusate 8.6-50 MG tablet Commonly known as: Senokot-S Take 1 tablet by mouth 2 (two) times daily.        Day of Discharge BP 136/83 (BP Location: Right Arm)   Pulse 82   Temp 97.6 F (36.4 C) (Oral)   Resp 19   Ht 6' (1.829 m)   Wt 82 kg   SpO2 99%   BMI 24.52 kg/m  Physical Exam: General: No acute respiratory distress Lungs: Clear to auscultation bilaterally without wheezes or crackles Cardiovascular: Regular rate and rhythm without murmur gallop or rub normal S1 and S2 Abdomen: Nontender, nondistended, soft, bowel sounds positive, no rebound, no ascites, no appreciable mass Extremities: No significant cyanosis, clubbing, or edema bilateral lower extremities  Basic Metabolic Panel: Recent Labs  Lab 02/15/22 0759 02/19/22 0537  NA 137 138  K 4.0 4.0  CL 102 102  CO2 27 30  GLUCOSE 124* 130*  BUN 27* 22*  CREATININE 0.79 0.83  CALCIUM 9.2 9.2  MG 1.9 1.8   CBC: Recent Labs  Lab 02/15/22 0759 02/19/22 0537  WBC 7.0 6.8  HGB 10.6* 10.1*  HCT 33.3* 31.5*  MCV 84.7 84.5  PLT 273 289    CBG: Recent Labs  Lab 02/18/22 1139 02/18/22 1635 02/18/22 2201 02/19/22 0716 02/19/22 1159  GLUCAP 76 147* 150* 138* 82     Recent Results (from the past 240 hour(s))  Aerobic/Anaerobic Culture w Gram Stain (surgical/deep wound)     Status: None (Preliminary result)   Collection Time: 02/19/22 11:19 AM   Specimen: Abscess  Result Value Ref Range Status   Specimen Description   Final    ABSCESS LT PARASPINAS Performed at [facility], [place] [address], [place], [place] [address]    Special Requests   Final    NONE Performed at [facility], [place] [address], [place], [place] [address]    Gram Stain   Final    NO WBC SEEN NO ORGANISMS SEEN Performed at [facility], [place] [address], [place], [place] [address]    Culture PENDING  Incomplete   Report Status PENDING  Incomplete     Time spent in discharge (includes decision making & examination of pt): 35 minutes  02/19/2022, 3:45 PM   Cherene Altes, MD [facility] Office  [phone]

## 2022-02-19 NOTE — Telephone Encounter (Signed)
Pharmacy Patient Advocate Encounter  Insurance verification completed.    The patient is uninsured  The patient is currently admitted and ran test claims for the following: linezolid 600 mg tablets.  Copays and coinsurance results were relayed to Inpatient clinical team.

## 2022-02-19 NOTE — Plan of Care (Signed)
Instructions were reviewed with patient. All questions were answered. Patient was transported to main entrance by wheelchair. ° °

## 2022-02-19 NOTE — TOC Transition Note (Signed)
Transition of Care [facility]) - CM/SW Discharge Note   Patient Details  Name: Tanner [name] MRN: [id number] Date of Birth: 07-11-1966  [facility] CM/SW Contact:  Amada Jupiter, LCSW Phone Number: 02/19/2022, 3:18 PM   Clinical Narrative:    Alerted today that pt has been medically cleared for dc and will need assistance with medications and PCP.  Have placed pt into the [facility] program and scripts sent to [facility] with request for meds to bed delivery.  Have secured a new patient appt at [facility] on 7/11 @ 2:00 with Dr. Shan Levans - all info on AVS.  Have provided patient with a bus pass for dc transportation.  All reviewed with pt/MD/RN.  No further TOC needs.   Final next level of care: Home/Self Care Barriers to Discharge: Barriers Resolved   Patient Goals and CMS Choice Patient states their goals for this hospitalization and ongoing recovery are:: Apply for disability   Choice offered to / list presented to : NA  Discharge Placement                       Discharge Plan and Services In-house Referral: Clinical Social Work   Post Acute Care Choice: NA          DME Arranged: N/A DME Agency: NA                  Social Determinants of Health (SDOH) Interventions     Readmission Risk Interventions    02/19/2022    3:15 PM 01/28/2022   10:36 AM  Readmission Risk Prevention Plan  Transportation Screening Complete   HRI or Home Care Consult  Complete  Social Work Consult for Recovery Care Planning/Counseling  Complete  Palliative Care Screening  Not Applicable  Medication Review [facility]) Complete   PCP or Specialist appointment within 3-5 days of discharge Complete   HRI or Home Care Consult Complete   SW Recovery Care/Counseling Consult Complete   Palliative Care Screening Not Applicable   Skilled Nursing Facility Not Applicable

## 2022-02-19 NOTE — Progress Notes (Addendum)
[facility] Infectious Diseases Follow Up Note  Patient Identification: Patient Name: Tanner [name] MRN: [id number] Admit Date: 01/17/2022  5:39 AM Age: 56 y.o.Today's Date: 02/19/2022  Reason for Visit: MSSA bacteremia /Lumbar vertebral infection   Principal Problem:   MSSA bacteremia Active Problems:   Tobacco abuse   Septic arthritis of lumbar spine (HCC)   Controlled type 2 diabetes mellitus without complication, without long-term current use of insulin (HCC)   Hypocalcemia   Normocytic anemia   Hyponatremia   Essential hypertension   Hypomagnesemia   IVDU (intravenous drug user)   Osteomyelitis of lumbar spine (HCC)   Chronic pain   Insomnia   Left erector spinae muscle abscess   Constipation   Infective endocarditis of tricuspid valve   Acute low back pain   Osteomyelitis (HCC)   Paraspinal abscess (HCC)   Delusion (HCC)   Antibiotics:  Cefazolin 6/4- Vancomycin 6/4 Cefepime 6/4  Lines/Hardwares:   Interval Events: Continues to remain afebrile, no leukocytosis plan for IR guided drainage today  Assessment # MSSA bacteremia complicated with possible pulmonary septic emboli and TV endocarditis -Blood cultures cleared on 6/6 -6/5 TTE highly mobile flattening on the tricuspid valve that is not well visualized.  Findings concerning for possible vegetation.  # L4-L5 septic arthritis and osteomyelitis with associated left dorsal paraspinous abscess - 6/20 s/p Ultrasound-guided aspiration of the left paraspinal abscess with majority of fluid aspirated at that time.  Cultures from this also grew MSSA. - Repeat MRI L spine 7/4, decreasing size of left dorsal paraspinous abscess ( 1.9*0.8*7.1cm). Neurosurgery recommends IR drainage.  Only 0.5 mL of fluid aspirated by IR, sent for cultures    # DM uncontrolled # substance use d/o -HIV negative in June 2023.  Not on a PICC line candidate for OPAT.  Counseled on  cessation # Hepatitis C -HCV RNA negative in June 2023  Recommendations Patient is eager to go home .  He has already received approximately a month of IV antibiotics. Plan for 1 dose of oritavancin followed by 3 more weeks of cefadroxil to be taken after a week of oritavancin.  We will follow in the clinic.  Otherwise ID will sign off for now Discussed with primary and ID pharmacy.  Rest of the management as per the primary team. Thank you for the consult. Please page with pertinent questions or concerns.  ______________________________________________________________________ Subjective patient seen and examined at the bedside.  No complaints. He is willing to have the drainage procedure with IR but does not want drain placed. Eager to go home Denies nausea, vomiting or diarrhea   Past Medical History:  Diagnosis Date   Cellulitis of right foot 05/17/2019   Diabetes mellitus without complication (HCC)    Drug abuse (HCC)    Essential hypertension 09/20/2017   Formatting of this note might be different from the original. Stable with current therapy; continue  Last Assessment & Plan:  Formatting of this note might be different from the original.   Hepatitis C antibody positive in blood 10/05/2017   Formatting of this note might be different from the original. 10/05/17: ab +, RNA neg on 2/12 labs. Likely past infection that is resolved. F/u prn.   History of vitamin D deficiency 09/20/2017   Formatting of this note might be different from the original. 09/20/17: per pt; labs 1 wk; supplement prn.   Opioid dependence, uncomplicated (HCC) 02/24/2018   Formatting of this note might be different from the original. Self-medicating with buprenorphine Formatting of this  note might be different from the original. Self-medicating with buprenorphine   Periodontal disease 10/11/2017   Formatting of this note might be different from the original. 10/11/17: refer to dental   Tobacco abuse    Type 2  diabetes mellitus with hyperglycemia (HCC) 01/17/2022   Past Surgical History:  Procedure Laterality Date   SKIN GRAFT       Vitals BP 132/75 (BP Location: Right Arm)   Pulse 74   Temp 97.6 F (36.4 C) (Oral)   Resp 17   Ht 6' (1.829 m)   Wt 82 kg   SpO2 98%   BMI 24.52 kg/m     Physical Exam Constitutional: Sitting up in the bed and appears comfortable    Comments:   Cardiovascular:     Rate and Rhythm: Normal rate and regular rhythm.     Heart sounds:   Pulmonary:     Effort: Pulmonary effort is normal on room air    Comments: Normal breath sounds  Abdominal:     Palpations: Abdomen is soft.     Tenderness: Nondistended and nontender  Musculoskeletal:        General: No swelling or tenderness.  No spinal tenderness  Skin:    Comments: No obvious lesions or rashes  Neurological:     General: Grossly non focal, awake, alert and oriented  Psychiatric:        Mood and Affect: Mood normal.   Pertinent Microbiology Results for orders placed or performed during the hospital encounter of 01/17/22  Blood culture (routine x 2)     Status: Abnormal   Collection Time: 01/17/22  1:40 PM   Specimen: BLOOD  Result Value Ref Range Status   Specimen Description   Final    BLOOD BLOOD LEFT FOREARM Performed at [facility], [address], [place], [place] [address]    Special Requests   Final    BOTTLES DRAWN AEROBIC AND ANAEROBIC Blood Culture adequate volume Performed at [facility], [address], [place], [place] [address]    Culture  Setup Time   Final    GRAM POSITIVE COCCI ANAEROBIC BOTTLE ONLY IN BOTH AEROBIC AND ANAEROBIC BOTTLES CRITICAL RESULT CALLED TO, READ BACK BY AND VERIFIED WITH: PHARMD MICHELLE LILLISTON 01/18/22@3 :11 BY TW Performed at [facility], [address] [place], [place] [address]    Culture STAPHYLOCOCCUS AUREUS (A)  Final   Report Status 01/20/2022 FINAL  Final   Organism ID, Bacteria  STAPHYLOCOCCUS AUREUS  Final      Susceptibility   Staphylococcus aureus - MIC*    CIPROFLOXACIN <=0.5 SENSITIVE Sensitive     ERYTHROMYCIN <=0.25 SENSITIVE Sensitive     GENTAMICIN <=0.5 SENSITIVE Sensitive     OXACILLIN <=0.25 SENSITIVE Sensitive     TETRACYCLINE <=1 SENSITIVE Sensitive     VANCOMYCIN 1 SENSITIVE Sensitive     TRIMETH/SULFA <=10 SENSITIVE Sensitive     CLINDAMYCIN <=0.25 SENSITIVE Sensitive     RIFAMPIN <=0.5 SENSITIVE Sensitive     Inducible Clindamycin NEGATIVE Sensitive     * STAPHYLOCOCCUS AUREUS  Blood Culture ID Panel (Reflexed)     Status: Abnormal   Collection Time: 01/17/22  1:40 PM  Result Value Ref Range Status   Enterococcus faecalis NOT DETECTED NOT DETECTED Final   Enterococcus Faecium NOT DETECTED NOT DETECTED Final   Listeria monocytogenes NOT DETECTED NOT DETECTED Final   Staphylococcus species DETECTED (A) NOT DETECTED Final    Comment: CRITICAL RESULT CALLED  TO, READ BACK BY AND VERIFIED WITH: PHARMD MICHELLE LILLISTON 01/18/22@3 :11 BY TW    Staphylococcus aureus (BCID) DETECTED (A) NOT DETECTED Final    Comment: CRITICAL RESULT CALLED TO, READ BACK BY AND VERIFIED WITH: PHARMD MICHELLE LILLISTON 01/18/22@3 :11 BY TW    Staphylococcus epidermidis NOT DETECTED NOT DETECTED Final   Staphylococcus lugdunensis NOT DETECTED NOT DETECTED Final   Streptococcus species NOT DETECTED NOT DETECTED Final   Streptococcus agalactiae NOT DETECTED NOT DETECTED Final   Streptococcus pneumoniae NOT DETECTED NOT DETECTED Final   Streptococcus pyogenes NOT DETECTED NOT DETECTED Final   A.calcoaceticus-baumannii NOT DETECTED NOT DETECTED Final   Bacteroides fragilis NOT DETECTED NOT DETECTED Final   Enterobacterales NOT DETECTED NOT DETECTED Final   Enterobacter cloacae complex NOT DETECTED NOT DETECTED Final   Escherichia coli NOT DETECTED NOT DETECTED Final   Klebsiella aerogenes NOT DETECTED NOT DETECTED Final   Klebsiella oxytoca NOT DETECTED NOT DETECTED Final    Klebsiella pneumoniae NOT DETECTED NOT DETECTED Final   Proteus species NOT DETECTED NOT DETECTED Final   Salmonella species NOT DETECTED NOT DETECTED Final   Serratia marcescens NOT DETECTED NOT DETECTED Final   Haemophilus influenzae NOT DETECTED NOT DETECTED Final   Neisseria meningitidis NOT DETECTED NOT DETECTED Final   Pseudomonas aeruginosa NOT DETECTED NOT DETECTED Final   Stenotrophomonas maltophilia NOT DETECTED NOT DETECTED Final   Candida albicans NOT DETECTED NOT DETECTED Final   Candida auris NOT DETECTED NOT DETECTED Final   Candida glabrata NOT DETECTED NOT DETECTED Final   Candida krusei NOT DETECTED NOT DETECTED Final   Candida parapsilosis NOT DETECTED NOT DETECTED Final   Candida tropicalis NOT DETECTED NOT DETECTED Final   Cryptococcus neoformans/gattii NOT DETECTED NOT DETECTED Final   Meth resistant mecA/C and MREJ NOT DETECTED NOT DETECTED Final    Comment: Performed at [facility], [address], [place], [place] [address]  Blood culture (routine x 2)     Status: Abnormal   Collection Time: 01/17/22  1:50 PM   Specimen: BLOOD  Result Value Ref Range Status   Specimen Description   Final    BLOOD BLOOD RIGHT HAND Performed at [facility] [address] [place], [place] [address]    Special Requests   Final    BOTTLES DRAWN AEROBIC ONLY Blood Culture adequate volume Performed at [facility] [address] [place], [place] [address]    Culture  Setup Time   Final    GRAM POSITIVE COCCI AEROBIC BOTTLE ONLY CRITICAL VALUE NOTED.  VALUE IS CONSISTENT WITH PREVIOUSLY REPORTED AND CALLED VALUE.    Culture (A)  Final    STAPHYLOCOCCUS AUREUS SUSCEPTIBILITIES PERFORMED ON PREVIOUS CULTURE WITHIN THE LAST 5 DAYS. Performed at [facility], [place] [address] [place], [place] [address]    Report Status 01/20/2022 FINAL  Final  Blood culture (routine x 2)     Status: Abnormal   Collection Time: 01/17/22   2:05 PM   Specimen: BLOOD  Result Value Ref Range Status   Specimen Description   Final    BLOOD RIGHT ANTECUBITAL Performed at [facility] [address], [place], [place] [address]    Special Requests   Final    BOTTLES DRAWN AEROBIC AND ANAEROBIC Blood Culture results may not be optimal due to an excessive volume of blood received in culture bottles Performed at [facility] [address], [place],  [address]    Culture  Setup Time   Final    GRAM POSITIVE COCCI AEROBIC BOTTLE ONLY CRITICAL VALUE NOTED.  VALUE IS CONSISTENT WITH PREVIOUSLY REPORTED AND CALLED VALUE. IN BOTH AEROBIC AND ANAEROBIC BOTTLES    Culture (A)  Final    STAPHYLOCOCCUS AUREUS SUSCEPTIBILITIES PERFORMED ON PREVIOUS CULTURE WITHIN THE LAST 5 DAYS. Performed at [facility], [place] [address] [place], [place] [address]    Report Status 01/20/2022 FINAL  Final  Culture, blood (Routine X 2) w Reflex to ID Panel     Status: None   Collection Time: 01/19/22  9:40 AM   Specimen: BLOOD  Result Value Ref Range Status   Specimen Description   Final    BLOOD RIGHT ANTECUBITAL Performed at [facility] [address], [place], [place] [address]    Special Requests   Final    BOTTLES DRAWN AEROBIC AND ANAEROBIC Blood Culture adequate volume Performed at [facility] [address], [place], [place] [address]    Culture   Final    NO GROWTH 5 DAYS Performed at [facility], [place] [address] [place], [place] [address]    Report Status 01/24/2022 FINAL  Final  Culture, blood (Routine X 2) w Reflex to ID Panel     Status: None   Collection Time: 01/19/22  9:46 AM   Specimen: BLOOD  Result Value Ref Range Status   Specimen Description   Final    BLOOD BLOOD LEFT FOREARM Performed at [facility] [address], [place], [place] [address]    Special Requests   Final    BOTTLES DRAWN AEROBIC AND  ANAEROBIC Blood Culture adequate volume Performed at [facility] [address], [place], [place] [address]    Culture   Final    NO GROWTH 5 DAYS Performed at [facility], [place] [address], [place], [place] [address]    Report Status 01/24/2022 FINAL  Final  Aerobic/Anaerobic Culture w Gram Stain (surgical/deep wound)     Status: None   Collection Time: 02/02/22  3:06 PM   Specimen: Abscess  Result Value Ref Range Status   Specimen Description   Final    ABSCESS Performed at [facility] [address], [place], [place] [address]    Special Requests   Final    NONE Performed at [facility], [place] [address], [place], [place] [address]    Gram Stain   Final    ABUNDANT WBC PRESENT, PREDOMINANTLY PMN FEW GRAM POSITIVE COCCI    Culture   Final    FEW STAPHYLOCOCCUS AUREUS NO ANAEROBES ISOLATED Performed at [facility], [place] [address], [place], [place] [address]    Report Status 02/07/2022 FINAL  Final   Organism ID, Bacteria STAPHYLOCOCCUS AUREUS  Final      Susceptibility   Staphylococcus aureus - MIC*    CIPROFLOXACIN <=0.5 SENSITIVE Sensitive     ERYTHROMYCIN <=0.25 SENSITIVE Sensitive     GENTAMICIN <=0.5 SENSITIVE Sensitive     OXACILLIN <=0.25 SENSITIVE Sensitive     TETRACYCLINE <=1 SENSITIVE Sensitive     VANCOMYCIN 1 SENSITIVE Sensitive     TRIMETH/SULFA <=10 SENSITIVE Sensitive     CLINDAMYCIN <=0.25 SENSITIVE Sensitive     RIFAMPIN <=0.5 SENSITIVE Sensitive     Inducible Clindamycin NEGATIVE Sensitive     * FEW STAPHYLOCOCCUS AUREUS    Pertinent Lab.    Latest Ref Rng & Units 02/19/2022  5:37 AM 02/15/2022    7:59 AM 02/11/2022    4:23 AM  CBC  WBC 4.0 - 10.5 K/uL 6.8  7.0  4.9   Hemoglobin 13.0 - 17.0 g/dL 10.1  10.6  10.0   Hematocrit 39.0 - 52.0 % 31.5  33.3  30.8   Platelets 150 - 400 K/uL 289  273  258       Latest Ref Rng & Units 02/19/2022    5:37 AM 02/15/2022    7:59 AM  02/11/2022    4:23 AM  CMP  Glucose 70 - 99 mg/dL 130  124  125   BUN 6 - 20 mg/dL 22  27  26    Creatinine 0.61 - 1.24 mg/dL 0.83  0.79  0.91   Sodium 135 - 145 mmol/L 138  137  139   Potassium 3.5 - 5.1 mmol/L 4.0  4.0  4.3   Chloride 98 - 111 mmol/L 102  102  104   CO2 22 - 32 mmol/L 30  27  29    Calcium 8.9 - 10.3 mg/dL 9.2  9.2  9.3   Total Protein 6.5 - 8.1 g/dL 7.6   7.8   Total Bilirubin 0.3 - 1.2 mg/dL 0.3   0.2   Alkaline Phos 38 - 126 U/L 91   94   AST 15 - 41 U/L 17   14   ALT 0 - 44 U/L 10   9      Pertinent Imaging today Plain films and CT images have been personally visualized and interpreted; radiology reports have been reviewed. Decision making incorporated into the Impression / Recommendations.  MR Lumbar Spine W Wo Contrast  Result Date: 02/16/2022 CLINICAL DATA:  Lumbar osteomyelitis EXAM: MRI LUMBAR SPINE WITHOUT AND WITH CONTRAST TECHNIQUE: Multiplanar and multiecho pulse sequences of the lumbar spine were obtained without and with intravenous contrast. CONTRAST:  52mL GADAVIST GADOBUTROL 1 MMOL/ML IV SOLN COMPARISON:  01/31/2022 FINDINGS: Segmentation:  Standard. Alignment:  Physiologic. Vertebrae: There is persistent edema within the left posterior elements of L4 and L5. No new finding of osteomyelitis. Conus medullaris and cauda equina: Conus extends to the L1 level. Conus and cauda equina appear normal. Paraspinal and other soft tissues: The left dorsal paraspinous abscess has decreased in size. Disc levels: Small right subarticular disc protrusion at L5-S1 is unchanged. There is no spinal canal or neural foraminal stenosis. The other disc levels are unremarkable. IMPRESSION: 1. Decreased size of left dorsal paraspinous abscess. 2. Unchanged appearance of edema within the left posterior elements of L4 and L5. No new site of osteomyelitis. Electronically Signed   By: Ulyses Jarred M.D.   On: 02/16/2022 00:38   DG Abd Portable 1V  Result Date: 02/10/2022 CLINICAL DATA:   Constipation EXAM: PORTABLE ABDOMEN - 1 VIEW COMPARISON:  None Available. FINDINGS: No evidence of bowel obstruction. There is a moderate to large colonic stool burden, most concentrated in the ascending and transverse colon. No acute osseous abnormality. There are few punctate metallic debris overlying the abdomen, unclear if internal or external, not seen on prior CT in January 2021. IMPRESSION: Moderate to large colonic stool burden most concentrated in the ascending and transverse colon. No evidence of bowel obstruction. Electronically Signed   By: Maurine Simmering M.D.   On: 02/10/2022 09:48   CT CHEST W CONTRAST  Result Date: 02/04/2022 CLINICAL DATA:  Rib pain. EXAM: CT CHEST WITH CONTRAST TECHNIQUE: Multidetector CT imaging of the chest was performed during intravenous contrast administration. RADIATION  DOSE REDUCTION: This exam was performed according to the departmental dose-optimization program which includes automated exposure control, adjustment of the mA and/or kV according to patient size and/or use of iterative reconstruction technique. CONTRAST:  83mL OMNIPAQUE IOHEXOL 300 MG/ML  SOLN COMPARISON:  None Available. FINDINGS: Cardiovascular: No significant vascular findings. Normal heart size. No pericardial effusion. Mediastinum/Nodes: No enlarged mediastinal, hilar, or axillary lymph nodes. Thyroid gland, trachea, and esophagus demonstrate no significant findings. Lungs/Pleura: No pneumothorax is noted. Minimal left pleural effusion is noted with adjacent left lower lobe pneumonia or atelectasis. Focal air space opacity is noted medially in the right upper lobe concerning for focal infiltrate or scarring. Similar opacities are noted posteriorly in the right lower lobe and lingular segment of left upper lobe. Upper Abdomen: No acute abnormality. Musculoskeletal: No chest wall abnormality. No acute or significant osseous findings. IMPRESSION: Minimal left pleural effusion is noted with adjacent left  lower lobe pneumonia or atelectasis. Smaller focal airspace opacities are noted in the right upper, right lower and left upper lobes concerning for possible multifocal pneumonia. Electronically Signed   By: Marijo Conception M.D.   On: 02/04/2022 13:39   DG Chest 2 View  Result Date: 02/03/2022 CLINICAL DATA:  Pleuritic chest pain.  Smoker.  Hypertension. EXAM: CHEST - 2 VIEW COMPARISON:  04/25/2012 FINDINGS: The heart size and mediastinal contours are within normal limits. Blunting of the left costophrenic angle is identified, new from previous exam. Scar like density noted within the left lower lobe along the left heart border. No airspace consolidation. The visualized skeletal structures are unremarkable. IMPRESSION: Blunting of the left costophrenic angle may represent a small pleural effusion, pleuroparenchymal scarring or atelectasis. Electronically Signed   By: Kerby Moors M.D.   On: 02/03/2022 14:40   [place] FNA SOFT TISSUE  Result Date: 02/02/2022 INDICATION: 56 year old with osteomyelitis and abscess involving the lumbar paraspinal musculature. EXAM: ULTRASOUND-GUIDED ASPIRATION OF PARASPINAL ABSCESS MEDICATIONS: Moderate sedation ANESTHESIA/SEDATION: Moderate (conscious) sedation was employed during this procedure. A total of Versed 3.0mg  and fentanyl 200 mcg was administered intravenously at the order of the provider performing the procedure. Total intra-service moderate sedation time: 12 minutes. Patient's level of consciousness and vital signs were monitored continuously by radiology nurse throughout the procedure under the supervision of the provider performing the procedure. COMPLICATIONS: None immediate. PROCEDURE: Informed written consent was obtained from the patient after a thorough discussion of the procedural risks, benefits and alternatives. All questions were addressed. A timeout was performed prior to the initiation of the procedure. The lower back was evaluated ultrasound with the  patient in a prone position. Complex collection in the posterior lower lumbar left paraspinal tissue was identified. This finding corresponded with the area of concern on recent MRI. Skin was prepped with chlorhexidine and sterile field was created. A Yueh catheter was directed into this complex collection with real-time ultrasound guidance. Approximately 6 ml of brownish yellow purulent fluid was aspirated. Majority of the fluid component was aspirated. Bandage placed over the puncture site. FINDINGS: Complex collection in the left lower paraspinal tissue. This area roughly measures 9.5 x 1.8 x 3.2 cm. Small fluid components within this irregular collection. Majority of the fluid was aspirated. 6 mL of purulent fluid was removed. IMPRESSION: Ultrasound-guided aspiration of the left paraspinal abscess. Electronically Signed   By: Markus Daft M.D.   On: 02/02/2022 16:08   MR PELVIS WO CONTRAST  Result Date: 01/31/2022 CLINICAL DATA:  Osteomyelitis suspected, pelvis, xray done muskuloskeletal pelvis to look for  deep infection. Left L4-5 facet septic arthritis and osteomyelitis EXAM: MRI PELVIS WITHOUT CONTRAST TECHNIQUE: Multiplanar multisequence MR imaging of the pelvis was performed. No intravenous contrast was administered. COMPARISON:  CT 08/21/2019.  MRI lumbar spine 01/31/2022 FINDINGS: Urinary Tract:  No abnormality visualized. Bowel:  Unremarkable visualized pelvic bowel loops. Vascular/Lymphatic: No pathologically enlarged lymph nodes. No significant vascular abnormality seen although examination is not tailored for evaluation of the vascular structures. Reproductive:  No mass or other significant abnormality Other: Trace presacral free fluid, nonspecific. No organized intrapelvic fluid collection. Musculoskeletal: Septic arthritis of the left L4-5 facet joint with associated osteomyelitis, better seen on dedicated contrast enhance lumbar spine MRI performed same day. Extensive myositis within the  posterior left paraspinal musculature adjacent to the left L4-5 facet joint and extending inferiorly to the level of the sacrum. Intramuscular fluid collection measures approximately 9.4 x 2.3 x 3.7 cm (series 12, image 5; series 10, image 14). No acute fracture. No dislocation. No femoral head avascular necrosis. No significant arthropathy of the bilateral hips. No hip joint effusion. Bony pelvis intact without diastasis. SI joints and pubic symphysis within normal limits. No SI joint effusion. No marrow replacing bone lesion. Tendinosis of the left hamstring tendon origin with reactive marrow edema in the left ischial tuberosity. Mild intramuscular edema within the bilateral gluteus medius and minimus muscles anteriorly (series 12, image 17), likely related to muscle strain. IMPRESSION: 1. Septic arthritis of the left L4-5 facet joint with associated osteomyelitis, better evaluated on dedicated contrast-enhanced lumbar spine MRI performed same day. 2. Extensive myositis within the posterior left paraspinal musculature adjacent to the left L4-5 facet joint and extending inferiorly to the level of the sacrum. Associated intramuscular abscess measures approximately 9.4 x 2.3 x 3.7 cm. 3. Elsewhere, no additional findings of active infection within the pelvis. No additional sites of osteomyelitis or septic arthritis. 4. Mild intramuscular edema within the bilateral gluteus medius and minimus muscles anteriorly, likely related to muscle strain. 5. Tendinosis of the left hamstring tendon origin with reactive marrow edema in the left ischial tuberosity. Electronically Signed   By: Davina Poke D.O.   On: 01/31/2022 13:55   MR Lumbar Spine W Wo Contrast  Result Date: 01/31/2022 CLINICAL DATA:  56 year old male with septic arthritis left L4-L5 facet, osteomyelitis. Subsequent encounter. EXAM: MRI LUMBAR SPINE WITHOUT AND WITH CONTRAST TECHNIQUE: Multiplanar and multiecho pulse sequences of the lumbar spine were  obtained without and with intravenous contrast. CONTRAST:  25mL GADAVIST GADOBUTROL 1 MMOL/ML IV SOLN COMPARISON:  Lumbar MRI 01/17/2022. FINDINGS: Segmentation:  Normal, as numbered earlier this month. Alignment:  Stable lumbar lordosis. Vertebrae: Progressed marrow edema in the abnormal left L4-L5 facets and pedicles which are enhancing, compatible with on going osteomyelitis. Abnormal facet joint fluid persists. And serpiginous posterior paraspinal intramuscular abscess persists and now appears more unilocular although still serpiginous and irregular. See series 15 images 14 through 17. The abscess extends from the lower L3 level through the lower S1 level. Fluid volume estimated at 15 mL. Confluent regional muscle edema. L4 and L5 vertebral bodies, contralateral posterior elements, spinous processes, and the other lumbar levels remain spared. Visible sacrum and SI joints remain within normal limits. Conus medullaris and cauda equina: Conus extends to the L1 level. No lower spinal cord or conus signal abnormality. No abnormal intradural enhancement or dural thickening. Normal cauda equina nerve roots. Paraspinal and other soft tissues: Contralateral right paraspinal soft tissues remain normal. Negative visible abdominal viscera. Disc levels: Stable lower thoracic and  lumbar intervertebral discs with no inflammation. Confluent inflammation in the left lateral epidural space of L4-L5, a involving the exiting left L4 and L5 nerves. The epidural inflammation extends to the left lateral recess of L5-S1 involving the descending left S1 nerve also. However, there is no abscess in the epidural space. No lumbar spinal stenosis. IMPRESSION: 1. Progressed left L4 and L5 facet and pedicle Osteomyelitis from earlier this month, with underlying septic facet joint. 2. Confluent left lateral epidural inflammation now from the left L4 to the left S1 nerve levels. No epidural abscess. 3. But ongoing overlying Left Erector Spinae  Intramuscular Abscess centered at L4-L5, estimated abscess volume 15 mm and tremendous regional paraspinal muscle and soft tissue inflammation. Electronically Signed   By: Genevie Ann M.D.   On: 01/31/2022 09:36     I spent approx 55  minutes for this patient encounter including review of prior medical records, coordination of care with primary/other specialist with greater than 50% of time being face to face/counseling and discussing diagnostics/treatment plan with the patient/family.  Electronically signed by:   Rosiland Oz, MD Infectious Disease Physician [facility] Pager: [phone]

## 2022-02-23 ENCOUNTER — Ambulatory Visit: Payer: Self-pay | Admitting: Critical Care Medicine

## 2022-02-23 NOTE — Progress Notes (Deleted)
Developing right is he also had a tricuspid valve vegetation and he has follow-up actually they sent him out on some form he got a month that he stayed in the hospital a month ago with a walking boot he was sent given my note to give him IV antibiotics antrostomy with so you will see that discharge immediately go to you  New Patient Office Visit  Subjective    Patient ID: Tanner [name], male    DOB: 1966/04/19  Age: 56 y.o. MRN: [id number]  CC: No chief complaint on file.   HPI Tanner [name] presents to establish care Post HFU and new PCP Pt to est;  DOB:August 30, 1965  Date of Admission: 01/17/2022 Date of Discharge: 02/19/2022   Attending Physician:Tanner Hennie Duos, MD   Patient's GPQ:DIYMEBR, No Pcp Per   Consults: ID Neurosurgery IR   Disposition: D/C home    Follow-up Appts:   Follow-up Information       Social Security Follow up.   Why: Call [phone] to find out what you will need in order to apply for disability. You will also need to ask what documentation is needed to request another social security card.            [place]. Call.   Why: Call [phone] to find out what you will need in order to apply for Medicaid.            Rosiland Oz, MD Follow up.   Specialty: Infectious Diseases Why: The clinic will call you to arrange a follow up visit. If you do not hear from them in the next 5-7 days, call the clinic to arrange the appointment. Contact information: [address] [place] [address] [phone]                          Tests Needing Follow-up: -Assess patient for continued abstinence from IV drug abuse -Check CBG log and adjust insulin as necessary -Assure patient compliant with follow-up in ID clinic   Discharge Diagnoses: MSSA bacteremia Tricuspid valve MSSA vegetation/endocarditis  Septic arthritis/osteomyelitis of the lumbar spine - Left erector spinae muscle abscess/paraspinal  abscess Uncontrolled DM2 with hyperglycemia Hypocalcemia Normocytic anemia HTN Hyponatremia Hypomagnesemia IV drug abuse   Initial presentation: 56 year old with a history of DM2, HTN, IV crystal meth abuse, tobacco abuse, and hepatitis C who presented to the hospital 01/17/2022 with back pain and was found to have MSSA bacteremia with tricuspid valve endocarditis, septic arthritis/osteomyelitis of L4-L5, epidural inflammation at L4-S1, left erector spinae muscle abscess, and septic emboli to bilateral lungs.     Hospital Course: He underwent ultrasound-guided drainage of the left erector spinae abscess 02/02/2022, and again on 02/19/22.  The initial drainage produced a modest amount of fluid, and the second drainage almost none.  His hospital course has been prolonged to allow for observed IV antibiotics as it was not safe to discharge him home with an indwelling IV given his illicit IV drug abuse.  Following his repeat aspiration procedure 02/19/2022 he was cleared for discharge home with ID.  ID treatment course is as follows:   He has already received approximately a month of IV antibiotics. Plan for 1 dose of oritavancin followed by 3 more weeks of cefadroxil to be taken after a week of oritavancin. We will follow in the clinic   At the time of his discharge his pain was well controlled.  He was able to ambulate around his  room without difficulty.  He was afebrile and had no new clinical complaints.   Active issues managed during this hospital stay include the following:   MSSA bacteremia Initial culture positive 6/4 - follow-up 6/6 clear -antibiotic course as detailed above   Tricuspid valve MSSA vegetation Noted on TEE - care as per above -no role for intervention presently -no clinical evidence of gross valve dysfunction   Septic arthritis/osteomyelitis of the lumbar spine - Left erector spinae muscle abscess/paraspinal abscess repeat MRI 7/3 noted decreased size of the left paraspinal  abscess and unchanged appearance of edema within L4 and L5 - area under question first noted 6/18 - aspiration of this region accomplished 6/20 revealed few staph - repeat MRI 7/3 noted decreasing size of abscess - ID requested repeat drainage of this area by IR which was accomplished 04/17/1193 without complication and with very minimal fluid obtained   Uncontrolled DM2 with hyperglycemia A1c 10.8 -does essentially nothing to manage or monitor his diabetes at home - CBG presently controlled -has been educated on use of insulin and CBG checks during his hospital stay -TOC has made arrangements for outpatient follow-up with her primary care clinic -was provided CBG meter, lancets, testing strips, insulin pens, and needles per the [facility] pharmacy with supplies delivered to the bedside prior to his discharge   Hypocalcemia Resolved   Normocytic anemia Due to above - hemoglobin stable at time of discharge   HTN Blood pressure controlled at time of discharge   Hyponatremia Likely hypovolemic in etiology -resolved   Hypomagnesemia Due to poor nutrition -corrected with supplementation   IV drug abuse Has been counseled on absolute need for ongoing abstention     Outpatient Encounter Medications as of 02/23/2022  Medication Sig   acetaminophen (TYLENOL) 325 MG tablet Take 2 tablets (650 mg total) by mouth every 6 (six) hours as needed for mild pain (or Fever >/= 101).   blood glucose meter kit and supplies Dispense based on patient and insurance preference. Use up to four times daily as directed. (FOR ICD-10 E10.9, E11.9).   [START ON 02/26/2022] cefadroxil (DURICEF) 500 MG capsule Take 2 capsules by mouth 2 times daily for 21 days. Start taking on Friday 7/14 and continue for 3 weeks   celecoxib (CELEBREX) 200 MG capsule Take 1 capsule (200 mg total) by mouth 2 (two) times daily.   cyclobenzaprine (FLEXERIL) 10 MG tablet Take 1 tablet (10 mg total) by mouth 3 (three) times daily.   gabapentin  (NEURONTIN) 400 MG capsule Take 1 capsule (400 mg total) by mouth 3 (three) times daily.   insulin aspart (NOVOLOG) 100 UNIT/ML FlexPen Inject 7 Units into the skin 3 (three) times daily with meals.   insulin detemir (LEVEMIR FLEXTOUCH) 100 UNIT/ML FlexPen Inject 14 units each day at 10:00AM, and inject 22 units each day at 10:00PM   Insulin Pen Needle 32G X 4 MM MISC Use 5 (five) times daily.   oxyCODONE-acetaminophen (PERCOCET/ROXICET) 5-325 MG tablet Take 1-2 tablets by mouth every 4 (four) hours as needed for severe pain.   polyethylene glycol (MIRALAX / GLYCOLAX) 17 g packet Take 17 g by mouth 2 (two) times daily as needed for mild constipation.   senna-docusate (SENOKOT-S) 8.6-50 MG tablet Take 1 tablet by mouth 2 (two) times daily.   No facility-administered encounter medications on file as of 02/23/2022.    Past Medical History:  Diagnosis Date   Cellulitis of right foot 05/17/2019   Diabetes mellitus without complication ([place])  Drug abuse ([place])    Essential hypertension 09/20/2017   Formatting of this note might be different from the original. Stable with current therapy; continue  Last Assessment & Plan:  Formatting of this note might be different from the original.   Hepatitis C antibody positive in blood 10/05/2017   Formatting of this note might be different from the original. 10/05/17: ab +, RNA neg on 2/12 labs. Likely past infection that is resolved. F/u prn.   History of vitamin D deficiency 09/20/2017   Formatting of this note might be different from the original. 09/20/17: per pt; labs 1 wk; supplement prn.   Opioid dependence, uncomplicated ([place]) 09/29/863   Formatting of this note might be different from the original. Self-medicating with buprenorphine Formatting of this note might be different from the original. Self-medicating with buprenorphine   Periodontal disease 10/11/2017   Formatting of this note might be different from the original. 10/11/17: refer to dental   Tobacco  abuse    Type 2 diabetes mellitus with hyperglycemia ([place]) 01/17/2022    Past Surgical History:  Procedure Laterality Date   SKIN GRAFT      Family History  Problem Relation Age of Onset   Dementia Father    Diabetes Mellitus II Brother     Social History   Socioeconomic History   Marital status: Legally Separated    Spouse name: Not on file   Number of children: Not on file   Years of education: Not on file   Highest education level: Not on file  Occupational History   Not on file  Tobacco Use   Smoking status: Every Day    Types: Cigarettes   Smokeless tobacco: Never  Vaping Use   Vaping Use: Some days  Substance and Sexual Activity   Alcohol use: Never   Drug use: Yes    Types: IV, Methamphetamines    Comment: heroin   Sexual activity: Not on file  Other Topics Concern   Not on file  Social History Narrative   Not on file   Social Determinants of Health   Financial Resource Strain: Not on file  Food Insecurity: Not on file  Transportation Needs: Not on file  Physical Activity: Not on file  Stress: Not on file  Social Connections: Not on file  Intimate Partner Violence: Not on file    ROS      Objective    There were no vitals taken for this visit.  Physical Exam  {Labs (Optional):23779}    Assessment & Plan:   Problem List Items Addressed This Visit   None   No follow-ups on file.   Asencion Noble, MD

## 2022-02-24 LAB — AEROBIC/ANAEROBIC CULTURE W GRAM STAIN (SURGICAL/DEEP WOUND)
Culture: NORMAL
Gram Stain: NONE SEEN

## 2022-03-04 ENCOUNTER — Telehealth (HOSPITAL_COMMUNITY): Payer: Self-pay

## 2022-03-04 NOTE — Telephone Encounter (Signed)
Transitions of Care Pharmacy   Call attempted for a pharmacy transitions of care follow-up. Each number provided was not in service.   Call attempt #1.

## 2022-03-10 ENCOUNTER — Inpatient Hospital Stay: Payer: Self-pay | Admitting: Infectious Diseases

## 2022-07-02 DIAGNOSIS — F22 Delusional disorders: Secondary | ICD-10-CM | POA: Insufficient documentation

## 2022-07-02 DIAGNOSIS — Z794 Long term (current) use of insulin: Secondary | ICD-10-CM | POA: Insufficient documentation

## 2022-07-02 DIAGNOSIS — Z4802 Encounter for removal of sutures: Secondary | ICD-10-CM | POA: Insufficient documentation
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
## 2022-07-03 ENCOUNTER — Encounter (HOSPITAL_BASED_OUTPATIENT_CLINIC_OR_DEPARTMENT_OTHER): Payer: Self-pay | Admitting: Emergency Medicine

## 2022-07-03 ENCOUNTER — Emergency Department (HOSPITAL_BASED_OUTPATIENT_CLINIC_OR_DEPARTMENT_OTHER)
Admission: EM | Admit: 2022-07-03 | Discharge: 2022-07-03 | Disposition: A | Discharge status: Home / Self Care | Payer: Self-pay | Attending: Emergency Medicine | Admitting: Emergency Medicine

## 2022-07-03 DIAGNOSIS — Z4802 Encounter for removal of sutures: Secondary | ICD-10-CM

## 2022-07-03 DIAGNOSIS — F22 Delusional disorders: Secondary | ICD-10-CM

## 2022-07-03 NOTE — ED Notes (Signed)
Pt standing outside security office  Pt pacing   States he feels like he is going to pass out  Instructed pt to go sit back down  Pt refused  Pt asking for water, water given  Pt requesting his vital signs to be checked
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
## 2022-09-17 ENCOUNTER — Ambulatory Visit (HOSPITAL_COMMUNITY)
Admission: EM | Admit: 2022-09-17 | Discharge: 2022-09-19 | Disposition: A | Discharge status: Home / Self Care | Payer: No Payment, Other | Attending: Behavioral Health | Admitting: Behavioral Health

## 2022-09-17 DIAGNOSIS — F19959 Other psychoactive substance use, unspecified with psychoactive substance-induced psychotic disorder, unspecified: Secondary | ICD-10-CM

## 2022-09-17 DIAGNOSIS — Z1152 Encounter for screening for COVID-19: Secondary | ICD-10-CM | POA: Insufficient documentation

## 2022-09-17 DIAGNOSIS — F15159 Other stimulant abuse with stimulant-induced psychotic disorder, unspecified: Secondary | ICD-10-CM | POA: Insufficient documentation

## 2022-09-17 LAB — CBC WITH DIFFERENTIAL/PLATELET
Abs Immature Granulocytes: 0.02 10*3/uL (ref 0.00–0.07)
Basophils Absolute: 0.1 10*3/uL (ref 0.0–0.1)
Basophils Relative: 1 %
Eosinophils Absolute: 0.1 10*3/uL (ref 0.0–0.5)
Eosinophils Relative: 1 %
HCT: 35.2 % — ABNORMAL LOW (ref 39.0–52.0)
Hemoglobin: 12.3 g/dL — ABNORMAL LOW (ref 13.0–17.0)
Immature Granulocytes: 0 %
Lymphocytes Relative: 18 %
Lymphs Abs: 1.5 10*3/uL (ref 0.7–4.0)
MCH: 28.6 pg (ref 26.0–34.0)
MCHC: 34.9 g/dL (ref 30.0–36.0)
MCV: 81.9 fL (ref 80.0–100.0)
Monocytes Absolute: 0.5 10*3/uL (ref 0.1–1.0)
Monocytes Relative: 6 %
Neutro Abs: 5.9 10*3/uL (ref 1.7–7.7)
Neutrophils Relative %: 74 %
Platelets: 370 10*3/uL (ref 150–400)
RBC: 4.3 MIL/uL (ref 4.22–5.81)
RDW: 13.1 % (ref 11.5–15.5)
WBC: 8 10*3/uL (ref 4.0–10.5)
nRBC: 0 % (ref 0.0–0.2)

## 2022-09-17 LAB — HEMOGLOBIN A1C
Hgb A1c MFr Bld: 9.2 % — ABNORMAL HIGH (ref 4.8–5.6)
Mean Plasma Glucose: 217.34 mg/dL

## 2022-09-17 LAB — POC SARS CORONAVIRUS 2 AG: SARSCOV2ONAVIRUS 2 AG: NEGATIVE

## 2022-09-17 LAB — LIPID PANEL
Cholesterol: 153 mg/dL (ref 0–200)
HDL: 71 mg/dL (ref >40)
LDL Cholesterol: 70 mg/dL (ref 0–99)
Total CHOL/HDL Ratio: 2.2 RATIO
Triglycerides: 58 mg/dL (ref <150)
VLDL: 12 mg/dL (ref 0–40)

## 2022-09-17 LAB — COMPREHENSIVE METABOLIC PANEL
ALT: 41 U/L (ref 0–44)
AST: 35 U/L (ref 15–41)
Albumin: 3.8 g/dL (ref 3.5–5.0)
Alkaline Phosphatase: 111 U/L (ref 38–126)
Anion gap: 11 (ref 5–15)
BUN: 15 mg/dL (ref 6–20)
CO2: 24 mmol/L (ref 22–32)
Calcium: 9.3 mg/dL (ref 8.9–10.3)
Chloride: 99 mmol/L (ref 98–111)
Creatinine, Ser: 0.84 mg/dL (ref 0.61–1.24)
GFR, Estimated: >60 mL/min (ref >60)
Glucose, Bld: 218 mg/dL — ABNORMAL HIGH (ref 70–99)
Potassium: 3.6 mmol/L (ref 3.5–5.1)
Sodium: 134 mmol/L — ABNORMAL LOW (ref 135–145)
Total Bilirubin: 0.5 mg/dL (ref 0.3–1.2)
Total Protein: 7 g/dL (ref 6.5–8.1)

## 2022-09-17 LAB — GLUCOSE, CAPILLARY
Glucose-Capillary: 210 mg/dL — ABNORMAL HIGH (ref 70–99)
Glucose-Capillary: 185 mg/dL — ABNORMAL HIGH (ref 70–99)

## 2022-09-17 LAB — ETHANOL: Alcohol, Ethyl (B): <10 mg/dL (ref <10)

## 2022-09-17 LAB — TSH: TSH: 1.301 u[IU]/mL (ref 0.350–4.500)

## 2022-09-17 MED ORDER — METFORMIN HCL 500 MG PO TABS
500.0000 mg | ORAL_TABLET | Freq: Two times a day (BID) | ORAL | Status: DC
  Administered 2022-09-18 – 2022-09-19 (×3): 500 mg via ORAL
  Filled 2022-09-17 (×3): qty 1

## 2022-09-17 MED ORDER — HYDROXYZINE HCL 25 MG PO TABS
25.0000 mg | ORAL_TABLET | Freq: Three times a day (TID) | ORAL | Status: DC | PRN
  Administered 2022-09-17 – 2022-09-18 (×2): 25 mg via ORAL
  Filled 2022-09-17 (×2): qty 1

## 2022-09-17 MED ORDER — ALUM & MAG HYDROXIDE-SIMETH 200-200-20 MG/5ML PO SUSP: 30.0000 mL | ORAL | Status: DC | PRN

## 2022-09-17 MED ORDER — INSULIN DETEMIR 100 UNIT/ML ~~LOC~~ SOLN
14.0000 [IU] | Freq: Every day | SUBCUTANEOUS | Status: DC
  Administered 2022-09-17 – 2022-09-18 (×2): 14 [IU] via SUBCUTANEOUS

## 2022-09-17 MED ORDER — HALOPERIDOL 5 MG PO TABS
5.0000 mg | ORAL_TABLET | Freq: Two times a day (BID) | ORAL | Status: DC
  Administered 2022-09-17 – 2022-09-19 (×4): 5 mg via ORAL
  Filled 2022-09-17 (×4): qty 1

## 2022-09-17 MED ORDER — ACETAMINOPHEN 325 MG PO TABS: 650.0000 mg | ORAL_TABLET | Freq: Four times a day (QID) | ORAL | Status: DC | PRN

## 2022-09-17 MED ORDER — BENZTROPINE MESYLATE 0.5 MG PO TABS
0.5000 mg | ORAL_TABLET | Freq: Two times a day (BID) | ORAL | Status: DC
  Administered 2022-09-17 – 2022-09-19 (×4): 0.5 mg via ORAL
  Filled 2022-09-17 (×4): qty 1

## 2022-09-17 MED ORDER — NICOTINE 21 MG/24HR TD PT24
21.0000 mg | MEDICATED_PATCH | Freq: Every day | TRANSDERMAL | Status: DC
  Administered 2022-09-18: 21 mg via TRANSDERMAL
  Filled 2022-09-17 (×3): qty 1

## 2022-09-17 MED ORDER — BACITRACIN ZINC 500 UNIT/GM EX OINT: 1.0000 | TOPICAL_OINTMENT | Freq: Three times a day (TID) | CUTANEOUS | Status: DC | PRN

## 2022-09-17 MED ORDER — MAGNESIUM HYDROXIDE 400 MG/5ML PO SUSP: 30.0000 mL | Freq: Every day | ORAL | Status: DC | PRN

## 2022-09-17 NOTE — BH Assessment (Signed)
Comprehensive Clinical Assessment (CCA) Note  09/17/2022 [name] [id number] DISPOSITION: White NP recommends patient be monitored in Continuous Assessment.   [place] ED from 09/17/2022 in [facility] ED from 07/03/2022 in [facility] Emergency Department at [facility] ED to Hosp-Admission (Discharged) from 01/17/2022 in [facility] 3 [place] [facility]  C-SSRS RISK CATEGORY No Risk No Risk No Risk     The patient demonstrates the following risk factors for suicide: Chronic risk factors for suicide include: N/A. Acute risk factors for suicide include: N/A. Protective factors for this patient include: coping skills. Considering these factors, the overall suicide risk at this point appears to be low. Patient is appropriate for outpatient follow up.    Patient is a 57 year old male that presents this date voluntary to [facility] by [facility] after he contacted law enforcement [place]" after he reported "over 100 snipers are trying to kill him." Patient denies any S/I or H/I. Patient does report ongoing AVH although is vague in reference to content stating he "sees and hears everything." Patient denies any previous mental health history or receives any OP services. Patient denies any current mental health symptoms and feels his AVH is associated with his, "sixth sense." Patient reports using up to one gram of, "Ice" daily for the last two years. Patient's reports his method of ingestion is by IV. Patient states his last use was earlier this date when he used about one half a gram. Patient denies the use of any other substances. Patient is observed to be anxious, guarded and is a poor historian. Patient states he resides alone in his own home although has "a few friends who stay with him." Patient denies access to firearms or current legal issues. 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 Per chart review  mental health history is limited although patient was seen in 2021 for an accidental overdose.   Patient when asked in reference to orientation states, "we are all here." Patient speaks in a pressured voice and is difficult to obtain any history from due to current AMS. Patient's memory appears to be impaired with thoughts disorganized. Patient's mood is anxious/guarded with affect congruent. Patient states he currently is experiencing AVH although is vague in reference to content.   Chief Complaint: No chief complaint on file.  Visit Diagnosis: Substance Induced Psychosis     CCA Screening, Triage and Referral (STR)  Patient Reported Information How did you hear about [place]? Self  What Is the Reason for Your Visit/Call Today? Patient presents with AMS.Patient is a 57 year old male that presents this date voluntary to [facility] by [facility] after he contacted law enforcement [place] after he reported "over 100 snipers are trying to kill him." Patient denies any S/I or H/I. Patient does report ongoing AVH although is vague in reference to content stating he "sees and hears everything." Patient denies any previous mental health history or receives any OP service. Patient reports using up to one gram of, "Ice" daily for the last two years.  How Long Has This Been Causing You Problems? > than 6 months  What Do You Feel Would Help You the Most Today? -- (Patient is unsure why he is here and feels he "just needs protection from the snipers," who are after him)   Have You Recently Had Any Thoughts About Hurting Yourself? No  Are You Planning to Commit Suicide/Harm  Yourself At This time? No   Flowsheet Row ED from 09/17/2022 in [facility] ED from 07/03/2022 in [facility] Emergency Department at [facility] to Hosp-Admission (Discharged) from 01/17/2022 in [facility] 3 [place] General Surgery  C-SSRS RISK CATEGORY No Risk No Risk No Risk       Have  you Recently Had Thoughts About Hurting Someone Karolee Ohs? No  Are You Planning to Harm Someone at This Time? No  Explanation: NA   Have You Used Any Alcohol or Drugs in the Past 24 Hours? Yes  What Did You Use and How Much? Patient reports using one half gram of ICE prior to arrival   Do You Currently Have a Therapist/Psychiatrist? No  Name of Therapist/Psychiatrist: Name of Therapist/Psychiatrist: NA   Have You Been Recently Discharged From Any Office Practice or Programs? No  Explanation of Discharge From Practice/Program: NA     CCA Screening Triage Referral Assessment Type of Contact: Face-to-Face  Telemedicine Service Delivery:   Is this Initial or Reassessment?   Date Telepsych consult ordered in [facility]:    Time Telepsych consult ordered in [facility]:    Location of Assessment: [facility] Assessment Services  Provider Location: [facility] Assessment Services   Collateral Involvement: None at this time   Does Patient Have a Automotive engineer Guardian? No  Legal Guardian Contact Information: NA  Copy of Legal Guardianship Form: -- (NA)  Legal Guardian Notified of Arrival: -- (NA)  Legal Guardian Notified of Pending Discharge: -- (NA)  If Minor and Not Living with Parent(s), Who has Custody? NA  Is CPS involved or ever been involved? Never  Is APS involved or ever been involved? Never   Patient Determined To Be At Risk for Harm To Self or Others Based on Review of Patient Reported Information or Presenting Complaint? No  Method: No Plan  Availability of Means: No access or NA  Intent: Vague intent or NA  Notification Required: No need or identified person  Additional Information for Danger to Others Potential: -- (NA)  Additional Comments for Danger to Others Potential: NA  Are There Guns or Other Weapons in Your Home? No  Types of Guns/Weapons: Pt denies access to firearms  Are These Weapons Safely Secured?                            -- (NA)  Who Could  Verify You Are Able To Have These Secured: NA  Do You Have any Outstanding Charges, Pending Court Dates, Parole/Probation? Patient denies any current legal issues  Contacted To Inform of Risk of Harm To Self or Others: Other: Comment (NA)    Does Patient Present under Involuntary Commitment? No    [place] of Residence: [place]   Patient Currently Receiving the Following Services: Not Receiving Services   Determination of Need: Urgent (48 hours)   Options For Referral: -- (Continuious assessments)     CCA Biopsychosocial Patient Reported Schizophrenia/Schizoaffective Diagnosis in Past: No   Strengths: Patient cannot identify any strengths due to current AMS   Mental Health Symptoms Depression:   None   Duration of Depressive symptoms:    Mania:   None   Anxiety:    None   Psychosis:   Hallucinations   Duration of Psychotic symptoms:  Duration of Psychotic Symptoms: Less than six months   Trauma:   None   Obsessions:   None   Compulsions:   None  Inattention:   None   Hyperactivity/Impulsivity:   None   Oppositional/Defiant Behaviors:   None   Emotional Irregularity:   None   Other Mood/Personality Symptoms:   Patient denies any mental health symptoms beyond current AVH which he states, "is a part of something bigger."    Mental Status Exam Appearance and self-care  Stature:   Average   Weight:   Average weight   Clothing:   Disheveled   Grooming:   Bizarre   Cosmetic use:   None   Posture/gait:   Bizarre   Motor activity:   Restless   Sensorium  Attention:   Distractible; Confused   Concentration:   Anxiety interferes   Orientation:   Object; Person; Place   Recall/memory:   Defective in Immediate   Affect and Mood  Affect:   Anxious   Mood:   Anxious   Relating  Eye contact:   Fleeting   Facial expression:   Anxious   Attitude toward examiner:   Guarded; Suspicious   Thought and Language   Speech flow:  Articulation error   Thought content:   Appropriate to Mood and Circumstances   Preoccupation:   Other (Comment) (NA)   Hallucinations:   Auditory; Visual   Organization:   Disorganized   Transport planner of Knowledge:   Poor   Intelligence:   Needs investigation   Abstraction:   Abstract   Judgement:   Poor   Reality Testing:   Variable   Insight:   Poor   Decision Making:   Confused   Social Functioning  Social Maturity:   Responsible   Social Judgement:   Impropriety   Stress  Stressors:   Other (Comment) (Pt cannot identify)   Coping Ability:   Overwhelmed   Skill Deficits:   Activities of daily living (Due to SA issues)   Supports:   Support needed     Religion: Religion/Spirituality Are You A Religious Person?: No How Might This Affect Treatment?: NA  Leisure/Recreation: Leisure / Recreation Do You Have Hobbies?: No  Exercise/Diet: Exercise/Diet Do You Exercise?: No Have You Gained or Lost A Significant Amount of Weight in the Past Six Months?: No Do You Follow a Special Diet?: No Do You Have Any Trouble Sleeping?: Yes Explanation of Sleeping Difficulties: Pt states he hasnt "slept in years."   CCA Employment/Education Employment/Work Situation: Employment / Work Situation Employment Situation: Employed Work Stressors: Pt denies any current stressors at work Patient's Job has Been Impacted by Current Illness: No Has Patient ever Been in Passenger transport manager?: No  Education: Education Is Patient Currently Attending School?: No Last Grade Completed: 12 Did [place]?: No Did You Have An Individualized Education Program (IIEP): No Did You Have Any Difficulty At [employer]?: No Patient's Education Has Been Impacted by Current Illness: No   CCA Family/Childhood History Family and Relationship History: Family history Marital status: Single Does patient have children?: No  Childhood History:   Childhood History By whom was/is the patient raised?: Both parents Did patient suffer any verbal/emotional/physical/sexual abuse as a child?: No Did patient suffer from severe childhood neglect?: No Has patient ever been sexually abused/assaulted/raped as an adolescent or adult?: No Was the patient ever a victim of a crime or a disaster?: No Witnessed domestic violence?: No Has patient been affected by domestic violence as an adult?: No       CCA Substance Use Alcohol/Drug Use:  ASAM's:  Six Dimensions of Multidimensional Assessment  Dimension 1:  Acute Intoxication and/or Withdrawal Potential:      Dimension 2:  Biomedical Conditions and Complications:      Dimension 3:  Emotional, Behavioral, or Cognitive Conditions and Complications:     Dimension 4:  Readiness to Change:     Dimension 5:  Relapse, Continued use, or Continued Problem Potential:     Dimension 6:  Recovery/Living Environment:     ASAM Severity Score:    ASAM Recommended Level of Treatment:     Substance use Disorder (SUD)    Recommendations for Services/Supports/Treatments:    Discharge Disposition:    DSM5 Diagnoses: Patient Active Problem List   Diagnosis Date Noted   Delusion ([place]) 02/10/2022   Acute low back pain    Osteomyelitis (HCC)    Paraspinal abscess (HCC)    IVDU (intravenous drug user) 02/02/2022   Osteomyelitis of lumbar spine (HCC) 02/02/2022   Chronic pain 02/02/2022   Insomnia 02/02/2022   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   Constipation 02/02/2022   Infective endocarditis of tricuspid valve 02/02/2022   MSSA bacteremia 01/26/2022   Septic arthritis of lumbar spine ([place]) 01/17/2022   Controlled type 2 diabetes mellitus without complication, without long-term current use of insulin ([place]) 01/17/2022   Hypocalcemia 01/17/2022   Normocytic anemia 01/17/2022   Hyponatremia 01/17/2022   Hypomagnesemia 01/17/2022   Cellulitis of  right foot 05/17/2019   Tobacco abuse    Opioid dependence, uncomplicated (HCC) 37/05/6268   Periodontal disease 10/11/2017   Hepatitis C antibody positive in blood 10/05/2017   Elevated platelet count 10/05/2017   History of substance abuse ([place]) 09/20/2017   History of vitamin D deficiency 09/20/2017   Essential hypertension 09/20/2017     Referrals to Alternative Service(s): Referred to Alternative Service(s):   Place:   Date:   Time:    Referred to Alternative Service(s):   Place:   Date:   Time:    Referred to Alternative Service(s):   Place:   Date:   Time:    Referred to Alternative Service(s):   Place:   Date:   Time:     Mamie Nick, LCAS

## 2022-09-17 NOTE — ED Provider Notes (Cosign Needed Addendum)
[facility] Urgent Care Continuous Assessment Admission H&P  Date: 09/17/22 Patient Name: Tanner [name] MRN: [id number] Chief Complaint:   Diagnoses:  Final diagnoses:  Psychoactive substance-induced psychosis [facility])    HPI: Tanner [name] is a 57 year old male patient with a documented past psychiatric history significant for substance induced psychosis, methamphetamine use disorder, heroin use, and schizophrenia who presented to the [facility] behavioral health urgent care voluntary accompanied by law enforcement with complaints of paranoia. Patient seen and evaluated face-to-face by this provider, chart reviewed and case discussed with Dr. Dwyane Dee.  On evaluation, patient is alert and oriented to person, place, time, and situation. However his thought process is disorganized with circumstantial association. His speech is coherent. His mood is euthymic and affect is congruent. He is calm and cooperative. He does not appear to be in acute distress. He states that today he [place] out [place] the airport and the airport security was trying to kill him. He [place] airport. He verbalized to the triage counselor that 100 snipers were trying to kill him today. He states that this is the first time that anything like this has happened. He endorses auditory hallucinations that he describes as "sounds bleeding together that are not regular sounds." He denies visual hallucinations. There is no objective evidence that the patient is currently responding to internal or external stimuli. He reports using drugs to "get up." He reports using "clear or ice" known as methamphetamines daily via IV use for the past 7 years. He reports last using methamphetamines this morning around 7 or 8 AM. He denies using other illicit drugs. He denies drinking alcohol. He reports a "dozen different" past substance abuse treatments. He states that he last received substance abuse treatment in [place] about 6 or 7 years ago. He  reports poor sleep, and states that he about 2 hours last night. He reports a fair appetite. He states that he resides with his ex-boyfriend, friend, and ex-wife.  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  He denies outpatient psychiatry or counseling services at this time. He reports 1 past psychiatric hospitalization in [place] for psychosis when he was committed by his ex-wife while she was having an affair a few years ago. He denies legal issues. He reports a family psychiatric history of maternal grandfather was an alcoholic. He reports a medical history of diabetes type 2 and states that he stopped taking his metformin and Lantus a couple years ago. He states that he was previously prescribed metformin 1000 mg twice daily and Lantus 8 units daily. He denies physical complaints at this time.  Per chart review (09/22/21) at [place], it is documented that the patient was previously prescribed Seroquel 100 mg p.o. daily, trazodone 50 mg p.o. nightly, Lantus 13 units nightly, metformin 1000 mg p.o. twice daily, Vistaril 50 mg 3 times daily as needed, Haldol 10 mg twice daily, Cogentin 1 mg p.o. 3 times daily and Norvasc 5 mg p.o. daily.   Total Time spent with patient: 45 minutes  Musculoskeletal  Strength & Muscle Tone: within normal limits Gait & Station: normal Patient leans: N/A  Psychiatric Specialty Exam  Presentation General Appearance: Appropriate for Environment  Eye Contact:Fair  Speech:Clear and Coherent  Speech Volume:Normal  Mood and Affect  Mood:Euthymic  Affect:Congruent   Thought Process  Thought Processes:Disorganized  Descriptions of Associations:Circumstantial  Orientation:Full (Time, Place and Person)  Thought Content:Paranoid Ideation  Diagnosis of Schizophrenia  or Schizoaffective disorder in past: Yes  Duration of Psychotic Symptoms: N/A  Hallucinations:Hallucinations: Auditory  Ideas of  Reference:Paranoia  Suicidal Thoughts:Suicidal Thoughts: No  Homicidal Thoughts:Homicidal Thoughts: No   Sensorium  Memory:Immediate Fair  Judgment:Poor  Insight:Poor   Executive Functions  Concentration:Fair  Attention Span:Fair  [place]   Psychomotor Activity  Psychomotor Activity:Psychomotor Activity: Restlessness   Assets  Assets:Communication Skills; Desire for Improvement; Financial Resources/Insurance; Housing; Physical Health; Leisure Time   Sleep  Sleep:Sleep: Poor Number of Hours of Sleep: 2   Nutritional Assessment (For OBS and FBC admissions only) Has the patient had a weight loss or gain of 10 pounds or more in the last 3 months?: No Has the patient had a decrease in food intake/or appetite?: No Does the patient have dental problems?: No Does the patient have eating habits or behaviors that may be indicators of an eating disorder including binging or inducing vomiting?: No Has the patient recently lost weight without trying?: 0 Has the patient been eating poorly because of a decreased appetite?: 0 Malnutrition Screening Tool Score: 0    Physical Exam Cardiovascular:     Rate and Rhythm: Normal rate.  Pulmonary:     Effort: Pulmonary effort is normal.  Musculoskeletal:        General: Normal range of motion.     Cervical back: Normal range of motion.  Neurological:     Mental Status: He is alert and oriented to person, place, and time.    Review of Systems  Constitutional: Negative.   HENT: Negative.    Eyes: Negative.   Respiratory: Negative.    Cardiovascular: Negative.   Gastrointestinal: Negative.   Genitourinary: Negative.   Musculoskeletal: Negative.   Neurological: Negative.   Endo/Heme/Allergies: Negative.     Blood pressure (!) 143/78, pulse 71, temperature 97.6 F (36.4 C), resp. rate 18, height 5\' 7"  (1.702 m), weight 140 lb (63.5 kg), SpO2 97 %. Body mass index is 21.93  kg/m.  Past Psychiatric History: substance induced psychosis,   Is the patient at risk to self? No  Has the patient been a risk to self in the past 6 months? No .    Has the patient been a risk to self within the distant past? No   Is the patient a risk to others? No   Has the patient been a risk to others in the past 6 months? No   Has the patient been a risk to others within the distant past? No   Past Medical History: DM type 2 with out complications   Family History: No known history reported.   Social History: daily methamphetamine use. No alcohol use. Smokes cigarettes daily.   Last Labs:  No visits with results within 6 Month(s) from this visit.  Latest known visit with results is:  No results displayed because visit has over 200 results.      Allergies: Trazodone and nefazodone  Medications:  Facility Ordered Medications  Medication   acetaminophen (TYLENOL) tablet 650 mg   alum & mag hydroxide-simeth (MAALOX/MYLANTA) 200-200-20 MG/5ML suspension 30 mL   magnesium hydroxide (MILK OF MAGNESIA) suspension 30 mL   hydrOXYzine (ATARAX) tablet 25 mg   nicotine (NICODERM CQ - dosed in mg/24 hours) patch 21 mg   PTA Medications  Medication Sig   acetaminophen (TYLENOL) 325 MG tablet Take 2 tablets (650 mg total) by mouth every 6 (six) hours as needed for mild pain (or Fever >/= 101).  celecoxib (CELEBREX) 200 MG capsule Take 1 capsule (200 mg total) by mouth 2 (two) times daily.   insulin detemir (LEVEMIR FLEXTOUCH) 100 UNIT/ML FlexPen Inject 14 units each day at 10:00AM, and inject 22 units each day at 10:00PM   insulin aspart (NOVOLOG) 100 UNIT/ML FlexPen Inject 7 Units into the skin 3 (three) times daily with meals.   Insulin Pen Needle 32G X 4 MM MISC Use 5 (five) times daily.   blood glucose meter kit and supplies Dispense based on patient and insurance preference. Use up to four times daily as directed. (FOR ICD-10 E10.9, E11.9).   polyethylene glycol (MIRALAX /  GLYCOLAX) 17 g packet Take 17 g by mouth 2 (two) times daily as needed for mild constipation.   senna-docusate (SENOKOT-S) 8.6-50 MG tablet Take 1 tablet by mouth 2 (two) times daily.   cyclobenzaprine (FLEXERIL) 10 MG tablet Take 1 tablet (10 mg total) by mouth 3 (three) times daily.   gabapentin (NEURONTIN) 400 MG capsule Take 1 capsule (400 mg total) by mouth 3 (three) times daily.   oxyCODONE-acetaminophen (PERCOCET/ROXICET) 5-325 MG tablet Take 1-2 tablets by mouth every 4 (four) hours as needed for severe pain.    Medical Decision Making  Patient admitted to the [facility] behavioral health continuous assessment unit for overnight observation for psychosis in the context of methamphetamine use.  Labs Lab Orders         Resp panel by RT-PCR (RSV, Flu A&B, Covid) Anterior Nasal Swab         CBC with Differential/Platelet         Comprehensive metabolic panel         Hemoglobin A1c         Ethanol         Lipid panel         TSH         CBG monitoring         POC SARS Coronavirus 2 Ag-ED - Nasal Swab    EKG   Psychosis   Haldol 5 mg p.o. twice daily for psychosis Cogentin 0.5 mg p.o. twice daily for EPS/tremors  DM type 2 Will place diabetes consult for medication recommendations.   Recommendations  Based on my evaluation the patient does not appear to have an emergency medical condition.  Marissa Calamity, NP 09/17/22  6:27 PM

## 2022-09-17 NOTE — ED Notes (Signed)
Pt sleeping in recliner bed @ present time. RR even and unlabored. Will continue to monitor for safety

## 2022-09-17 NOTE — ED Notes (Signed)
OBS. VOL. Substance induce psychosis

## 2022-09-17 NOTE — Progress Notes (Signed)
   09/17/22 1710  [address] [address] (Walk-ins at [facility] only)  How Did You Hear About [place]? Self  What Is the Reason for Your Visit/Call Today? Patient is a 57 year old male that presents this date voluntary to [facility] by [employer] after he contacted law enforcement while "[place]" after he reported "over 100 snipers are trying to kill him." Patient denies any S/I or H/I. Patient does report ongoing AVH although is vague in reference to content stating he "sees and hears everything." Patient denies any previous mental health history or receives any OP service. Patient reports using up to one gram of, "Ice" daily for the last two years. Patient's reports his method of ingestion is by IV. Patient states his last use was earlier this date when he used about one half a gram. Patient denies the use of any other substances. Patient is observed to be anxious, guarded and is a poor historian. Patient states he resides alone in his own home although has "a few friends who stay with him." Patient denies access to firearms or current legal issues. 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  How Long Has This Been Causing You Problems? > than 6 months  Have You Recently Had Any Thoughts About Hurting Yourself? No  Are You Planning to Commit Suicide/Harm Yourself At This time? No  Have you Recently Had Thoughts About [place]? No  Are You Planning To Harm Someone At This Time? No  Are you currently experiencing any auditory, visual or other hallucinations? Yes  Please explain the hallucinations you are currently experiencing: Patient states he "sees and hears things all the time." Patient is vague in reference to content.  Have You Used Any Alcohol or Drugs in the Past 24 Hours? Yes  How long ago did you use Drugs or Alcohol? Patient reports using one half gram of ICE prior to arrival. Patient's method of ingestion is by IV  What Did You Use and How  Much? one half gram of Ice  Do you have any current medical co-morbidities that require immediate attention? No  Clinician description of patient physical appearance/behavior: Patient presents as guarded and suspicious  What Do You Feel Would Help You the Most Today?  (Patient is unsure of what services he is in need of)  If access to [facility] was not available, would you have sought care in the Emergency Department? No  Determination of Need Urgent (48 hours)  Options For Referral  (Continuious assessment)

## 2022-09-17 NOTE — ED Notes (Signed)
Pt awake. Alert/O x 2. Flat affect, soft spoken, minimal eye contact, denies SI/HI/AVH at this time. Pt is cooperative, denies c/o pain. Meds given. No noted distress. Will continue to monitor for safety

## 2022-09-17 NOTE — ED Notes (Signed)
Pt sleeping in recliner bed. RR even and unlabored. Will continue to monitor for safety 

## 2022-09-18 ENCOUNTER — Encounter (HOSPITAL_COMMUNITY): Payer: Self-pay

## 2022-09-18 DIAGNOSIS — F19959 Other psychoactive substance use, unspecified with psychoactive substance-induced psychotic disorder, unspecified: Secondary | ICD-10-CM | POA: Diagnosis not present

## 2022-09-18 LAB — RESP PANEL BY RT-PCR (RSV, FLU A&B, COVID)  RVPGX2
Influenza A by PCR: NEGATIVE
Influenza B by PCR: NEGATIVE
Resp Syncytial Virus by PCR: NEGATIVE
SARS Coronavirus 2 by RT PCR: NEGATIVE

## 2022-09-18 LAB — POCT URINE DRUG SCREEN - MANUAL ENTRY (I-SCREEN)
POC Amphetamine UR: POSITIVE — AB
POC Buprenorphine (BUP): NOT DETECTED
POC Cocaine UR: NOT DETECTED
POC Marijuana UR: POSITIVE — AB
POC Methadone UR: NOT DETECTED
POC Methamphetamine UR: POSITIVE — AB
POC Morphine: NOT DETECTED
POC Oxazepam (BZO): NOT DETECTED
POC Oxycodone UR: NOT DETECTED
POC Secobarbital (BAR): NOT DETECTED

## 2022-09-18 LAB — GLUCOSE, CAPILLARY: Glucose-Capillary: 75 mg/dL (ref 70–99)

## 2022-09-18 NOTE — Progress Notes (Signed)
Patient has been denied by [facility] due to no appropriate beds available. Patient meets [place] inpatient criteria per Thomes Lolling, NP. Patient has been faxed to the following facilities:   [facility]  [address] [place] [address] [phone] [address] [place] [address] [phone] [place]  [place], [place] [address] [phone]  [facility]  [address] [place] [address] [phone] [place]  [address] [place] [address] [phone]  [facility]  [address] [place] [address] [phone] [place]  [address] [place] [address] [phone]  [facility]  [place], [place] [address] [phone]  [facility]  [address] [place], [place] [address] [phone]  [facility]  [address] [place] [address] [place]  [facility]  [address] [place] [address] [phone]  [facility]  [address], [place]  [address] [phone] [place], MSW, LCSW-A  5:07 PM 09/18/2022

## 2022-09-18 NOTE — ED Notes (Signed)
Pt resting quietly, breathing is even and unlabored.  Pt denies SI, HI, pain and AVH.  He took morning medication without issue.  When speaking with this writer pt was brief in answers.  Will continue to monitor for safety.

## 2022-09-18 NOTE — ED Notes (Signed)
Patient came to nursing station asking to go home. I advised I will have the provider to come speak with him.
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
## 2022-09-18 NOTE — ED Notes (Signed)
Pt resting at this hour. No apparent distress. RR even and unlabored. Monitored for safety.

## 2022-09-18 NOTE — ED Notes (Signed)
Pt. Sleeping in recliner bed. Easily aroused. Denies SI/HI/AVH. Pt is guarded, soft spoken. A/O to self and place. No noted distress. Will continue to monitor for safety

## 2022-09-18 NOTE — ED Notes (Signed)
Pt resting quietly.  Pt given lunch.  Breathing is even and unlabored.  Will continue to monitor for safety.

## 2022-09-18 NOTE — ED Notes (Signed)
Pt provided urine sample for UDS.  Urine showed positive for amphetamines, meth, and THC.

## 2022-09-18 NOTE — ED Notes (Signed)
Pt under review at [facility].  Due to distance pt declined to go to facility. Pt reports he is worried about his job and wife.

## 2022-09-19 DIAGNOSIS — F19959 Other psychoactive substance use, unspecified with psychoactive substance-induced psychotic disorder, unspecified: Secondary | ICD-10-CM | POA: Diagnosis not present

## 2022-09-19 MED ORDER — BACITRACIN ZINC 500 UNIT/GM EX OINT: 1.0000 | TOPICAL_OINTMENT | Freq: Three times a day (TID) | CUTANEOUS | 0 refills | Status: DC | PRN

## 2022-09-19 MED ORDER — INSULIN DETEMIR 100 UNIT/ML ~~LOC~~ SOLN: 14.0000 [IU] | Freq: Every day | SUBCUTANEOUS | 11 refills | Status: DC

## 2022-09-19 MED ORDER — HALOPERIDOL 5 MG PO TABS: 5.0000 mg | ORAL_TABLET | Freq: Two times a day (BID) | ORAL | 0 refills | Status: DC

## 2022-09-19 MED ORDER — METFORMIN HCL 500 MG PO TABS: 500.0000 mg | ORAL_TABLET | Freq: Two times a day (BID) | ORAL | 0 refills | Status: DC

## 2022-09-19 MED ORDER — BENZTROPINE MESYLATE 0.5 MG PO TABS: 0.5000 mg | ORAL_TABLET | Freq: Two times a day (BID) | ORAL | 0 refills | Status: DC

## 2022-09-19 NOTE — ED Provider Notes (Signed)
FBC/OBS ASAP Discharge Summary  Date and Time: 09/19/2022 11:39 AM  Name: Tanner [name]  MRN:  [id number]   Discharge Diagnoses:  Final diagnoses:  Psychoactive substance-induced psychosis ([place])    Subjective:  Tanner [name] is a 57 year old patient presented who initially presented  to [facility] as a walk in voluntarily via [employer] with complaints of paranoia on 09/17/2022. He was admitted to the continuous assessment unit for overnight assessment.     [name], 57 y.o., male patient seen face to face by this provider and chart reviewed on 09/19/22. Per chart review patient has a past psychiatric history significant for substance induced psychosis, methamphetamine use disorder, heroin use, and schizophrenia.   Upon admission UDS is positive for amphetamines, methamphetamines, and marijuana.  EtOH is negative.  On today's reevaluation patient is observed laying in his bed asleep. He is easily awakened.  Reports he is feeling much better and is requesting to be discharged.  He has normal speech and behavior.  He is alert/oriented x 4, cooperative, calm, and attentive.  Reports he has been able to get some well needed sleep while on the unit.  He is denying depression.  He has a dysphoric affect.  He is denying SI/HI/AVH.  He verbally contracts for safety.  He denies access to firearms.  He does have access to knives but states he has ex-wife and friend has possession of those.  Collateral could not be obtained from ex-wife and friend as they do not have a cell phone.  However this writer was able to make contact with patient's brother Tanner [name].  Patient is denying paranoia at this time.  States he realizes that after he uses methamphetamines and makes him that way.  He does not appear manic or psychotic.  He is able to converse coherently, has goal-directed thoughts, and he shows no signs of distractibility.  He does not appear to be responding to internal/external stimuli.  Collateral Greogory Cornette  (brother) [phone].  He has no immediate safety concerns with patient being discharged home.  He confirms patient does not have access to firearms.  States patient only gets this way after he uses methamphetamines but he usually clears up in 1-2 days.  Stay Summary: Tanner [name] was admitted to observation for stimulant induced psychosis and crisis management.  He was treated with Haldol 5 mg BID, which was tolerated with no adverse reactions.  [name] was discharged with current medications and was instructed on how to take medications as prescribed; (details listed below under Medication List).     Improvement was monitored by observation and [name] report of symptom reduction; PATIENT STATEMENT OF DOING BETTER.  Emotional and mental status was also monitored by staff.          Tanner [name] was evaluated for stability and plans for continued recovery upon discharge. Employment, transportation, bed availability, health status, family support, and any pending legal issues were also considered during his admission.  He was offered further treatment options upon discharge including but not limited to Residential, Intensive Outpatient, Outpatient treatment, and resources for shelters if needed.   [name] will follow up with the services as listed below under Follow up Information.  Discussed the importance of follow up with PCP and psychiatry. Patient verbalized understanding.   Upon completion of this admission the Tanner [name] was both mentally and medically stable for discharge denying suicidal/homicidal ideation, auditory/visual/tactile hallucinations, delusional thoughts and paranoia.       Total Time  spent with patient: 30 minutes  Past Psychiatric History: as documented in H&P Past Medical History: as documented in H&P Family History:as documented in H&P Family Psychiatric History: as documented in H&P Social History: as documented in H&P Tobacco Cessation:  A  prescription for an FDA-approved tobacco cessation medication was offered at discharge and the patient refused  Current Medications:  Current Facility-Administered Medications  Medication Dose Route Frequency Provider Last Rate Last Admin   acetaminophen (TYLENOL) tablet 650 mg  650 mg Oral Q6H PRN White, Patrice L, NP       alum & mag hydroxide-simeth (MAALOX/MYLANTA) 200-200-20 MG/5ML suspension 30 mL  30 mL Oral Q4H PRN White, Patrice L, NP       bacitracin ointment 1 Application  1 Application Topical TID PRN White, Patrice L, NP       benztropine (COGENTIN) tablet 0.5 mg  0.5 mg Oral BID White, Patrice L, NP   0.5 mg at 09/19/22 0914   haloperidol (HALDOL) tablet 5 mg  5 mg Oral BID White, Patrice L, NP   5 mg at 09/19/22 0914   hydrOXYzine (ATARAX) tablet 25 mg  25 mg Oral TID PRN Darrol Angel L, NP   25 mg at 09/18/22 2147   insulin detemir (LEVEMIR) injection 14 Units  14 Units Subcutaneous QHS Ajibola, Ene A, NP   14 Units at 09/18/22 2148   magnesium hydroxide (MILK OF MAGNESIA) suspension 30 mL  30 mL Oral Daily PRN White, Patrice L, NP       metFORMIN (GLUCOPHAGE) tablet 500 mg  500 mg Oral BID WC Ajibola, Ene A, NP   500 mg at 09/19/22 0914   nicotine (NICODERM CQ - dosed in mg/24 hours) patch 21 mg  21 mg Transdermal Daily White, Patrice L, NP   21 mg at 09/18/22 0908   Current Outpatient Medications  Medication Sig Dispense Refill   bacitracin ointment Apply 1 Application topically 3 (three) times daily as needed for wound care. 120 g 0   benztropine (COGENTIN) 0.5 MG tablet Take 1 tablet (0.5 mg total) by mouth 2 (two) times daily. 60 tablet 0   haloperidol (HALDOL) 5 MG tablet Take 1 tablet (5 mg total) by mouth 2 (two) times daily. 60 tablet 0   insulin detemir (LEVEMIR) 100 UNIT/ML injection Inject 0.14 mLs (14 Units total) into the skin at bedtime. 10 mL 11   metFORMIN (GLUCOPHAGE) 500 MG tablet Take 1 tablet (500 mg total) by mouth 2 (two) times daily with a meal. 60  tablet 0    PTA Medications:  Facility Ordered Medications  Medication   acetaminophen (TYLENOL) tablet 650 mg   alum & mag hydroxide-simeth (MAALOX/MYLANTA) 200-200-20 MG/5ML suspension 30 mL   magnesium hydroxide (MILK OF MAGNESIA) suspension 30 mL   hydrOXYzine (ATARAX) tablet 25 mg   nicotine (NICODERM CQ - dosed in mg/24 hours) patch 21 mg   haloperidol (HALDOL) tablet 5 mg   benztropine (COGENTIN) tablet 0.5 mg   bacitracin ointment 1 Application   metFORMIN (GLUCOPHAGE) tablet 500 mg   insulin detemir (LEVEMIR) injection 14 Units   PTA Medications  Medication Sig   bacitracin ointment Apply 1 Application topically 3 (three) times daily as needed for wound care.   benztropine (COGENTIN) 0.5 MG tablet Take 1 tablet (0.5 mg total) by mouth 2 (two) times daily.   haloperidol (HALDOL) 5 MG tablet Take 1 tablet (5 mg total) by mouth 2 (two) times daily.   insulin detemir (LEVEMIR) 100 UNIT/ML injection  Inject 0.14 mLs (14 Units total) into the skin at bedtime.   metFORMIN (GLUCOPHAGE) 500 MG tablet Take 1 tablet (500 mg total) by mouth 2 (two) times daily with a meal.        No data to display          Flowsheet Row ED from 09/17/2022 in [facility] ED from [facility]/03/2022 in [facility] Emergency Department at [facility] ED to Hosp-Admission (Discharged) from 01/17/2022 in [facility] [place] General Surgery  C-SSRS RISK CATEGORY No Risk No Risk No Risk       Musculoskeletal  Strength & Muscle Tone: within normal limits Gait & Station: normal Patient leans: N/A  Psychiatric Specialty Exam  Presentation  General Appearance:  Casual  Eye Contact: Fair  Speech: Clear and Coherent; Normal Rate  Speech Volume: Normal  Handedness: Right   Mood and Affect  Mood: Anxious  Affect: Congruent   Thought Process  Thought Processes: Coherent  Descriptions of Associations:Intact  Orientation:Full (Time, Place and Person)  Thought  Content:Paranoid Ideation; Delusions  Diagnosis of Schizophrenia or Schizoaffective disorder in past: Yes  Duration of Psychotic Symptoms: Greater than six months   Hallucinations:Hallucinations: None  Ideas of Reference:None  Suicidal Thoughts:Suicidal Thoughts: No  Homicidal Thoughts:Homicidal Thoughts: No   Sensorium  Memory: Immediate Fair; Recent Fair; Remote Fair  Judgment: Poor  Insight: Poor   Executive Functions  Concentration: Fair  Attention Span: Fair  Recall: Fair  Fund of Knowledge: Fair  Language: Good   Psychomotor Activity  Psychomotor Activity: Psychomotor Activity: Normal   Assets  Assets: Physical Health; Resilience; Social Support; Leisure Time   Sleep  Sleep: Sleep: Poor   No data recorded  Physical Exam  Physical Exam Vitals and nursing note reviewed.  Constitutional:      General: He is not in acute distress.    Appearance: He is well-developed.  HENT:     Head: Normocephalic and atraumatic.  Eyes:     General:        Right eye: No discharge.        Left eye: No discharge.  Cardiovascular:     Rate and Rhythm: Normal rate.  Pulmonary:     Effort: Pulmonary effort is normal. No respiratory distress.  Musculoskeletal:        General: Normal range of motion.     Cervical back: Normal range of motion.  Skin:    Capillary Refill: Capillary refill takes less than 2 seconds.     Coloration: Skin is not jaundiced or pale.  Neurological:     Mental Status: He is alert and oriented to person, place, and time.  Psychiatric:        Attention and Perception: Attention and perception normal.        Mood and Affect: Mood is anxious.        Speech: Speech normal.        Behavior: Behavior is cooperative.        Thought Content: Thought content is paranoid.        Cognition and Memory: Cognition normal.        Judgment: Judgment normal.    Review of Systems  Constitutional: Negative.   HENT: Negative.    Eyes:  Negative.   Respiratory: Negative.    Cardiovascular: Negative.   Musculoskeletal: Negative.   Skin: Negative.   Neurological: Negative.   Psychiatric/Behavioral:  Positive for substance abuse. The patient is nervous/anxious.    Blood pressure 137/84,  pulse 62, temperature (!) 97.4 F (36.3 C), temperature source Tympanic, resp. rate 18, height 5\' 7"  (1.702 m), weight 140 lb (63.5 kg), SpO2 99 %. Body mass index is 21.93 kg/m.  Demographic Factors:  Male, Caucasian, and Low socioeconomic status  Loss Factors: Financial problems/change in socioeconomic status  Historical Factors: Impulsivity  Risk Reduction Factors:   Sense of responsibility to family, Employed, Living with another person, especially a relative, Positive social support, Positive therapeutic relationship, and Positive coping skills or problem solving skills  Continued Clinical Symptoms:  Depression:   Comorbid alcohol abuse/dependence Impulsivity Alcohol/Substance Abuse/Dependencies Previous Psychiatric Diagnoses and Treatments  Cognitive Features That Contribute To Risk:  None    Suicide Risk:  Minimal: No identifiable suicidal ideation.  Patients presenting with no risk factors but with morbid ruminations; may be classified as minimal risk based on the severity of the depressive symptoms  Plan Of Care/Follow-up recommendations:  Activity:  as tolerated  Diet:  regular   Disposition:   Discharge patient   Provided metformin 500 mg BID, Cogentin 0.5 mg BID, and Haldol 5 mg bid.- 30 day printed prescription for each medication. Patient has Levemir at home and declines prescription for this medication.  Provided outpatient psychiatric resources for medication management and therapy.  Provided resources for [facility] health and wellness for primary care needs.  Revonda Humphrey, NP 09/19/2022, 11:39 AM

## 2022-09-19 NOTE — Discharge Instructions (Addendum)
Please follow up with resources provided.   The suicide prevention education provided includes the following: Suicide risk factors Suicide prevention and interventions National Suicide Hotline telephone number [facility] assessment telephone number [place] and/or Residential Mobile Crisis Unit telephone number  Remove weapons (e.g., guns, rifles, knives), all items previously/currently identified as safety concern.   Remove drugs/medications (over the counter, prescriptions, illicit drugs), all items previously/currently identified as a safety concern.    Substance Abuse Treatment Programs  Intensive Outpatient Programs [facility]     [address], [place]       [facility] [address] [place], [place]  [facility]     (Inpatient and outpatient)     [address]           [place] [phone] (Suboxone and Methadone)  [place], [place] [address]      [place] [address] [place], [place]  [facility] [place] (Outpatient/Inpatient, Chemical)    (insurance only) [phone]             [facility] ([place]) [place], [place]     [facility]     [address] [place] [address]     [place], [place]       [facility] (for caregivers and family) [phone] [address] Kristeen [place]. [place], [place]      Residential Treatment Programs [facility]      [address] [place], [place] [address]  [phone]       [facility] [place], [place] [address] [phone]  [facility] [place]        [phone]       [facility] [place] [phone]  [facility] ([place]             [place], [place] or [place] [address] [place], [address] [phone]  [facility]    [address]      [place], [place]       [facility] [address] [place], [place]  [place]   [address] [place], [place] [address]     [phone]      Admissions: 8am-3pm M-F  [facility] ([facility]) [address] [place], [place]  BATS  Program: Residential Program ([address])   [place], [place] or [phone]     [facility]: [place], [place] (Walk in Hours over the weekend or by referral)  [facility] [place], [place], [place] [address] [phone]  Crisis Mobile: [facility]:  [phone] (for crisis response 24 hours a day) [facility] Hotline:      [phone] Outpatient Psychiatry and Counseling  [facility]: Mobile Crisis Management 24 hours:  [phone]  [facility] [employer] sliding scale fee and walk in schedule: M-F 8am-12pm/1pm-3pm [place], [place] [address] [place], [place] [address] [phone]  [facility])- new patient walk-in appointments available Monday - Friday 8am -3pm.          [address] [place], [place] [address] [phone] or crisis line- [facility]/ Intensive Outpatient Therapy Program [place], [place] [address] [place]      [phone] [address] [place], [place]  [address]                 [place]   [facility] [phone]. [place], [place] [address]   [employer] of Care          [address] Johnette Abraham  [place], [place] [address]       [phone]  [place], [place], [place] [address] [phone]  [facility]    [address] [place], [place] [address]     [place], [place] Joycelyn Man     [place] [address]     [phone]       [facility] [place] [address]  [facility]     [address], [place], [place], [place] [address] [place]     [address]     [place], [place] [address]     [phone]       [facility] [address] [place], [place] [address] [phone] Resources for Temporary Residential Assistance/Crisis [facility]) M-F 8am-3pm   [address] [place], [place] [address]   [phone] Services include: laundry, barbering, support groups, case management, phone  & computer access, showers, AA/NA mtgs, mental health/substance abuse nurse, job skills class, disability information, VA assistance, spiritual classes, etc.   [facility]   [address] [place]     [place] (women and children)       [place]. [place], [place] [address] [phone] [email or web address] for application and process Application Required  [facility] [place]  [address]    [place] [address]     [phone]                    [place]  [place], [place] [address] [phone](DXAJOINO application appt.) Application Required  [facility] (women only)    [address]     [place], [place] [address]     [phone]      Intake starts 6pm daily Need valid ID, SSC, & Police report [employer] [address] [place], [place] [phone] Application Required  [employer] (men only)     [place].      [place], [place]       [place] (Pregnant women only) [address] [place], [place]  [facility]      [place] [address]i [address].      [place], [place] [address]     [phone]             [facility] [address] [place], [place] 90 day commitment/SA/Application process  [facility](men only)     [address]     [place], [place]       Check-in at [facility] [address] [place], [place] [address] [phone] Men/Women/Women and Children must be there by 7 pm  [place], [place]

## 2022-09-19 NOTE — ED Notes (Signed)
Pt sleeping in recliner bed. RR even and unlabored. No noted distress. Will continue to monitor for safety 

## 2022-09-19 NOTE — ED Notes (Signed)
Pt's fingers are to hard to get an CBG on him this morning

## 2022-09-19 NOTE — ED Notes (Signed)
Patient is calmly lying in bed, no distress noted, respirations are even and unlabored. Patient came to nurses station and asked will someone be calling an uber for him as he was asked to stay one additional night which was last night and then he would be permitted to leave. The provider has been made aware of the patients request.

## 2022-09-19 NOTE — ED Notes (Signed)
Pt is currently sleeping, no distress noted, environmental check complete, will continue to monitor patient for safety. ? ?

## 2022-09-19 NOTE — ED Notes (Signed)
Patient is up and has been given breakfast

## 2022-09-19 NOTE — ED Notes (Signed)
Pt up to bathroom and returned to bed. No noted distress. Will continue to monitor.

## 2023-01-24 ENCOUNTER — Encounter (HOSPITAL_BASED_OUTPATIENT_CLINIC_OR_DEPARTMENT_OTHER): Payer: Self-pay

## 2023-01-24 ENCOUNTER — Other Ambulatory Visit: Payer: Self-pay

## 2023-01-24 ENCOUNTER — Emergency Department (HOSPITAL_BASED_OUTPATIENT_CLINIC_OR_DEPARTMENT_OTHER)
Admission: EM | Admit: 2023-01-24 | Discharge: 2023-01-24 | Disposition: A | Discharge status: Home / Self Care | Payer: Self-pay | Attending: Emergency Medicine | Admitting: Emergency Medicine

## 2023-01-24 DIAGNOSIS — Z794 Long term (current) use of insulin: Secondary | ICD-10-CM | POA: Insufficient documentation

## 2023-01-24 DIAGNOSIS — R21 Rash and other nonspecific skin eruption: Secondary | ICD-10-CM | POA: Insufficient documentation

## 2023-01-24 DIAGNOSIS — F17210 Nicotine dependence, cigarettes, uncomplicated: Secondary | ICD-10-CM | POA: Insufficient documentation

## 2023-01-24 DIAGNOSIS — E119 Type 2 diabetes mellitus without complications: Secondary | ICD-10-CM | POA: Insufficient documentation

## 2023-01-24 MED ORDER — DOXYCYCLINE HYCLATE 100 MG PO CAPS: 100.0000 mg | ORAL_CAPSULE | Freq: Two times a day (BID) | ORAL | 0 refills | Status: AC

## 2023-01-24 MED ORDER — DOXYCYCLINE HYCLATE 100 MG PO TABS
100.0000 mg | ORAL_TABLET | Freq: Once | ORAL | Status: AC
  Administered 2023-01-24: 100 mg via ORAL
  Filled 2023-01-24: qty 1

## 2023-01-24 NOTE — ED Triage Notes (Signed)
Pt reports rash that is getting worse. Pt has healing wounds on both legs, both arms, and pt states it is not on his trunkal area. Pt also reports a 'cyst' on back on LT upper neck. Pt picks the sores and he says white warm fluid comes out. Unsure where they are coming from. No fevers, no other sx.

## 2023-01-24 NOTE — ED Provider Notes (Signed)
[facility] Provider Note   CSN: [id number] Arrival date & time: 01/24/23  2151     History  Chief Complaint  Patient presents with   Rash    Tanner [name] is a 57 y.o. male.  Patient here for evaluation for some wounds on his arms and legs.  Denies any fevers or chills.  Nothing makes it worse or better.  History of diabetes and tobacco abuse.  Denies any chest pain or shortness of breath.  The history is provided by the patient.       Home Medications Prior to Admission medications   Medication Sig Start Date End Date Taking? Authorizing Provider  doxycycline (VIBRAMYCIN) 100 MG capsule Take 1 capsule (100 mg total) by mouth 2 (two) times daily for 7 days. 01/24/23 01/31/23 Yes Tanner Sultan, DO  bacitracin ointment Apply 1 Application topically 3 (three) times daily as needed for wound care. 09/19/22   Tanner Hughs, NP  benztropine (COGENTIN) 0.5 MG tablet Take 1 tablet (0.5 mg total) by mouth 2 (two) times daily. 09/19/22   Tanner Hughs, NP  haloperidol (HALDOL) 5 MG tablet Take 1 tablet (5 mg total) by mouth 2 (two) times daily. 09/19/22   Tanner Hughs, NP  insulin detemir (LEVEMIR) 100 UNIT/ML injection Inject 0.14 mLs (14 Units total) into the skin at bedtime. 09/19/22   Tanner Hughs, NP  metFORMIN (GLUCOPHAGE) 500 MG tablet Take 1 tablet (500 mg total) by mouth 2 (two) times daily with a meal. 09/19/22   Tanner Hughs, NP      Allergies    Trazodone and nefazodone    Review of Systems   Review of Systems  Physical Exam Updated Vital Signs BP (!) 171/84 (BP Location: Left Arm)   Pulse 73   Temp 98.4 F (36.9 C)   Resp 18   Ht 6\' 1"  (1.854 m)   Wt 72.3 kg   SpO2 100%   BMI 21.03 kg/m  Physical Exam Vitals and nursing note reviewed.  Constitutional:      General: He is not in acute distress.    Appearance: He is well-developed.  HENT:     Head: Normocephalic and atraumatic.  Eyes:      Conjunctiva/sclera: Conjunctivae normal.  Cardiovascular:     Rate and Rhythm: Normal rate and regular rhythm.     Heart sounds: No murmur heard. Pulmonary:     Effort: Pulmonary effort is normal. No respiratory distress.     Breath sounds: Normal breath sounds.  Abdominal:     Palpations: Abdomen is soft.     Tenderness: There is no abdominal tenderness.  Musculoskeletal:        General: No swelling.     Cervical back: Neck supple.  Skin:    General: Skin is warm and dry.     Capillary Refill: Capillary refill takes less than 2 seconds.     Comments: He is got several small areas of scabs and small wounds on his arms and legs but there is no major cellulitis or abscesses  Neurological:     Mental Status: He is alert.  Psychiatric:        Mood and Affect: Mood normal.     ED Results / Procedures / Treatments   Labs (all labs ordered are listed, but only abnormal results are displayed) Labs Reviewed - No data to display  EKG None  Radiology No results found.  Procedures Procedures  Medications Ordered in ED Medications  doxycycline (VIBRA-TABS) tablet 100 mg (100 mg Oral Given 01/24/23 2231)    ED Course/ Medical Decision Making/ A&P                             Medical Decision Making Risk Prescription drug management.   Tanner [name] is here for evaluation of scabs on his arms and legs.  Unremarkable vitals.  History of polysubstance abuse.  Diabetes.  Overall he is had some small areas of scabs and wounds but there is no signs of systemic infection or nature cellulitis or abscess.  Will prescribe doxycycline.  Recommend bacitracin or Neosporin.  Discharged condition.  This chart was dictated using voice recognition software.  Despite best efforts to proofread,  errors can occur which can change the documentation meaning.         Final Clinical Impression(s) / ED Diagnoses Final diagnoses:  Rash    Rx / DC Orders ED Discharge Orders           Ordered    doxycycline (VIBRAMYCIN) 100 MG capsule  2 times daily        01/24/23 2235              Virgina Norfolk, DO 01/24/23 2236

## 2023-01-24 NOTE — Discharge Instructions (Signed)
I would use bacitracin or Neosporin ointment over your wounds as well.  I have treated you with a systemic antibiotic to help clear up the infection.

## 2023-04-01 ENCOUNTER — Emergency Department (HOSPITAL_BASED_OUTPATIENT_CLINIC_OR_DEPARTMENT_OTHER)
Admission: EM | Admit: 2023-04-01 | Discharge: 2023-04-02 | Disposition: A | Discharge status: Home / Self Care | Payer: Self-pay | Attending: Emergency Medicine | Admitting: Emergency Medicine

## 2023-04-01 ENCOUNTER — Other Ambulatory Visit: Payer: Self-pay

## 2023-04-01 ENCOUNTER — Encounter (HOSPITAL_BASED_OUTPATIENT_CLINIC_OR_DEPARTMENT_OTHER): Payer: Self-pay

## 2023-04-01 DIAGNOSIS — E119 Type 2 diabetes mellitus without complications: Secondary | ICD-10-CM | POA: Insufficient documentation

## 2023-04-01 DIAGNOSIS — Z7984 Long term (current) use of oral hypoglycemic drugs: Secondary | ICD-10-CM | POA: Insufficient documentation

## 2023-04-01 DIAGNOSIS — Z794 Long term (current) use of insulin: Secondary | ICD-10-CM | POA: Insufficient documentation

## 2023-04-01 DIAGNOSIS — L989 Disorder of the skin and subcutaneous tissue, unspecified: Secondary | ICD-10-CM | POA: Insufficient documentation

## 2023-04-01 DIAGNOSIS — L0291 Cutaneous abscess, unspecified: Secondary | ICD-10-CM

## 2023-04-01 MED ORDER — DOXYCYCLINE HYCLATE 100 MG PO TABS
100.0000 mg | ORAL_TABLET | Freq: Once | ORAL | Status: AC
  Administered 2023-04-02: 100 mg via ORAL
  Filled 2023-04-01: qty 1

## 2023-04-01 MED ORDER — DOXYCYCLINE HYCLATE 100 MG PO CAPS: 100.0000 mg | ORAL_CAPSULE | Freq: Two times a day (BID) | ORAL | 0 refills | Status: DC

## 2023-04-01 NOTE — ED Provider Notes (Signed)
  [place] [facility] AT [facility] Provider Note   CSN: [id number] Arrival date & time: 04/01/23  2254     History  Chief Complaint  Patient presents with   Abscess    Tanner [name] Tanner a 57 y.o. [name].  Patient Tanner a 57 year old [name] with history of diabetes presenting with complaints of skin sores.  He has had these in the past and has been seen in the ER previously.  Antibiotics seem to help for a short time, then they seem to return.  Patient does describe some substance abuse.  No fevers or chills.  The history Tanner provided by the patient.       Home Medications Prior to Admission medications   Medication Sig Start Date End Date Taking? Authorizing Provider  bacitracin ointment Apply 1 Application topically 3 (three) times daily as needed for wound care. 09/19/22   Ardis Hughs, NP  benztropine (COGENTIN) 0.5 MG tablet Take 1 tablet (0.5 mg total) by mouth 2 (two) times daily. 09/19/22   Ardis Hughs, NP  haloperidol (HALDOL) 5 MG tablet Take 1 tablet (5 mg total) by mouth 2 (two) times daily. 09/19/22   Ardis Hughs, NP  insulin detemir (LEVEMIR) 100 UNIT/ML injection Inject 0.14 mLs (14 Units total) into the skin at bedtime. 09/19/22   Ardis Hughs, NP  metFORMIN (GLUCOPHAGE) 500 MG tablet Take 1 tablet (500 mg total) by mouth 2 (two) times daily with a meal. 09/19/22   Ardis Hughs, NP      Allergies    Trazodone and nefazodone    Review of Systems   Review of Systems  All other systems reviewed and are negative.   Physical Exam Updated Vital Signs BP (!) 144/93   Pulse 78   Temp 98 F (36.7 C) (Oral)   Resp 16   Ht 6' (1.829 m)   Wt 72.6 kg   SpO2 100%   BMI 21.70 kg/m  Physical Exam Vitals and nursing note reviewed.  Constitutional:      Appearance: Normal appearance.  Pulmonary:     Effort: Pulmonary effort Tanner normal.  Skin:    General: Skin Tanner warm and dry.     Comments: There are multiple ulcerated  lesions noted to the arms and legs.  There Tanner surrounding erythema, but no fluctuance.  Neurological:     Mental Status: He Tanner alert and oriented to person, place, and time.     ED Results / Procedures / Treatments   Labs (all labs ordered are listed, but only abnormal results are displayed) Labs Reviewed - No data to display  EKG None  Radiology No results found.  Procedures Procedures    Medications Ordered in ED Medications  doxycycline (VIBRA-TABS) tablet 100 mg (has no administration in time range)    ED Course/ Medical Decision Making/ A&P  Patient presenting with skin lesions as described in the HPI.  These have the appearance that Tanner suspicious for MRSA.  Patient to be treated with doxycycline and follow-up as needed.  Final Clinical Impression(s) / ED Diagnoses Final diagnoses:  None    Rx / DC Orders ED Discharge Orders     None         Geoffery Lyons, MD 04/01/23 2359

## 2023-04-01 NOTE — ED Triage Notes (Signed)
Pt has multiple abscesses and sores on his body. Pt states that he is diabetic and concerned because he doesn't heal like he used to. He reports that he has been seen here for same multiple times

## 2023-04-01 NOTE — Discharge Instructions (Addendum)
Begin taking doxycycline as prescribed.  Apply bacitracin to the lesions twice daily.  Follow-up with primary doctor if not improving in the next week.

## 2023-04-10 ENCOUNTER — Emergency Department (HOSPITAL_COMMUNITY)
Admission: EM | Admit: 2023-04-10 | Discharge: 2023-04-10 | Discharge status: Against Medical Advice | Payer: Self-pay | Attending: Emergency Medicine | Admitting: Emergency Medicine

## 2023-04-10 ENCOUNTER — Other Ambulatory Visit: Payer: Self-pay

## 2023-04-10 ENCOUNTER — Encounter (HOSPITAL_COMMUNITY): Payer: Self-pay

## 2023-04-10 DIAGNOSIS — F22 Delusional disorders: Secondary | ICD-10-CM | POA: Insufficient documentation

## 2023-04-10 DIAGNOSIS — Z794 Long term (current) use of insulin: Secondary | ICD-10-CM | POA: Insufficient documentation

## 2023-04-10 DIAGNOSIS — Z7984 Long term (current) use of oral hypoglycemic drugs: Secondary | ICD-10-CM | POA: Insufficient documentation

## 2023-04-10 DIAGNOSIS — Z5329 Procedure and treatment not carried out because of patient's decision for other reasons: Secondary | ICD-10-CM | POA: Insufficient documentation

## 2023-04-10 NOTE — ED Notes (Signed)
Patient said he was probably not going to stay and have any test done. He had a couple more phone calls to make and was very hungry and requested a meal tray before he left. Meal tray given. Ate 100%.

## 2023-04-10 NOTE — ED Notes (Signed)
Pt. Refused to have EKG done, he said he would after he got off the phone. Will attempt again.
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
## 2023-04-10 NOTE — ED Notes (Signed)
Patient refused EKG and blood work at this time. Stated,"I have to use the phone and make phone calls to see if l am even going to stay or leave. I have a friend here in the hospital and I may go visit them.  Oriented x 4. Denies S.I or H.I.  Denies hallucinations. No signs of distress.

## 2023-04-10 NOTE — ED Provider Notes (Signed)
[place] EMERGENCY DEPARTMENT AT [facility] Provider Note   CSN: [id number] Arrival date & time: 04/10/23  1216     History  Chief Complaint  Patient presents with   Psychiatric Evaluation    Tanner [name] is a 57 y.o. male.  Patient complains of being concerned that someone will harm him.  Patient reports that he called EMS to bring him to the emergency department because of this fear.  Patient states he is concerned that his dog is in his house and will become overheated.  Patient reports there is no air conditioning in the house.  Patient also states that he needs to speak to his wife because she does not know where he is.  Patient denies any suicidal or homicidal thoughts.  Patient reports having paranoid thoughts in the past.  Patient denies any current medical complaints.  He denies any fever or chills.  Patient states he does not feel like he needs to be in the hospital.  He does not feel like he will harm anyone or harm himself.  Patient reports he is taking his medications  The history is provided by the patient. No language interpreter was used.       Home Medications Prior to Admission medications   Medication Sig Start Date End Date Taking? Authorizing Provider  bacitracin ointment Apply 1 Application topically 3 (three) times daily as needed for wound care. 09/19/22   Ardis Hughs, NP  benztropine (COGENTIN) 0.5 MG tablet Take 1 tablet (0.5 mg total) by mouth 2 (two) times daily. 09/19/22   Ardis Hughs, NP  doxycycline (VIBRAMYCIN) 100 MG capsule Take 1 capsule (100 mg total) by mouth 2 (two) times daily. 04/01/23   Geoffery Lyons, MD  haloperidol (HALDOL) 5 MG tablet Take 1 tablet (5 mg total) by mouth 2 (two) times daily. 09/19/22   Ardis Hughs, NP  insulin detemir (LEVEMIR) 100 UNIT/ML injection Inject 0.14 mLs (14 Units total) into the skin at bedtime. 09/19/22   Ardis Hughs, NP  metFORMIN (GLUCOPHAGE) 500 MG tablet Take 1 tablet (500 mg  total) by mouth 2 (two) times daily with a meal. 09/19/22   Ardis Hughs, NP      Allergies    Trazodone and nefazodone    Review of Systems   Review of Systems  All other systems reviewed and are negative.   Physical Exam Updated Vital Signs BP (!) 165/84 (BP Location: Right Arm)   Pulse 80   Temp 97.7 F (36.5 C) (Oral)   Resp 16   Ht 6' (1.829 m)   Wt 72.6 kg   SpO2 99%   BMI 21.70 kg/m  Physical Exam Vitals and nursing note reviewed.  Constitutional:      General: He is not in acute distress.    Appearance: He is well-developed.  HENT:     Head: Normocephalic and atraumatic.  Eyes:     Conjunctiva/sclera: Conjunctivae normal.  Cardiovascular:     Rate and Rhythm: Normal rate and regular rhythm.     Heart sounds: No murmur heard. Pulmonary:     Effort: Pulmonary effort is normal. No respiratory distress.     Breath sounds: Normal breath sounds.  Abdominal:     Palpations: Abdomen is soft.     Tenderness: There is no abdominal tenderness.  Musculoskeletal:        General: No swelling.     Cervical back: Neck supple.  Skin:    General: Skin is  warm and dry.     Capillary Refill: Capillary refill takes less than 2 seconds.  Neurological:     Mental Status: He is alert.  Psychiatric:        Mood and Affect: Mood normal.     ED Results / Procedures / Treatments   Labs (all labs ordered are listed, but only abnormal results are displayed) Labs Reviewed  COMPREHENSIVE METABOLIC PANEL  ETHANOL  RAPID URINE DRUG SCREEN, HOSP PERFORMED  CBC WITH DIFFERENTIAL/PLATELET    EKG None  Radiology No results found.  Procedures Procedures    Medications Ordered in ED Medications - No data to display  ED Course/ Medical Decision Making/ A&P                                 Medical Decision Making Patient came to the emergency department because he is having some paranoid thoughts.  Amount and/or Complexity of Data Reviewed Independent Historian:  EMS    Details: Was brought in by EMS.  EMS reports patient is having having some paranoid thoughts Labs: ordered. Decision-making details documented in ED Course.    Details: Labs ordered.  Patient refused blood work  Risk Risk Details: Patient decided he did not want to stay for evaluation or to speak to TTS.  He is alert and seems competent to make this decision.  He is not suicidal or homicidal.  Patient left the department AMA           Final Clinical Impression(s) / ED Diagnoses Final diagnoses:  Paranoia [facility])    Rx / DC Orders ED Discharge Orders     None         Elson Areas, PA-C 04/10/23 1504    Elayne Snare K, DO 04/10/23 1515

## 2023-06-08 ENCOUNTER — Emergency Department (HOSPITAL_BASED_OUTPATIENT_CLINIC_OR_DEPARTMENT_OTHER)
Admission: EM | Admit: 2023-06-08 | Discharge: 2023-06-08 | Discharge status: Against Medical Advice | Payer: Self-pay | Attending: Emergency Medicine | Admitting: Emergency Medicine

## 2023-06-08 ENCOUNTER — Encounter (HOSPITAL_BASED_OUTPATIENT_CLINIC_OR_DEPARTMENT_OTHER): Payer: Self-pay | Admitting: Emergency Medicine

## 2023-06-08 DIAGNOSIS — R42 Dizziness and giddiness: Secondary | ICD-10-CM | POA: Insufficient documentation

## 2023-06-08 DIAGNOSIS — M542 Cervicalgia: Secondary | ICD-10-CM | POA: Insufficient documentation

## 2023-06-08 DIAGNOSIS — Z5321 Procedure and treatment not carried out due to patient leaving prior to being seen by health care provider: Secondary | ICD-10-CM | POA: Insufficient documentation

## 2023-06-08 DIAGNOSIS — R251 Tremor, unspecified: Secondary | ICD-10-CM | POA: Insufficient documentation

## 2023-06-08 NOTE — ED Notes (Addendum)
Pt Refused EKG 

## 2023-06-08 NOTE — ED Triage Notes (Signed)
Per EMS pt was at a [employer] was drinking coffee, pt jittery and glucose is high has Hx of same does not take meds for.  Per pt feels light headed and jitter with neck pain, states did not have a was to check glucose. States felt worse when he went outside.

## 2023-06-28 ENCOUNTER — Other Ambulatory Visit: Payer: Self-pay

## 2023-06-28 ENCOUNTER — Emergency Department (HOSPITAL_BASED_OUTPATIENT_CLINIC_OR_DEPARTMENT_OTHER)
Admission: EM | Admit: 2023-06-28 | Discharge: 2023-06-28 | Discharge status: Against Medical Advice | Payer: Self-pay | Attending: Emergency Medicine | Admitting: Emergency Medicine

## 2023-06-28 ENCOUNTER — Encounter (HOSPITAL_BASED_OUTPATIENT_CLINIC_OR_DEPARTMENT_OTHER): Payer: Self-pay | Admitting: Emergency Medicine

## 2023-06-28 DIAGNOSIS — R42 Dizziness and giddiness: Secondary | ICD-10-CM | POA: Insufficient documentation

## 2023-06-28 DIAGNOSIS — R55 Syncope and collapse: Secondary | ICD-10-CM | POA: Insufficient documentation

## 2023-06-28 DIAGNOSIS — Z5321 Procedure and treatment not carried out due to patient leaving prior to being seen by health care provider: Secondary | ICD-10-CM | POA: Insufficient documentation

## 2023-06-28 HISTORY — DX: Schizophrenia, unspecified: F20.9

## 2023-06-28 LAB — COMPREHENSIVE METABOLIC PANEL
ALT: 20 U/L (ref 0–44)
AST: 22 U/L (ref 15–41)
Albumin: 4 g/dL (ref 3.5–5.0)
Alkaline Phosphatase: 81 U/L (ref 38–126)
Anion gap: 9 (ref 5–15)
BUN: 38 mg/dL — ABNORMAL HIGH (ref 6–20)
CO2: 26 mmol/L (ref 22–32)
Calcium: 9.2 mg/dL (ref 8.9–10.3)
Chloride: 100 mmol/L (ref 98–111)
Creatinine, Ser: 1.11 mg/dL (ref 0.61–1.24)
GFR, Estimated: >60 mL/min (ref >60)
Glucose, Bld: 83 mg/dL (ref 70–99)
Potassium: 4.5 mmol/L (ref 3.5–5.1)
Sodium: 135 mmol/L (ref 135–145)
Total Bilirubin: 0.3 mg/dL (ref <1.2)
Total Protein: 7.4 g/dL (ref 6.5–8.1)

## 2023-06-28 LAB — CBC WITH DIFFERENTIAL/PLATELET
Abs Immature Granulocytes: 0.05 10*3/uL (ref 0.00–0.07)
Basophils Absolute: 0.1 10*3/uL (ref 0.0–0.1)
Basophils Relative: 1 %
Eosinophils Absolute: 0.1 10*3/uL (ref 0.0–0.5)
Eosinophils Relative: 1 %
HCT: 36.3 % — ABNORMAL LOW (ref 39.0–52.0)
Hemoglobin: 12.5 g/dL — ABNORMAL LOW (ref 13.0–17.0)
Immature Granulocytes: 1 %
Lymphocytes Relative: 20 %
Lymphs Abs: 1.9 10*3/uL (ref 0.7–4.0)
MCH: 29.1 pg (ref 26.0–34.0)
MCHC: 34.4 g/dL (ref 30.0–36.0)
MCV: 84.6 fL (ref 80.0–100.0)
Monocytes Absolute: 0.6 10*3/uL (ref 0.1–1.0)
Monocytes Relative: 7 %
Neutro Abs: 6.8 10*3/uL (ref 1.7–7.7)
Neutrophils Relative %: 70 %
Platelets: 374 10*3/uL (ref 150–400)
RBC: 4.29 MIL/uL (ref 4.22–5.81)
RDW: 13 % (ref 11.5–15.5)
WBC: 9.5 10*3/uL (ref 4.0–10.5)
nRBC: 0 % (ref 0.0–0.2)

## 2023-06-28 LAB — CBG MONITORING, ED: Glucose-Capillary: 81 mg/dL (ref 70–99)
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
## 2023-07-18 ENCOUNTER — Emergency Department (HOSPITAL_BASED_OUTPATIENT_CLINIC_OR_DEPARTMENT_OTHER)
Admission: EM | Admit: 2023-07-18 | Discharge: 2023-07-18 | Disposition: A | Discharge status: Home / Self Care | Payer: Self-pay | Attending: Emergency Medicine | Admitting: Emergency Medicine

## 2023-07-18 ENCOUNTER — Other Ambulatory Visit: Payer: Self-pay

## 2023-07-18 DIAGNOSIS — E119 Type 2 diabetes mellitus without complications: Secondary | ICD-10-CM | POA: Insufficient documentation

## 2023-07-18 DIAGNOSIS — M542 Cervicalgia: Secondary | ICD-10-CM | POA: Insufficient documentation

## 2023-07-18 DIAGNOSIS — Z794 Long term (current) use of insulin: Secondary | ICD-10-CM | POA: Insufficient documentation

## 2023-07-18 DIAGNOSIS — Z7984 Long term (current) use of oral hypoglycemic drugs: Secondary | ICD-10-CM | POA: Insufficient documentation

## 2023-07-18 DIAGNOSIS — I10 Essential (primary) hypertension: Secondary | ICD-10-CM | POA: Insufficient documentation

## 2023-07-18 MED ORDER — METHOCARBAMOL 500 MG PO TABS
1000.0000 mg | ORAL_TABLET | Freq: Once | ORAL | Status: AC
  Administered 2023-07-18: 1000 mg via ORAL
  Filled 2023-07-18: qty 2

## 2023-07-18 MED ORDER — KETOROLAC TROMETHAMINE 15 MG/ML IJ SOLN
15.0000 mg | Freq: Once | INTRAMUSCULAR | Status: AC
  Administered 2023-07-18: 15 mg via INTRAMUSCULAR
  Filled 2023-07-18: qty 1

## 2023-07-18 MED ORDER — LIDOCAINE 5 % EX PTCH: 1.0000 | MEDICATED_PATCH | CUTANEOUS | 0 refills | Status: DC

## 2023-07-18 MED ORDER — METHOCARBAMOL 500 MG PO TABS: 500.0000 mg | ORAL_TABLET | Freq: Two times a day (BID) | ORAL | 0 refills | Status: DC
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
## 2023-07-18 NOTE — ED Notes (Signed)
D/c paperwork reviewed with pt, including prescriptions and follow up care.  All questions and/or concerns addressed at time of d/c.  No further needs expressed. . Pt verbalized understanding, Ambulatory without assistance to ED exit, NAD.   
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
## 2023-11-03 ENCOUNTER — Emergency Department (HOSPITAL_COMMUNITY)
Admission: EM | Admit: 2023-11-03 | Discharge: 2023-11-06 | Disposition: A | Discharge status: Other Acute Inpatient Hospital | Payer: Self-pay | Attending: Emergency Medicine | Admitting: Emergency Medicine

## 2023-11-03 ENCOUNTER — Other Ambulatory Visit: Payer: Self-pay

## 2023-11-03 ENCOUNTER — Encounter (HOSPITAL_COMMUNITY): Payer: Self-pay

## 2023-11-03 DIAGNOSIS — F15150 Other stimulant abuse with stimulant-induced psychotic disorder with delusions: Secondary | ICD-10-CM | POA: Insufficient documentation

## 2023-11-03 DIAGNOSIS — F22 Delusional disorders: Secondary | ICD-10-CM | POA: Insufficient documentation

## 2023-11-03 DIAGNOSIS — F200 Paranoid schizophrenia: Secondary | ICD-10-CM

## 2023-11-03 DIAGNOSIS — F15950 Other stimulant use, unspecified with stimulant-induced psychotic disorder with delusions: Secondary | ICD-10-CM | POA: Insufficient documentation

## 2023-11-03 DIAGNOSIS — R739 Hyperglycemia, unspecified: Secondary | ICD-10-CM

## 2023-11-03 DIAGNOSIS — I10 Essential (primary) hypertension: Secondary | ICD-10-CM | POA: Insufficient documentation

## 2023-11-03 DIAGNOSIS — F411 Generalized anxiety disorder: Secondary | ICD-10-CM | POA: Insufficient documentation

## 2023-11-03 DIAGNOSIS — E1165 Type 2 diabetes mellitus with hyperglycemia: Secondary | ICD-10-CM | POA: Insufficient documentation

## 2023-11-03 LAB — COMPREHENSIVE METABOLIC PANEL
ALT: 33 U/L (ref 0–44)
AST: 31 U/L (ref 15–41)
Albumin: 4 g/dL (ref 3.5–5.0)
Alkaline Phosphatase: 98 U/L (ref 38–126)
Anion gap: 8 (ref 5–15)
BUN: 21 mg/dL — ABNORMAL HIGH (ref 6–20)
CO2: 26 mmol/L (ref 22–32)
Calcium: 9.1 mg/dL (ref 8.9–10.3)
Chloride: 97 mmol/L — ABNORMAL LOW (ref 98–111)
Creatinine, Ser: 0.78 mg/dL (ref 0.61–1.24)
GFR, Estimated: >60 mL/min (ref >60)
Glucose, Bld: 357 mg/dL — ABNORMAL HIGH (ref 70–99)
Potassium: 4 mmol/L (ref 3.5–5.1)
Sodium: 131 mmol/L — ABNORMAL LOW (ref 135–145)
Total Bilirubin: 0.5 mg/dL (ref 0.0–1.2)
Total Protein: 7 g/dL (ref 6.5–8.1)

## 2023-11-03 LAB — CBC WITH DIFFERENTIAL/PLATELET
Abs Immature Granulocytes: 0.01 10*3/uL (ref 0.00–0.07)
Basophils Absolute: 0 10*3/uL (ref 0.0–0.1)
Basophils Relative: 1 %
Eosinophils Absolute: 0.1 10*3/uL (ref 0.0–0.5)
Eosinophils Relative: 2 %
HCT: 35.9 % — ABNORMAL LOW (ref 39.0–52.0)
Hemoglobin: 12.2 g/dL — ABNORMAL LOW (ref 13.0–17.0)
Immature Granulocytes: 0 %
Lymphocytes Relative: 22 %
Lymphs Abs: 1 10*3/uL (ref 0.7–4.0)
MCH: 27.9 pg (ref 26.0–34.0)
MCHC: 34 g/dL (ref 30.0–36.0)
MCV: 82.2 fL (ref 80.0–100.0)
Monocytes Absolute: 0.4 10*3/uL (ref 0.1–1.0)
Monocytes Relative: 9 %
Neutro Abs: 3 10*3/uL (ref 1.7–7.7)
Neutrophils Relative %: 66 %
Platelets: 288 10*3/uL (ref 150–400)
RBC: 4.37 MIL/uL (ref 4.22–5.81)
RDW: 12.9 % (ref 11.5–15.5)
WBC: 4.6 10*3/uL (ref 4.0–10.5)
nRBC: 0 % (ref 0.0–0.2)

## 2023-11-03 LAB — RAPID URINE DRUG SCREEN, HOSP PERFORMED
Amphetamines: POSITIVE — AB
Barbiturates: NOT DETECTED
Benzodiazepines: NOT DETECTED
Cocaine: NOT DETECTED
Opiates: NOT DETECTED
Tetrahydrocannabinol: POSITIVE — AB

## 2023-11-03 LAB — ETHANOL: Alcohol, Ethyl (B): <10 mg/dL (ref <10)

## 2023-11-03 LAB — CBG MONITORING, ED: Glucose-Capillary: 180 mg/dL — ABNORMAL HIGH (ref 70–99)

## 2023-11-03 MED ORDER — PALIPERIDONE ER 6 MG PO TB24
6.0000 mg | ORAL_TABLET | Freq: Every day | ORAL | Status: DC
  Administered 2023-11-03 – 2023-11-05 (×3): 6 mg via ORAL
  Filled 2023-11-03 (×3): qty 1

## 2023-11-03 MED ORDER — METFORMIN HCL 500 MG PO TABS
500.0000 mg | ORAL_TABLET | Freq: Two times a day (BID) | ORAL | Status: DC
  Administered 2023-11-03 – 2023-11-05 (×5): 500 mg via ORAL
  Filled 2023-11-03 (×5): qty 1

## 2023-11-03 MED ORDER — ARIPIPRAZOLE 5 MG PO TABS
5.0000 mg | ORAL_TABLET | Freq: Every day | ORAL | Status: DC
  Administered 2023-11-03: 5 mg via ORAL
  Filled 2023-11-03 (×2): qty 1

## 2023-11-03 MED ORDER — NICOTINE POLACRILEX 2 MG MT GUM
2.0000 mg | CHEWING_GUM | OROMUCOSAL | Status: DC
  Administered 2023-11-03: 2 mg via ORAL
  Filled 2023-11-03 (×4): qty 1

## 2023-11-03 MED ORDER — BUPRENORPHINE HCL-NALOXONE HCL 8-2 MG SL SUBL
1.0000 | SUBLINGUAL_TABLET | Freq: Two times a day (BID) | SUBLINGUAL | Status: DC
  Administered 2023-11-03 – 2023-11-05 (×3): 1 via SUBLINGUAL
  Filled 2023-11-03 (×4): qty 1

## 2023-11-03 NOTE — ED Provider Notes (Signed)
 [place] EMERGENCY DEPARTMENT AT [facility] Provider Note   CSN: [id number] Arrival date & time: 11/03/23  1505     History  Chief Complaint  Patient presents with   Psychiatric Evaluation    Tanner [name] is a 58 y.o. male with history of type 2 diabetes, opioid use disorder, hypertension, schizophrenia, presents with concern that "people from the police are following me".  He states that "I can walk for miles and they are always cars following me".  He also reports concern for "spirits" in his house and "white faces" that are watching him. He denies hearing any voices. He reports thinking about hurting the people who follow him, but denies any suicidal ideation.  Denies any active plan.   States he has been taking all home medications except for metformin since he ran out of this medication.  HPI     Home Medications Prior to Admission medications   Medication Sig Start Date End Date Taking? Authorizing Provider  ARIPiprazole (ABILIFY) 5 MG tablet Take 5 mg by mouth at bedtime. 11/01/23  Yes [provider]  buprenorphine-naloxone (SUBOXONE) 8-2 mg SUBL SL tablet Place 1 tablet under the tongue 2 (two) times daily. 11/01/23  Yes [provider]      Allergies    Trazodone and nefazodone    Review of Systems   Review of Systems  Psychiatric/Behavioral:  Positive for hallucinations. Negative for suicidal ideas.     Physical Exam Updated Vital Signs BP (!) 165/92 (BP Location: Right Arm)   Pulse 66   Temp 98.3 F (36.8 C) (Oral)   Resp 16   Ht 6\' 1"  (1.854 m)   Wt 73.9 kg   SpO2 100%   BMI 21.49 kg/m  Physical Exam Vitals and nursing note reviewed.  Constitutional:      General: He is not in acute distress.    Appearance: He is well-developed.  HENT:     Head: Normocephalic and atraumatic.  Eyes:     Conjunctiva/sclera: Conjunctivae normal.  Cardiovascular:     Rate and Rhythm: Normal rate and regular rhythm.     Heart sounds:  No murmur heard. Pulmonary:     Effort: Pulmonary effort is normal. No respiratory distress.     Breath sounds: Normal breath sounds.  Abdominal:     Palpations: Abdomen is soft.     Tenderness: There is no abdominal tenderness.  Musculoskeletal:        General: No swelling.     Cervical back: Neck supple.  Skin:    General: Skin is warm and dry.     Capillary Refill: Capillary refill takes less than 2 seconds.  Neurological:     Mental Status: He is alert.     Comments: Alert and oriented to name, place, month.     Psychiatric:        Mood and Affect: Mood normal.        Behavior: Behavior normal.     Comments: Appears suspicious of people walking around.  Answers questions appropriately  No thoughts of grandiosity, no pressured speech     ED Results / Procedures / Treatments   Labs (all labs ordered are listed, but only abnormal results are displayed) Labs Reviewed  COMPREHENSIVE METABOLIC PANEL - Abnormal; Notable for the following components:      Result Value   Sodium 131 (*)    Chloride 97 (*)    Glucose, Bld 357 (*)    BUN 21 (*)  All other components within normal limits  CBC WITH DIFFERENTIAL/PLATELET - Abnormal; Notable for the following components:   Hemoglobin 12.2 (*)    HCT 35.9 (*)    All other components within normal limits  ETHANOL  RAPID URINE DRUG SCREEN, HOSP PERFORMED    EKG None  Radiology No results found.  Procedures Procedures    Medications Ordered in ED Medications  ARIPiprazole (ABILIFY) tablet 5 mg (has no administration in time range)  buprenorphine-naloxone (SUBOXONE) 8-2 mg per SL tablet 1 tablet (has no administration in time range)  metFORMIN (GLUCOPHAGE) tablet 500 mg (has no administration in time range)    ED Course/ Medical Decision Making/ A&P                                 Medical Decision Making Amount and/or Complexity of Data Reviewed Labs: ordered.  Risk Prescription drug  management.     Differential diagnosis includes but is not limited to schizophrenia, paranoia  ED Course:  Patient with stable vital signs upon arrival aside from a slightly elevated blood pressure of 165/92.  Denies pain anywhere.  He is concern for people following him and seeing spirits in his home.  He does report thoughts of trying to hurt these people who are following him.  Denies any suicidal ideation.  He is here voluntarily, no indication for IVC currently. I Ordered, and personally interpreted labs.  The pertinent results include:   CBC without leukocytosis.  Mild anemia with hemoglobin at 12.2 CMP with slight hyponatremia at 131, glucose elevated at 357 Ethanol level undetectable UDS in process Low patient's glucose is elevated.  No other signs of DKA.  Suspect this is secondary to him being off of his metformin for a while.  Patient medically cleared for psychiatric consult. TTS consult placed, awaiting psychiatric recommendations.  Patient's home medications have been ordered.  I did reorder patient's metformin even though this was not included in pharmacy med rec as he states he has not been able to take this medication since he ran out.  Impression: Paranoia Uncontrolled diabetes  Disposition:  Patient placed in psych hold.  Awaiting psychiatry recommendations.   Consultations Obtained: I requested consultation with the psychiatry/TTS, awaiting recommendations                Final Clinical Impression(s) / ED Diagnoses Final diagnoses:  Paranoia (HCC)  Blood glucose elevated    Rx / DC Orders ED Discharge Orders     None         Arabella Merles, Cordelia Poche 11/03/23 1752    Benjiman Core, MD 11/03/23 [phone]

## 2023-11-03 NOTE — BH Assessment (Signed)
 Patient was seen by on-site provider and was recommended for inpatient admission per [facility], PHMNP.
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
## 2023-11-03 NOTE — BH Assessment (Signed)
 Patient was deferred to [facility] for a telepsych assessment. The assigned care coordinator will provide updates regarding the scheduling of the assessment. [facility] coordinator can be reached at [phone] for further information on the timing of the telepsych evaluation.

## 2023-11-03 NOTE — Progress Notes (Signed)
 Patient meets BH inpatient criteria per [facility], PHMNP. Patient has been denied by [facility] due to no appropriate beds available. Patient has been faxed out to the following facilities:   [facility] - Request [address] [place] [place] [phone]--[facility] - Request Sent--[address], [place] [phone]--IHKVQ-QVZDGLOV DunesPending - Request [address], [place] [phone]--NATFT-DDUKGURK [facility] RidgePending - Request Sent--[address] [place], [place] [phone]--YIRSW-NIOEVO Health-Behavioral Health Patient PlacementPending - Request [facility], [place]704-301 [phone]--[facility] - Request [address] [place] [phone]--DPOEU-MPN [facility] HealthPending - Request Sent--[address] [place] [phone]--IWPYK-DXI Luvenia Redden - Request Sent--[address], [phone]--[facility] HealthPending - Request [facility], [place] [phone]--DZHGD-JMEQA [facility] HealthPending - Request [address] [place] [phone]--KGYJE-HUDJS [facility] Adult CampusPending - Request Sent--[address] [place] [phone]--HMCNO-BSJGGE HealthPending - Request Sent--[address] [place] [phone]--[facility] - Request [place] [phone]--[facility] ForestPending - Request Sent--[address] [facility] [address] [place] [phone]--[facility] - Request  [address] [place], [place] [phone]--ZGYFV-CBSWHQPR [place]Pending - Request [address] [place] [phone]--PQZRA-QTMAUQJ [facility] HealthPending - Request [address] [place] [phone]--WIOMB-TDHR [facility] - Request Sent--[address] [place] [phone]--BCWUG-QBVQXIH [facility] Center[facility]ending - Request Sent--[address] [place] [facility] [phone]--[facility] HealthcarePending - Request [address] [place] [phone]--  Damita Dunnings, MSW, LCSW-A  7:49 PM 11/03/2023

## 2023-11-03 NOTE — Consult Note (Signed)
 [facility] Psychiatric Consult Initial  Patient Name: .Keyton Bhat  MRN: [id number]  DOB: June 20, 1966  Consult Order details:  Orders (From admission, onward)     Start     Ordered   11/03/23 1713  CONSULT TO CALL ACT TEAM       Ordering Provider: Arabella Merles, PA-C  Provider:  (Not yet assigned)  Question:  Reason for Consult?  Answer:  Psych conuslt, paranoia   11/03/23 1712             Mode of Visit: In person    Psychiatry Consult Evaluation  Service Date: November 03, 2023 LOS:  LOS: 0 days  Chief Complaint "people from the police are following me"   Primary Psychiatric Diagnoses  Paranoia  2.   Stimulant induced psychotic disorder with delusions 3.  Generalized anxiety disorder  Assessment  Thelonious Kauffmann is a 58 y.o. male admitted: Presented to the ED on 11/03/2023  3:09 PM for paranoia. He carries the psychiatric diagnoses of paranoia, stimulant induced psychotic disorder with delusions, and anxiety and has a past medical history of chronic back pain.   His current presentation of believing that [employer] is following him and trying to harm him is most consistent with paranoia. He meets criteria for inpatient psychiatric admission based on current symptoms.  Current outpatient psychotropic medications include Abilify, Gean Birchwood, Suboxone and historically he has had a positive response to these medications. He was non compliant with medications prior to admission as evidenced by patient report. On initial examination, patient pleasant and cooperative. Please see plan below for detailed recommendations.   Diagnoses:  Active Hospital problems: Principal Problem:   Paranoia [facility]) Active Problems:   Generalized anxiety disorder   Stimulant-induced psychotic disorder with delusions (HCC)    Plan   ## Psychiatric Medication Recommendations:  Continue patient on home medications Start patient on Invega p.o. 6 mg daily, for psychosis Nicorette gum every 2 hours  while awake for nicotine dependence Hydroxyzine 25 mg p.o. for anxiety 3 times daily as needed  ## Medical Decision Making Capacity: Not specifically addressed in this encounter  ## Further Work-up:  -- No further workup needed at this time EKG or UDS -- most recent EKG on 11/03/23 had QtC of 411 -- Pertinent labwork reviewed earlier this admission includes: EKG, CBC, CMP, UDS   ## Disposition:-- We recommend inpatient psychiatric hospitalization when medically cleared. Patient is under voluntary admission status at this time; please IVC if attempts to leave hospital.  ## Behavioral / Environmental: -To minimize splitting of staff, assign one staff person to communicate all information from the team when feasible. or Utilize compassion and acknowledge the patient's experiences while setting clear and realistic expectations for care.    ## Safety and Observation Level:  - Based on my clinical evaluation, I estimate the patient to be at no risk of self harm in the current setting. - At this time, we recommend  routine. This decision is based on my review of the chart including patient's history and current presentation, interview of the patient, mental status examination, and consideration of suicide risk including evaluating suicidal ideation, plan, intent, suicidal or self-harm behaviors, risk factors, and protective factors. This judgment is based on our ability to directly address suicide risk, implement suicide prevention strategies, and develop a safety plan while the patient is in the clinical setting. Please contact our team if there is a concern that risk level has changed.  CSSR Risk Category:C-SSRS RISK CATEGORY: No Risk  Suicide Risk Assessment: Patient has following modifiable risk factors for suicide: medication noncompliance and lack of access to outpatient mental health resources, which we are addressing by recommending inpatient psychiatric admission. Patient has following  non-modifiable or demographic risk factors for suicide: male gender and psychiatric hospitalization Patient has the following protective factors against suicide: Supportive family and Supportive friends  Thank you for this consult request. Recommendations have been communicated to the primary team.  We will recommend inpatient psychiatric admission and continue to follow patient at this time.   Glennis Borger MOTLEY-MANGRUM, PMHNP       History of Present Illness  Relevant Aspects of Hospital ED Course:  Admitted on 11/03/2023 for increased paranoia and medication noncompliance.   Patient Report:  Eriberto Felch, 58 y.o., male patient seen face to face by this provider, consulted with Dr. Enedina Finner; and chart reviewed on 11/03/23.  On evaluation, patient is alert and oriented to person, place, time, and situation. However his thought process is disorganized with circumstantial association. His speech is coherent. His mood is euthymic and affect is congruent. He is calm and cooperative. He does not appear to be in acute distress. He states that he has had better days, he states that he currently lives by himself in an apartment, but has friends staying over.  States he is here in the hospital for safety because he feels like he is going to be harmed, he states that [employer] him around [place] daily and he cannot rest.  Even while talking with this provider patient is constantly staring around the room, watching people.  Patient currently denies SI/HI/AH, endorses visual hallucinations of spirits who move their mouths but do not say anything. There is no objective evidence that the patient is currently responding to internal or external stimuli. He reports using drugs to "get up." He reports using "clear or ice" known as methamphetamines daily via IV use for the past 8 years. He also endorses using marijuana frequently.  UDS positive for amphetamines and THC.  BAL less than 10.  He denies  drinking alcohol. He reports a "dozen different" past substance abuse treatments. He states that he last received substance abuse treatment in [place] about 6 or 7 years ago. He reports poor sleep, and states that he about 2 hours last night. He reports a fair appetite. 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  He denies outpatient psychiatry or counseling services at this time. He denies legal issues. He reports a family psychiatric history of maternal grandfather was an alcoholic.    Psych ROS:  Depression: Endorses Anxiety: Endorses Mania (lifetime and current): Denies Psychosis: (lifetime and current): Currently  Collateral information:  Contacted none at this time, patient is attempting to get his friend John's number from his phone, does not want anyone to contact his family.  Review of Systems  Psychiatric/Behavioral:  Positive for depression, hallucinations and substance abuse.      Psychiatric and Social History  Psychiatric History:  Information collected from patient  Prev Dx/Sx: Paranoia, schizophrenia, anxiety Current Psych Provider: Denies Home Meds (current): See above Previous Med Trials: Yes Therapy: Denies  Prior Psych Hospitalization: Yes Prior Self Harm: Yes Prior Violence: denies  Family Psych History: Yes Family Hx suicide: Denies  Social History:  Developmental Hx: Deferred Educational Hx: Patient graduated high school Occupational Hx: Works at [employer] Hx: Denies Living Situation: Patient has his own apartment Spiritual Hx: Yes Access to weapons/lethal means: Denies  Substance History Alcohol: Denies History of alcohol withdrawal seizures denies History of DT's denies Tobacco: Yes Illicit drugs: Yes Prescription drug abuse: Denies Rehab hx: Yes  Exam Findings  Physical Exam:  Vital Signs:  Temp:  [98.3 F (36.8 C)] 98.3 F (36.8 C) (03/20 1511) Pulse Rate:  [66] 66 (03/20 1511) Resp:  [16] 16 (03/20  1511) BP: (165)/(92) 165/92 (03/20 1511) SpO2:  [100 %] 100 % (03/20 1511) Weight:  [73.9 kg] 73.9 kg (03/20 1513) Blood pressure (!) 165/92, pulse 66, temperature 98.3 F (36.8 C), temperature source Oral, resp. rate 16, height 6\' 1"  (1.854 m), weight 73.9 kg, SpO2 100%. Body mass index is 21.49 kg/m.  Physical Exam Vitals and nursing note reviewed. Exam conducted with a chaperone present.  Psychiatric:        Attention and Perception: Attention normal.        Mood and Affect: Mood is anxious.        Speech: Speech normal.        Behavior: Behavior is cooperative.        Thought Content: Thought content is paranoid.        Cognition and Memory: Cognition is impaired.        Judgment: Judgment is inappropriate.     Mental Status Exam: General Appearance: Casual  Orientation:  Full (Time, Place, and Person)  Memory:  Immediate;   Fair Remote;   Fair  Concentration:  Concentration: Fair and Attention Span: Fair  Recall:  Fair  Attention  Fair  Eye Contact:  Fair  Speech:  Clear and Coherent  Language:  Fair  Volume:  Decreased  Mood: sad  Affect:  Appropriate and Flat  Thought Process:  Coherent  Thought Content:  Delusions and Paranoid Ideation  Suicidal Thoughts:  No  Homicidal Thoughts:  No  Judgement:  Impaired  Insight:  Lacking  Psychomotor Activity:  Normal  Akathisia:  No  Fund of Knowledge:  Fair      Assets:  Manufacturing systems engineer Desire for Improvement Social Support  Cognition:  Impaired,  Moderate  ADL's:  Intact  AIMS (if indicated):        Other History   These have been pulled in through the EMR, reviewed, and updated if appropriate.  Family History:  The patient's family history includes Dementia in his father; Diabetes Mellitus II in his brother.  Medical History: Past Medical History:  Diagnosis Date  . Cellulitis of right foot 05/17/2019  . Diabetes mellitus without complication (HCC)   . Drug abuse (HCC)   . Essential hypertension  09/20/2017   Formatting of this note might be different from the original. Stable with current therapy; continue  Last Assessment & Plan:  Formatting of this note might be different from the original.  . Hepatitis C antibody positive in blood 10/05/2017   Formatting of this note might be different from the original. 10/05/17: ab +, RNA neg on 2/12 labs. Likely past infection that is resolved. F/u prn.  . History of vitamin D deficiency 09/20/2017   Formatting of this note might be different from the original. 09/20/17: per pt; labs 1 wk; supplement prn.  Marland Kitchen Opioid dependence, uncomplicated (HCC) 02/24/2018   Formatting of this note might be different from the original. Self-medicating with buprenorphine Formatting of this note might be different from the original. Self-medicating with buprenorphine  . Periodontal disease 10/11/2017   Formatting of this note might be different from the original. 10/11/17: refer to dental  . Schizophrenia (HCC)   .  Tobacco abuse   . Type 2 diabetes mellitus with hyperglycemia (HCC) 01/17/2022    Surgical History: Past Surgical History:  Procedure Laterality Date  . SKIN GRAFT       Medications:   Current Facility-Administered Medications:  .  ARIPiprazole (ABILIFY) tablet 5 mg, 5 mg, Oral, QHS, Franaszek, Amanda, PA-C .  buprenorphine-naloxone (SUBOXONE) 8-2 mg per SL tablet 1 tablet, 1 tablet, Sublingual, BID, Franaszek, Amanda, PA-C .  metFORMIN (GLUCOPHAGE) tablet 500 mg, 500 mg, Oral, BID WC, Arabella Merles, PA-C, 500 mg at 11/03/23 1807  Current Outpatient Medications:  .  ARIPiprazole (ABILIFY) 5 MG tablet, Take 5 mg by mouth at bedtime., Disp: , Rfl:  .  buprenorphine-naloxone (SUBOXONE) 8-2 mg SUBL SL tablet, Place 1 tablet under the tongue 2 (two) times daily., Disp: , Rfl:   Allergies: Allergies  Allergen Reactions  . Trazodone And Nefazodone Other (See Comments)    Dry mouth    Tawona Filsinger MOTLEY-MANGRUM, PMHNP

## 2023-11-04 DIAGNOSIS — F200 Paranoid schizophrenia: Secondary | ICD-10-CM

## 2023-11-04 LAB — RESP PANEL BY RT-PCR (RSV, FLU A&B, COVID)  RVPGX2
Influenza A by PCR: NEGATIVE
Influenza B by PCR: NEGATIVE
Resp Syncytial Virus by PCR: NEGATIVE
SARS Coronavirus 2 by RT PCR: NEGATIVE

## 2023-11-04 LAB — CBG MONITORING, ED: Glucose-Capillary: 171 mg/dL — ABNORMAL HIGH (ref 70–99)

## 2023-11-04 NOTE — ED Notes (Signed)
Dinner Tray Given

## 2023-11-04 NOTE — Consult Note (Signed)
 [name] Psychiatric Consult Initial  Patient Name: .Tanner [name]  MRN: [id number]  DOB: 28-Jul-1966  Consult Order details:  Orders (From admission, onward)     Start     Ordered   11/03/23 1713  CONSULT TO CALL ACT TEAM       Ordering Provider: Arabella Merles, PA-C  Provider:  (Not yet assigned)  Question:  Reason for Consult?  Answer:  Psych conuslt, paranoia   11/03/23 1712             Mode of Visit: In person    Psychiatry Consult Evaluation  Service Date: November 04, 2023 LOS:  LOS: 0 days  Chief Complaint "people from the police are following me"   Primary Psychiatric Diagnoses  Paranoia  2.   Stimulant induced psychotic disorder with delusions 3.   Generalized anxiety disorder  Assessment  Tanner [name] is a 58 y.o. male admitted: Presented to the ED on 11/03/2023  3:09 PM for paranoia. He carries the psychiatric diagnoses of paranoia, stimulant induced psychotic disorder with delusions, and anxiety and has a past medical history of chronic back pain.    On evaluation today, the patient is laying in his bed, watching television, and appears to be in no acute distress. He is calm and cooperative during this assessment. His appearance is appropriate for environment. His eye contact is good.  Speech is clear and coherent, normal pace and normal volume. He is alert and oriented x3 to person, place, and situation. He reports his mood is "good".  Affect is congruent with mood.  Thought process is coherent.  He denies auditory and visual hallucinations.  No indication that he is responding to internal stimuli during this assessment.  No delusions elicited during this assessment. He denies suicidal ideations. He denies homicidal ideations. Appetite and sleep are fair.  Patient states that he feels safe in the hospital and he does not feel like [employer] is after him. He states he gets so tired of running and trying to find safe places. Please see plan below for detailed  recommendations.   Diagnoses:  Active Hospital problems: Principal Problem:   Paranoia Tanner [name]) Active Problems:   Generalized anxiety disorder   Stimulant-induced psychotic disorder with delusions (HCC)    Plan   ## Psychiatric Medication Recommendations:  Continue patient on home medications Continue Invega p.o. 6 mg daily, for psychosis (patient wants to get back on Invega Inj) Nicorette gum every 2 hours while awake for nicotine dependence Hydroxyzine 25 mg p.o. for anxiety 3 times daily as needed  ## Medical Decision Making Capacity: Not specifically addressed in this encounter  ## Further Work-up:  -- No further workup needed at this time EKG or UDS -- most recent EKG on 11/03/23 had QtC of 411 -- Pertinent labwork reviewed earlier this admission includes: EKG, CBC, CMP, UDS   ## Disposition:-- We recommend inpatient psychiatric hospitalization when medically cleared. Patient is under voluntary admission status at this time; please IVC if attempts to leave hospital.  ## Behavioral / Environmental: -To minimize splitting of staff, assign one staff person to communicate all information from the team when feasible. or Utilize compassion and acknowledge the patient's experiences while setting clear and realistic expectations for care.    ## Safety and Observation Level:  - Based on my clinical evaluation, I estimate the patient to be at no risk of self harm in the current setting. - At this time, we recommend  routine. This decision is based on my review of  the chart including patient's history and current presentation, interview of the patient, mental status examination, and consideration of suicide risk including evaluating suicidal ideation, plan, intent, suicidal or self-harm behaviors, risk factors, and protective factors. This judgment is based on our ability to directly address suicide risk, implement suicide prevention strategies, and develop a safety plan while the patient  is in the clinical setting. Please contact our team if there is a concern that risk level has changed.  CSSR Risk Category:C-SSRS RISK CATEGORY: No Risk  Suicide Risk Assessment: Patient has following modifiable risk factors for suicide: medication noncompliance and lack of access to outpatient mental health resources, which we are addressing by recommending inpatient psychiatric admission. Patient has following non-modifiable or demographic risk factors for suicide: male gender and psychiatric hospitalization Patient has the following protective factors against suicide: Supportive family and Supportive friends  Thank you for this consult request. Recommendations have been communicated to the primary team.  We will recommend inpatient psychiatric admission and continue to follow patient at this time.   Mardella Nuckles MOTLEY-MANGRUM, PMHNP       History of Present Illness  Relevant Aspects of Hospital ED Course:  Admitted on 11/03/2023 for increased paranoia and medication noncompliance.   Patient Report:  On evaluation today, the patient is laying in his bed, watching television, and appears to be in no acute distress. He is calm and cooperative during this assessment. His appearance is appropriate for environment. His eye contact is good.  Speech is clear and coherent, normal pace and normal volume. He is alert and oriented x3 to person, place, and situation. He reports his mood is "good".  Affect is congruent with mood.  Thought process is coherent.  He denies auditory and visual hallucinations.  No indication that he is responding to internal stimuli during this assessment.  No delusions elicited during this assessment. He denies suicidal ideations. He denies homicidal ideations. Appetite and sleep are fair.  Patient states that he feels safe in the hospital and he does not feel like [employer] is after him. He states he gets so tired of running and trying to find safe places.   Psych ROS:  Depression:  Endorses Anxiety: Endorses Mania (lifetime and current): Denies Psychosis: (lifetime and current): Currently  Collateral information:  Contacted none at this time   Review of Systems  Psychiatric/Behavioral:  Positive for depression, hallucinations and substance abuse.      Psychiatric and Social History  Psychiatric History:  Information collected from patient  Prev Dx/Sx: Paranoia, schizophrenia, anxiety Current Psych Provider: Denies Home Meds (current): See above Previous Med Trials: Yes Therapy: Denies  Prior Psych Hospitalization: Yes Prior Self Harm: Yes Prior Violence: denies  Family Psych History: Yes Family Hx suicide: Denies  Social History:  Developmental Hx: Deferred Educational Hx: Patient graduated high school Occupational Hx: Works at [employer] Hx: Denies Living Situation: Patient has his own apartment Spiritual Hx: Yes Access to weapons/lethal means: Denies  Substance History Alcohol: Denies History of alcohol withdrawal seizures denies History of DT's denies Tobacco: Yes Illicit drugs: Yes Prescription drug abuse: Denies Rehab hx: Yes  Exam Findings  Physical Exam:  Vital Signs:  Temp:  [98.2 F (36.8 C)-98.5 F (36.9 C)] 98.4 F (36.9 C) (03/21 1700) Pulse Rate:  [58-88] 88 (03/21 1700) Resp:  [16-18] 16 (03/21 1700) BP: (140-170)/(85-87) 155/87 (03/21 1700) SpO2:  [99 %-100 %] 99 % (03/21 1700) Blood pressure (!) 155/87, pulse 88, temperature 98.4 F (36.9 C), temperature source Oral, resp. rate  16, height 6\' 1"  (1.854 m), weight 73.9 kg, SpO2 99%. Body mass index is 21.49 kg/m.  Physical Exam Vitals and nursing note reviewed. Exam conducted with a chaperone present.  Psychiatric:        Attention and Perception: Attention normal.        Mood and Affect: Mood is anxious.        Speech: Speech normal.        Behavior: Behavior is cooperative.        Thought Content: Thought content is paranoid.        Cognition and Memory:  Cognition is impaired.        Judgment: Judgment is inappropriate.    Mental Status Exam: General Appearance: Casual  Orientation:  Full (Time, Place, and Person)  Memory:  Immediate;   Fair Remote;   Fair  Concentration:  Concentration: Fair and Attention Span: Fair  Recall:  Fair  Attention  Fair  Eye Contact:  Fair  Speech:  Clear and Coherent  Language:  Fair  Volume:  Decreased  Mood: sad  Affect:  Appropriate and Flat  Thought Process:  Coherent  Thought Content:  Delusions and Paranoid Ideation  Suicidal Thoughts:  No  Homicidal Thoughts:  No  Judgement:  Impaired  Insight:  Lacking  Psychomotor Activity:  Normal  Akathisia:  No  Fund of Knowledge:  Fair      Assets:  Manufacturing systems engineer Desire for Improvement Social Support  Cognition:  Impaired,  Moderate  ADL's:  Intact  AIMS (if indicated):        Other History   These have been pulled in through the EMR, reviewed, and updated if appropriate.  Family History:  The patient's family history includes Dementia in his father; Diabetes Mellitus II in his brother.  Medical History: Past Medical History:  Diagnosis Date   Cellulitis of right foot 05/17/2019   Diabetes mellitus without complication (HCC)    Drug abuse (HCC)    Essential hypertension 09/20/2017   Formatting of this note might be different from the original. Stable with current therapy; continue  Last Assessment & Plan:  Formatting of this note might be different from the original.   Hepatitis C antibody positive in blood 10/05/2017   Formatting of this note might be different from the original. 10/05/17: ab +, RNA neg on 2/12 labs. Likely past infection that is resolved. F/u prn.   History of vitamin D deficiency 09/20/2017   Formatting of this note might be different from the original. 09/20/17: per pt; labs 1 wk; supplement prn.   Opioid dependence, uncomplicated (HCC) 02/24/2018   Formatting of this note might be different from the original.  Self-medicating with buprenorphine Formatting of this note might be different from the original. Self-medicating with buprenorphine   Periodontal disease 10/11/2017   Formatting of this note might be different from the original. 10/11/17: refer to dental   Schizophrenia (HCC)    Tobacco abuse    Type 2 diabetes mellitus with hyperglycemia (HCC) 01/17/2022    Surgical History: Past Surgical History:  Procedure Laterality Date   SKIN GRAFT       Medications:   Current Facility-Administered Medications:    ARIPiprazole (ABILIFY) tablet 5 mg, 5 mg, Oral, QHS, Franaszek, Amanda, PA-C, 5 mg at 11/03/23 2233   buprenorphine-naloxone (SUBOXONE) 8-2 mg per SL tablet 1 tablet, 1 tablet, Sublingual, BID, Arabella Merles, PA-C, 1 tablet at 11/04/23 0956   metFORMIN (GLUCOPHAGE) tablet 500 mg, 500 mg, Oral, BID WC,  Arabella Merles, PA-C, 500 mg at 11/04/23 1647   nicotine polacrilex (NICORETTE) gum 2 mg, 2 mg, Oral, Q2H while awake, Motley-Mangrum, Sadiya Durand A, PMHNP, 2 mg at 11/03/23 2233   paliperidone (INVEGA) 24 hr tablet 6 mg, 6 mg, Oral, Daily, Motley-Mangrum, Maelani Yarbro A, PMHNP, 6 mg at 11/04/23 6213  Current Outpatient Medications:    ARIPiprazole (ABILIFY) 5 MG tablet, Take 5 mg by mouth at bedtime., Disp: , Rfl:    buprenorphine-naloxone (SUBOXONE) 8-2 mg SUBL SL tablet, Place 1 tablet under the tongue 2 (two) times daily., Disp: , Rfl:   Allergies: Allergies  Allergen Reactions   Trazodone And Nefazodone Other (See Comments)    Dry mouth    Kaceton Vieau MOTLEY-MANGRUM, PMHNP

## 2023-11-04 NOTE — Progress Notes (Signed)
 LCSW Progress Note:   MRN: [id number]  Tanner [name]  11/04/2023 4:32 PM  Patient meets BH inpatient criteria per [facility], PHMNP. Patient has been denied by [facility] due to no appropriate beds available. Patient has been re-faxed out to the following facilities:    Destination  Service Provider   Address Phone Fax Patient Preferred  [facility]   [address] [place] [place] [address] [phone] --  [facility]   [address], [place] [address] [phone] --  [facility]   [address] [place] [address] [phone] --  [facility] [place]   [address] [place], [place] [address] [phone] --  [facility] Patient Placement   [facility] [place] [phone] --  [facility]   [address] [place] [address] [phone] --  [facility]   [address], [place] [address] [phone] --  [facility]   [address] [place], [place] [phone] --  [facility]   [address] [place] [address] [phone] --  [facility]   [address], [place] [address] [phone] --  [facility]   [address] [place] [address] [phone] --  [facility]   [address] [place] [address] [phone] --  [facility] [address]   [place] [address] [phone] --  [facility] Regino Bellow [place] [address] [phone] --  [facility]   [address] [place], [place] [address] [phone] --  [facility]   [address] [place], [place] [address] [phone] --  [facility]   [address] [place] [address] [phone] --  [facility]   [address] [place] [address] [phone] --  [facility]   [address], [place] [address] [phone] --  [facility]   [address] [place] [address] [phone] --

## 2023-11-04 NOTE — Progress Notes (Signed)
 CSW spoke with the Intake RN at [facility] who has requested the COVID test. CSW communicated this additional need to the ED staff and is awaiting for the test to be administrated.    Damita Dunnings, MSW, LCSW-A  9:02 PM 11/04/2023

## 2023-11-05 DIAGNOSIS — F22 Delusional disorders: Secondary | ICD-10-CM | POA: Diagnosis not present

## 2023-11-05 DIAGNOSIS — R001 Bradycardia, unspecified: Secondary | ICD-10-CM | POA: Diagnosis not present

## 2023-11-05 NOTE — ED Provider Notes (Signed)
 Emergency Medicine Observation Re-evaluation Note  Tanner [name] is a 58 y.o. male, seen on rounds today.  Pt initially presented to the ED for complaints of Psychiatric Evaluation Currently, the patient is  awaiting BH placement. No new c/o this AM.   Physical Exam  BP (!) 154/81 (BP Location: Right Arm)   Pulse 67   Temp 98.4 F (36.9 C) (Oral)   Resp 16   Ht 1.854 m (6\' 1" )   Wt 73.9 kg   SpO2 98%   BMI 21.49 kg/m  Physical Exam General: calm, nad.  Cardiac: regular rate. Lungs: breathing comfortably. Psych: calm.   ED Course / MDM    I have reviewed the labs performed to date as well as medications administered while in observation.  Recent changes in the last 24 hours include ED obs, reassessment.   Plan  BH disposition per [facility] team.     Cathren Laine, MD 11/05/23 [phone]

## 2023-11-06 ENCOUNTER — Other Ambulatory Visit: Payer: Self-pay

## 2023-11-06 ENCOUNTER — Other Ambulatory Visit (HOSPITAL_COMMUNITY)
Admission: EM | Admit: 2023-11-06 | Discharge: 2023-11-11 | Disposition: A | Discharge status: Home / Self Care | Attending: Psychiatry | Admitting: Psychiatry

## 2023-11-06 DIAGNOSIS — F1510 Other stimulant abuse, uncomplicated: Secondary | ICD-10-CM | POA: Diagnosis not present

## 2023-11-06 DIAGNOSIS — F22 Delusional disorders: Secondary | ICD-10-CM | POA: Diagnosis present

## 2023-11-06 DIAGNOSIS — I4519 Other right bundle-branch block: Secondary | ICD-10-CM | POA: Insufficient documentation

## 2023-11-06 MED ORDER — ARIPIPRAZOLE 5 MG PO TABS: 5.0000 mg | ORAL_TABLET | Freq: Every day | ORAL | Status: DC

## 2023-11-06 MED ORDER — HYDROXYZINE HCL 25 MG PO TABS
25.0000 mg | ORAL_TABLET | Freq: Three times a day (TID) | ORAL | Status: DC | PRN
  Administered 2023-11-06 – 2023-11-10 (×4): 25 mg via ORAL
  Filled 2023-11-06 (×4): qty 1

## 2023-11-06 MED ORDER — LORAZEPAM 2 MG/ML IJ SOLN: 2.0000 mg | Freq: Three times a day (TID) | INTRAMUSCULAR | Status: DC | PRN

## 2023-11-06 MED ORDER — TRAZODONE HCL 50 MG PO TABS
50.0000 mg | ORAL_TABLET | Freq: Every evening | ORAL | Status: DC | PRN
  Administered 2023-11-06 – 2023-11-10 (×6): 50 mg via ORAL
  Filled 2023-11-06 (×6): qty 1

## 2023-11-06 MED ORDER — BUPRENORPHINE HCL-NALOXONE HCL 8-2 MG SL SUBL
1.0000 | SUBLINGUAL_TABLET | Freq: Two times a day (BID) | SUBLINGUAL | Status: DC
  Administered 2023-11-06 – 2023-11-11 (×12): 1 via SUBLINGUAL
  Filled 2023-11-06 (×12): qty 1

## 2023-11-06 MED ORDER — PALIPERIDONE ER 3 MG PO TB24
3.0000 mg | ORAL_TABLET | Freq: Once | ORAL | Status: AC
Start: 2023-11-06 — End: 2023-11-06
  Administered 2023-11-06: 3 mg via ORAL
  Filled 2023-11-06: qty 1

## 2023-11-06 MED ORDER — DIPHENHYDRAMINE HCL 50 MG/ML IJ SOLN: 50.0000 mg | Freq: Three times a day (TID) | INTRAMUSCULAR | Status: DC | PRN

## 2023-11-06 MED ORDER — HALOPERIDOL 5 MG PO TABS
5.0000 mg | ORAL_TABLET | Freq: Three times a day (TID) | ORAL | Status: DC | PRN
  Administered 2023-11-09: 5 mg via ORAL
  Filled 2023-11-06: qty 1

## 2023-11-06 MED ORDER — PALIPERIDONE ER 6 MG PO TB24
6.0000 mg | ORAL_TABLET | Freq: Once | ORAL | Status: AC
  Administered 2023-11-07: 6 mg via ORAL
  Filled 2023-11-06: qty 1

## 2023-11-06 MED ORDER — DIPHENHYDRAMINE HCL 50 MG PO CAPS
50.0000 mg | ORAL_CAPSULE | Freq: Three times a day (TID) | ORAL | Status: DC | PRN
  Administered 2023-11-09 – 2023-11-10 (×2): 50 mg via ORAL
  Filled 2023-11-06 (×2): qty 1

## 2023-11-06 MED ORDER — MAGNESIUM HYDROXIDE 400 MG/5ML PO SUSP
30.0000 mL | Freq: Every day | ORAL | Status: DC | PRN
  Filled 2023-11-06: qty 30

## 2023-11-06 MED ORDER — ALUM & MAG HYDROXIDE-SIMETH 200-200-20 MG/5ML PO SUSP: 30.0000 mL | ORAL | Status: DC | PRN

## 2023-11-06 MED ORDER — ACETAMINOPHEN 325 MG PO TABS: 650.0000 mg | ORAL_TABLET | Freq: Four times a day (QID) | ORAL | Status: DC | PRN

## 2023-11-06 MED ORDER — HALOPERIDOL LACTATE 5 MG/ML IJ SOLN: 10.0000 mg | Freq: Three times a day (TID) | INTRAMUSCULAR | Status: DC | PRN

## 2023-11-06 MED ORDER — HALOPERIDOL LACTATE 5 MG/ML IJ SOLN: 5.0000 mg | Freq: Three times a day (TID) | INTRAMUSCULAR | Status: DC | PRN

## 2023-11-06 NOTE — ED Notes (Signed)
 Patient resting with eyes closed in no apparent acute distress. Respirations even and unlabored. Environment secured. Safety checks in place according to facility policy.

## 2023-11-06 NOTE — ED Notes (Signed)
 Patient alert & oriented x4. Denies intent to harm self or others when asked. Denies A/VH. Patient denies any physical complaints when asked. No acute distress noted. Scheduled medications administered with no complications. Support and encouragement provided. Routine safety checks conducted per facility protocol. Encouraged patient to notify staff if any thoughts of harm towards self or others arise. Patient verbalizes understanding and agreement.

## 2023-11-06 NOTE — ED Notes (Signed)
 Patient is sleeping. Respirations equal and unlabored, skin warm and dry. No change in assessment or acuity. Routine safety checks conducted according to facility protocol. Will continue to monitor for safety.

## 2023-11-06 NOTE — ED Provider Notes (Signed)
 Behavioral Health Progress Note  Date and Time: 11/06/2023 1:45 PM Name: Tanner [name] MRN:  [id number]  Subjective:   The patient is a 58 year old male with a history of methamphetamine and marijuana abuse.  Over the past year he has been admitted to [facility] on 2 occasions, both of which appear to be related to stimulant induced psychosis.  At the last hospitalization (in November 2024), the patient received Gean [place].  On the present occasion, 11/03/2023, the patient presented to [facility] long ED requesting an [place] shot and was noted to be experiencing psychotic symptoms.  He was transferred to the facility based crisis for further treatment.  On my interview today, the patient exhibits a fairly linear and logical thought process.  He is not responding to internal stimuli and denies experiencing any hallucinations.  He does state "people are after me" when questioned about paranoia.  He again states "I came in to get my shot".  He feels positively about the [place] injection.  When asked where he has been living, the patient reports that for the last 5 years "I have been living in a 20 foot x 30 foot cinderblock structure owned by the [employer]  The patient reports having no social support.  He endorses a desire for sobriety when this is brought up.  However, he does not appear particularly enthusiastic about this.  He says that he will consider going to [facility].  The patient reports regularly taking Suboxone but denies taking any other medications frequently.  He denies having any medical issues.  He denies the use of alcohol, opioids, or other substances.  He reports that he uses "some meth" and marijuana.  Diagnosis:  Final diagnoses:  Methamphetamine abuse (HCC)    Total Time spent with patient: 15 minutes  Past Psychiatric History: See H&P and above Past Medical History: See H&P and above Family History: See H&P and above Family  Psychiatric  History: See H&P and above Social History: See H&P and above  Additional Social History:  none  Sleep: fair  Appetite:  fair  Current Medications:  Current Facility-Administered Medications  Medication Dose Route Frequency Provider Last Rate Last Admin   acetaminophen (TYLENOL) tablet 650 mg  650 mg Oral Q6H PRN Dixon, Rashaun M, NP       alum & mag hydroxide-simeth (MAALOX/MYLANTA) 200-200-20 MG/5ML suspension 30 mL  30 mL Oral Q4H PRN Durwin Nora, Rashaun M, NP       buprenorphine-naloxone (SUBOXONE) 8-2 mg per SL tablet 1 tablet  1 tablet Sublingual BID Jearld Lesch, NP   1 tablet at 11/06/23 1003   haloperidol (HALDOL) tablet 5 mg  5 mg Oral TID PRN Jearld Lesch, NP       And   diphenhydrAMINE (BENADRYL) capsule 50 mg  50 mg Oral TID PRN Jearld Lesch, NP       haloperidol lactate (HALDOL) injection 5 mg  5 mg Intramuscular TID PRN Jearld Lesch, NP       And   diphenhydrAMINE (BENADRYL) injection 50 mg  50 mg Intramuscular TID PRN Jearld Lesch, NP       And   LORazepam (ATIVAN) injection 2 mg  2 mg Intramuscular TID PRN Jearld Lesch, NP       haloperidol lactate (HALDOL) injection 10 mg  10 mg Intramuscular TID PRN Jearld Lesch, NP       And   diphenhydrAMINE (BENADRYL) injection 50 mg  50 mg  Intramuscular TID PRN Jearld Lesch, NP       And   LORazepam (ATIVAN) injection 2 mg  2 mg Intramuscular TID PRN Jearld Lesch, NP       hydrOXYzine (ATARAX) tablet 25 mg  25 mg Oral TID PRN Jearld Lesch, NP   25 mg at 11/06/23 0329   magnesium hydroxide (MILK OF MAGNESIA) suspension 30 mL  30 mL Oral Daily PRN Jearld Lesch, NP       Melene Muller ON 11/07/2023] paliperidone (INVEGA) 24 hr tablet 6 mg  6 mg Oral Once Carlyn Reichert, MD       traZODone (DESYREL) tablet 50 mg  50 mg Oral QHS PRN Jearld Lesch, NP   50 mg at 11/06/23 0981   Current Outpatient Medications  Medication Sig Dispense Refill   ARIPiprazole (ABILIFY) 5 MG tablet Take 5  mg by mouth at bedtime.     buprenorphine-naloxone (SUBOXONE) 8-2 mg SUBL SL tablet Place 1 tablet under the tongue 2 (two) times daily.      Labs  Lab Results:  Admission on 11/03/2023, Discharged on 11/06/2023  Component Date Value Ref Range Status   Sodium 11/03/2023 131 (L)  135 - 145 mmol/L Final   Potassium 11/03/2023 4.0  3.5 - 5.1 mmol/L Final   Chloride 11/03/2023 97 (L)  98 - 111 mmol/L Final   CO2 11/03/2023 26  22 - 32 mmol/L Final   Glucose, Bld 11/03/2023 357 (H)  70 - 99 mg/dL Final   Glucose reference range applies only to samples taken after fasting for at least 8 hours.   BUN 11/03/2023 21 (H)  6 - 20 mg/dL Final   Creatinine, Ser 11/03/2023 0.78  0.61 - 1.24 mg/dL Final   Calcium 19/14/7829 9.1  8.9 - 10.3 mg/dL Final   Total Protein 56/21/3086 7.0  6.5 - 8.1 g/dL Final   Albumin 57/84/6962 4.0  3.5 - 5.0 g/dL Final   AST 95/28/4132 31  15 - 41 U/L Final   ALT 11/03/2023 33  0 - 44 U/L Final   Alkaline Phosphatase 11/03/2023 98  38 - 126 U/L Final   Total Bilirubin 11/03/2023 0.5  0.0 - 1.2 mg/dL Final   GFR, Estimated 11/03/2023 >60  >60 mL/min Final   Comment: (NOTE) Calculated using the CKD-EPI Creatinine Equation (2021)    Anion gap 11/03/2023 8  5 - 15 Final   Performed at [facility], [address], [place], [place] [address]   Alcohol, Ethyl (B) 11/03/2023 <10  <10 mg/dL Final   Comment: (NOTE) Lowest detectable limit for serum alcohol is 10 mg/dL.  For medical purposes only. Performed at [facility], [address] [place], [place] [address]    Opiates 11/03/2023 NONE DETECTED  NONE DETECTED Final   Cocaine 11/03/2023 NONE DETECTED  NONE DETECTED Final   Benzodiazepines 11/03/2023 NONE DETECTED  NONE DETECTED Final   Amphetamines 11/03/2023 POSITIVE (A)  NONE DETECTED Final   Comment: (NOTE) Trazodone is metabolized in vivo to several metabolites, including pharmacologically active m-CPP, which is excreted  in the urine. Immunoassay screens for amphetamines and MDMA have potential cross-reactivity with these compounds and may provide false positive  results.     Tetrahydrocannabinol 11/03/2023 POSITIVE (A)  NONE DETECTED Final   Barbiturates 11/03/2023 NONE DETECTED  NONE DETECTED Final   Comment: (NOTE) DRUG SCREEN FOR MEDICAL PURPOSES ONLY.  IF CONFIRMATION IS NEEDED FOR ANY PURPOSE, NOTIFY LAB WITHIN 5 DAYS.  LOWEST  DETECTABLE LIMITS FOR URINE DRUG SCREEN Drug Class                     Cutoff (ng/mL) Amphetamine and metabolites    1000 Barbiturate and metabolites    200 Benzodiazepine                 200 Opiates and metabolites        300 Cocaine and metabolites        300 THC                            50 Performed at [facility], [address] [place], [place] [address]    WBC 11/03/2023 4.6  4.0 - 10.5 K/uL Final   RBC 11/03/2023 4.37  4.22 - 5.81 MIL/uL Final   Hemoglobin 11/03/2023 12.2 (L)  13.0 - 17.0 g/dL Final   HCT 27/10/5007 35.9 (L)  39.0 - 52.0 % Final   MCV 11/03/2023 82.2  80.0 - 100.0 fL Final   MCH 11/03/2023 27.9  26.0 - 34.0 pg Final   MCHC 11/03/2023 34.0  30.0 - 36.0 g/dL Final   RDW 38/18/2993 12.9  11.5 - 15.5 % Final   Platelets 11/03/2023 288  150 - 400 K/uL Final   nRBC 11/03/2023 0.0  0.0 - 0.2 % Final   Neutrophils Relative % 11/03/2023 66  % Final   Neutro Abs 11/03/2023 3.0  1.7 - 7.7 K/uL Final   Lymphocytes Relative 11/03/2023 22  % Final   Lymphs Abs 11/03/2023 1.0  0.7 - 4.0 K/uL Final   Monocytes Relative 11/03/2023 9  % Final   Monocytes Absolute 11/03/2023 0.4  0.1 - 1.0 K/uL Final   Eosinophils Relative 11/03/2023 2  % Final   Eosinophils Absolute 11/03/2023 0.1  0.0 - 0.5 K/uL Final   Basophils Relative 11/03/2023 1  % Final   Basophils Absolute 11/03/2023 0.0  0.0 - 0.1 K/uL Final   Immature Granulocytes 11/03/2023 0  % Final   Abs Immature Granulocytes 11/03/2023 0.01  0.00 - 0.07 K/uL Final   Performed at  [facility], [address] [place], [place] [address]   Glucose-Capillary 11/03/2023 180 (H)  70 - 99 mg/dL Final   Glucose reference range applies only to samples taken after fasting for at least 8 hours.   SARS Coronavirus 2 by RT PCR 11/04/2023 NEGATIVE  NEGATIVE Final   Comment: (NOTE) SARS-CoV-2 target nucleic acids are NOT DETECTED.  The SARS-CoV-2 RNA is generally detectable in upper respiratory specimens during the acute phase of infection. The lowest concentration of SARS-CoV-2 viral copies this assay can detect is 138 copies/mL. A negative result does not preclude SARS-Cov-2 infection and should not be used as the sole basis for treatment or other patient management decisions. A negative result may occur with  improper specimen collection/handling, submission of specimen other than nasopharyngeal swab, presence of viral mutation(s) within the areas targeted by this assay, and inadequate number of viral copies(<138 copies/mL). A negative result must be combined with clinical observations, patient history, and epidemiological information. The expected result is Negative.  Fact Sheet for Patients:  BloggerCourse.com  Fact Sheet for Healthcare Providers:  SeriousBroker.it  This test is no                          t yet approved or cleared by the [place] FDA  and  has been authorized for detection and/or diagnosis of SARS-CoV-2 by FDA under an Emergency Use Authorization (EUA). This EUA will remain  in effect (meaning this test can be used) for the duration of the COVID-19 declaration under Section 564(b)(1) of the Act, 21 U.S.C.section 360bbb-3(b)(1), unless the authorization is terminated  or revoked sooner.       Influenza A by PCR 11/04/2023 NEGATIVE  NEGATIVE Final   Influenza B by PCR 11/04/2023 NEGATIVE  NEGATIVE Final   Comment: (NOTE) The Xpert Xpress SARS-CoV-2/FLU/RSV plus assay is  intended as an aid in the diagnosis of influenza from Nasopharyngeal swab specimens and should not be used as a sole basis for treatment. Nasal washings and aspirates are unacceptable for Xpert Xpress SARS-CoV-2/FLU/RSV testing.  Fact Sheet for Patients: BloggerCourse.com  Fact Sheet for Healthcare Providers: SeriousBroker.it  This test is not yet approved or cleared by the [place] FDA and has been authorized for detection and/or diagnosis of SARS-CoV-2 by FDA under an Emergency Use Authorization (EUA). This EUA will remain in effect (meaning this test can be used) for the duration of the COVID-19 declaration under Section 564(b)(1) of the Act, 21 U.S.C. section 360bbb-3(b)(1), unless the authorization is terminated or revoked.     Resp Syncytial Virus by PCR 11/04/2023 NEGATIVE  NEGATIVE Final   Comment: (NOTE) Fact Sheet for Patients: BloggerCourse.com  Fact Sheet for Healthcare Providers: SeriousBroker.it  This test is not yet approved or cleared by the [place] FDA and has been authorized for detection and/or diagnosis of SARS-CoV-2 by FDA under an Emergency Use Authorization (EUA). This EUA will remain in effect (meaning this test can be used) for the duration of the COVID-19 declaration under Section 564(b)(1) of the Act, 21 U.S.C. section 360bbb-3(b)(1), unless the authorization is terminated or revoked.  Performed at [facility], [address], [place], [place] [address]    Glucose-Capillary 11/04/2023 171 (H)  70 - 99 mg/dL Final   Glucose reference range applies only to samples taken after fasting for at least 8 hours.  Admission on 06/28/2023, Discharged on 06/28/2023  Component Date Value Ref Range Status   Glucose-Capillary 06/28/2023 81  70 - 99 mg/dL Final   Glucose reference range applies only to samples taken after fasting for  at least 8 hours.   Comment 1 06/28/2023 Notify RN   Final   WBC 06/28/2023 9.5  4.0 - 10.5 K/uL Final   RBC 06/28/2023 4.29  4.22 - 5.81 MIL/uL Final   Hemoglobin 06/28/2023 12.5 (L)  13.0 - 17.0 g/dL Final   HCT 29/51/8841 36.3 (L)  39.0 - 52.0 % Final   MCV 06/28/2023 84.6  80.0 - 100.0 fL Final   MCH 06/28/2023 29.1  26.0 - 34.0 pg Final   MCHC 06/28/2023 34.4  30.0 - 36.0 g/dL Final   RDW 66/01/3015 13.0  11.5 - 15.5 % Final   Platelets 06/28/2023 374  150 - 400 K/uL Final   nRBC 06/28/2023 0.0  0.0 - 0.2 % Final   Neutrophils Relative % 06/28/2023 70  % Final   Neutro Abs 06/28/2023 6.8  1.7 - 7.7 K/uL Final   Lymphocytes Relative 06/28/2023 20  % Final   Lymphs Abs 06/28/2023 1.9  0.7 - 4.0 K/uL Final   Monocytes Relative 06/28/2023 7  % Final   Monocytes Absolute 06/28/2023 0.6  0.1 - 1.0 K/uL Final   Eosinophils Relative 06/28/2023 1  % Final   Eosinophils Absolute 06/28/2023 0.1  0.0 -  0.5 K/uL Final   Basophils Relative 06/28/2023 1  % Final   Basophils Absolute 06/28/2023 0.1  0.0 - 0.1 K/uL Final   Immature Granulocytes 06/28/2023 1  % Final   Abs Immature Granulocytes 06/28/2023 0.05  0.00 - 0.07 K/uL Final   Performed at [facility], [address] [place], [place] [address]   Sodium 06/28/2023 135  135 - 145 mmol/L Final   Potassium 06/28/2023 4.5  3.5 - 5.1 mmol/L Final   Chloride 06/28/2023 100  98 - 111 mmol/L Final   CO2 06/28/2023 26  22 - 32 mmol/L Final   Glucose, Bld 06/28/2023 83  70 - 99 mg/dL Final   Glucose reference range applies only to samples taken after fasting for at least 8 hours.   BUN 06/28/2023 38 (H)  6 - 20 mg/dL Final   Creatinine, Ser 06/28/2023 1.11  0.61 - 1.24 mg/dL Final   Calcium 60/45/4098 9.2  8.9 - 10.3 mg/dL Final   Total Protein 11/91/4782 7.4  6.5 - 8.1 g/dL Final   Albumin 95/62/1308 4.0  3.5 - 5.0 g/dL Final   AST 65/78/4696 22  15 - 41 U/L Final   ALT 06/28/2023 20  0 - 44 U/L Final   Alkaline Phosphatase  06/28/2023 81  38 - 126 U/L Final   Total Bilirubin 06/28/2023 0.3  <1.2 mg/dL Final   GFR, Estimated 06/28/2023 >60  >60 mL/min Final   Comment: (NOTE) Calculated using the CKD-EPI Creatinine Equation (2021)    Anion gap 06/28/2023 9  5 - 15 Final   Performed at [facility], [address] [place], [place] [address]    Blood Alcohol level:  Lab Results  Component Value Date   [facility] <10 11/03/2023   ETH <10 09/17/2022    Metabolic Disorder Labs: Lab Results  Component Value Date   HGBA1C 9.2 (H) 09/17/2022   MPG 217.34 09/17/2022   MPG 263.26 01/18/2022   No results found for: "PROLACTIN" Lab Results  Component Value Date   CHOL 153 09/17/2022   TRIG 58 09/17/2022   HDL 71 09/17/2022   CHOLHDL 2.2 09/17/2022   VLDL 12 09/17/2022   LDLCALC 70 09/17/2022    Therapeutic Lab Levels: No results found for: "LITHIUM" No results found for: "VALPROATE" No results found for: "CBMZ"  Physical Findings   PHQ2-9    Flowsheet Row ED from 11/06/2023 in [facility]  PHQ-2 Total Score 2  PHQ-9 Total Score 9      Flowsheet Row ED from 11/06/2023 in [facility] ED from 11/03/2023 in [facility] Department at [facility] ED from 07/18/2023 in [facility] Emergency Department at [facility]  C-SSRS RISK CATEGORY No Risk No Risk No Risk        Psychiatric Specialty Exam: Physical Exam Constitutional:      Appearance: the patient is not toxic-appearing.  Pulmonary:     Effort: Pulmonary effort is normal.  Neurological:     General: No focal deficit present.     Mental Status: the patient is alert and oriented to person, place, and time.   Review of Systems  Respiratory:  Negative for shortness of breath.   Cardiovascular:  Negative for chest pain.  Gastrointestinal:  Negative for abdominal pain, constipation, diarrhea, nausea and vomiting.  Neurological:  Negative for headaches.       BP (!) 141/81 (BP Location: Right Arm)   Pulse  68   Temp (!) 97.5 F (36.4 C) (Oral)   Resp 16   SpO2 100%   General Appearance: Fairly Groomed  Eye Contact:  Good  Speech:  Clear and Coherent  Volume:  Normal  Mood:  Euthymic  Affect:  Congruent  Thought Process:  Coherent  Orientation:  Full (Time, Place, and Person)  Thought Content: Logical   Suicidal Thoughts:  No  Homicidal Thoughts:  No  Memory:  Immediate;   Good  Judgement:  fair  Insight:  fair  Psychomotor Activity:  Normal  Concentration:  Concentration: Good  Recall:  Good  Fund of Knowledge: Good  Language: Good  Akathisia:  No  Handed:  not assessed  AIMS (if indicated): not done  Assets:  Communication Skills Desire for Improvement Leisure Time Physical Health  ADL's:  Intact  Cognition: WNL  Sleep:  Fair    Treatment Plan Summary: Daily contact with patient to assess and evaluate symptoms and progress in treatment and Medication management.  Stimulant induced psychosis, improving - Start Invega 3 mg now, increase to 6 mg tomorrow, in preparation for [place] shot - Recommend residential rehab.  Patient says he will consider this.  Patient has regular fills for Suboxone, will continue this medication.  EKG and lab work reviewed.  Sodium of 131, hemoglobin 12.2, elevated CBG.  UDS with amphetamines and marijuana.  Recheck labs (BMP, A1c), CBGs ordered.  Patient previously on metformin and glipizide, but recently nonadherent.  EKG with sinus bradycardia with RBBB.  This does not appear on previous EKGs.  Patient denies cardiac history and is not having any symptoms.  No need to recheck at this time.  Patient should follow-up with a PCP afterwards.  Carlyn Reichert, MD 11/06/2023 1:45 PM

## 2023-11-06 NOTE — Group Note (Signed)
 Group Topic: Change and Accountability  Group Date: 11/06/2023 Start Time: 1722 End Time: 1745 Facilitators: Vonzell Schlatter B  Department: [facility]  Number of Participants: 7  Group Focus: affirmation and daily focus Treatment Modality:  Psychoeducation Interventions utilized were patient education and problem solving Purpose: regain self-worth and reinforce self-care  Name: Tanner [name] Date of Birth: December 08, 1965  MR: [id number]    Level of Participation: active Quality of Participation: attentive and cooperative Interactions with others: gave feedback Mood/Affect: positive Triggers (if applicable): N/A Cognition: coherent/clear Progress: Moderate Response: pt wants to get his medication right so that he can go back to work soon 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   Constipation 02/02/2022   Infective endocarditis of tricuspid valve 02/02/2022   MSSA bacteremia 01/26/2022   Septic arthritis of lumbar spine (HCC) 01/17/2022   Controlled type 2 diabetes mellitus without complication, without long-term current use of insulin (HCC) 01/17/2022   Hypocalcemia 01/17/2022   Normocytic anemia 01/17/2022   Hyponatremia 01/17/2022   Hypomagnesemia 01/17/2022   Cellulitis of right foot 05/17/2019   Tobacco abuse    Opioid dependence, uncomplicated (HCC) 02/24/2018   Periodontal disease 10/11/2017   Hepatitis C antibody  positive in blood 10/05/2017   Elevated platelet count 10/05/2017   History of substance abuse (HCC) 09/20/2017   History of vitamin D deficiency 09/20/2017   Essential hypertension 09/20/2017

## 2023-11-06 NOTE — Group Note (Signed)
 Group Topic: Social Support  Group Date: 11/06/2023 Start Time: 2000 End Time: 2030 Facilitators: Rae Lips B  Department: [facility]  Number of Participants: 3  Group Focus: check in Treatment Modality:  Individual Therapy Interventions utilized were support Purpose: express feelings  Name: Havard Radigan Date of Birth: [birth year]  MR: [id number]    Level of Participation: PT DID NOT ATTEND GROUP Quality of Participation: cooperative Interactions with others: gave feedback Mood/Affect: appropriate Triggers (if applicable): NA Cognition: coherent/clear Progress: None Response: NA Plan: patient will be encouraged to go to groups.  Patients Problems:  Patient Active Problem List   Diagnosis Date Noted   Paranoia (psychosis) (HCC) 11/06/2023   Generalized anxiety disorder 11/03/2023   Paranoia (HCC) 11/03/2023   Stimulant-induced psychotic disorder with delusions (HCC) 11/03/2023   Delusion (HCC) 02/10/2022   Acute low back pain    Osteomyelitis (HCC)    Paraspinal abscess (HCC)    IVDU (intravenous drug user) 02/02/2022   Osteomyelitis of lumbar spine (HCC) 02/02/2022   Chronic pain 02/02/2022   Insomnia 02/02/2022   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   Constipation 02/02/2022   Infective endocarditis of tricuspid valve 02/02/2022   MSSA bacteremia 01/26/2022   Septic arthritis of lumbar spine (HCC) 01/17/2022   Controlled type 2 diabetes mellitus without complication, without long-term current use of insulin (HCC) 01/17/2022   Hypocalcemia 01/17/2022   Normocytic anemia 01/17/2022   Hyponatremia 01/17/2022   Hypomagnesemia 01/17/2022   Cellulitis of right foot 05/17/2019   Tobacco abuse    Opioid dependence, uncomplicated (HCC) 02/24/2018   Periodontal disease 10/11/2017   Hepatitis C antibody positive in blood 10/05/2017   Elevated platelet count 10/05/2017   History of substance abuse (HCC) 09/20/2017   History of  vitamin D deficiency 09/20/2017   Essential hypertension 09/20/2017

## 2023-11-06 NOTE — ED Notes (Signed)
 Report called to [facility] 352 RN at [phone].

## 2023-11-06 NOTE — ED Notes (Signed)
 Safe transport arrived. Pt escorted to safe transport with his two belongings bags. Pt paperwork given to safe transport driver.

## 2023-11-06 NOTE — ED Notes (Signed)
Safe transport called 

## 2023-11-06 NOTE — ED Notes (Signed)
 Pt is in his room resting in bed. Pt denies SI/HI/AVH. No acute distress noted. Will continue to monitor for safety.

## 2023-11-06 NOTE — ED Notes (Signed)
 Pt A&O x 4, transfer from [facility], presents with paranoia and substance abuse.  Pt denies SI, HI. Complaint of paranoia.  Pt calm, pleasant and guarded.  No distress noted.  Pt verbalized understanding of unit rules, comfort measures given.  Monitoring for safety.

## 2023-11-06 NOTE — ED Provider Notes (Addendum)
 Facility Based Crisis Admission H&P  Date: 11/09/23 Patient Name: Tanner [name] MRN: [id number] Chief Complaint: "I'm just tired of running and trying to find safe places."  Diagnoses:  Final diagnoses:  Methamphetamine abuse [facility])    HPI: The patient is a 58 year old male who presents for psychiatric evaluation. At the time of assessment, he is observed sitting at the table with his head down. On approach he is engaging in assessment and appears in no acute distress. He is calm, cooperative, and appropriately dressed for the setting. Speech is clear, coherent, and of normal rate and volume. He is alert and oriented to person, place, and situation. He describes his current mood as "ok" with affect congruent to stated mood.  He denies auditory or visual hallucinations, and there is no evidence he is responding to internal stimuli. No delusions are elicited during the interview. He denies suicidal or homicidal ideation. Appetite and sleep are reported as fair. The patient states he feels safe in the hospital and no longer feels that "[employer] is after him."   PHQ 2-9:  Flowsheet Row ED from 11/06/2023 in [facility]  Thoughts that you would be better off dead, or of hurting yourself in some way Several days  PHQ-9 Total Score 17       Flowsheet Row ED from 11/06/2023 in [facility] ED from 11/03/2023 in [facility] Emergency Department at [facility] ED from 07/18/2023 in [facility] Emergency Department at [facility]  C-SSRS RISK CATEGORY No Risk No Risk No Risk         Total Time spent with patient: 20 minutes  Musculoskeletal  Strength & Muscle Tone: within normal limits Gait & Station: normal Patient leans: N/A  Psychiatric Specialty Exam  Presentation General Appearance: Disheveled Eye Contact:Fair Speech:WNL Speech Volume:Normal Handedness:Right  Mood and Affect  Mood:Anxious, Depressed Affect:Flat,  congruent  Thought Process  Thought Processes:Linear Descriptions of Associations:Appropriate Orientation:WDL Thought Content:Congruent Diagnosis of Schizophrenia or Schizoaffective disorder in past: No Duration of Psychotic Symptoms: NA Hallucinations:None Ideas of Reference:none Suicidal Thoughts:Denies Homicidal Thoughts:Denies  Sensorium  Memory:fair Judgment:impaired Insight:Poor  Executive Functions  Concentration: Fair Attention Span:FAir Recall:Fair Fund of Knowledge:Poor Language:Fair  Psychomotor Activity  Psychomotor Activity:WNL  Assets  Assets:Resilience, wants help  Sleep  Sleep:poor    Physical Exam Vitals and nursing note reviewed.  HENT:     Head: Normocephalic and atraumatic.     Nose: Nose normal.     Mouth/Throat:     Mouth: Mucous membranes are moist.  Eyes:     Pupils: Pupils are equal, round, and reactive to light.  Pulmonary:     Effort: Pulmonary effort is normal.  Musculoskeletal:        General: Normal range of motion.     Cervical back: Normal range of motion.  Skin:    General: Skin is dry.  Neurological:     Mental Status: He is alert and oriented to person, place, and time.  Psychiatric:        Attention and Perception: Attention and perception normal.        Mood and Affect: Mood is depressed. Affect is flat.        Speech: Speech normal.        Behavior: Behavior normal. Behavior is cooperative.        Thought Content: Thought content normal. Thought content does not include suicidal ideation. Thought content does not include suicidal plan.  Cognition and Memory: Cognition and memory normal.        Judgment: Judgment is impulsive.    Review of Systems  Psychiatric/Behavioral:  Positive for depression and substance abuse. Negative for suicidal ideas. The patient is nervous/anxious.   All other systems reviewed and are negative.   Blood pressure 126/70, pulse 72, temperature 98.4 F (36.9 C), resp. rate 18,  SpO2 99%. There is no height or weight on file to calculate BMI.  Past Psychiatric History:    Paranoia (HCC)   Generalized anxiety disorder   Stimulant-induced psychotic disorder with delusions (HCC)   Is the patient at risk to self? No  Has the patient been a risk to self in the past 6 months? Yes .    Has the patient been a risk to self within the distant past? No   Is the patient a risk to others? No   Has the patient been a risk to others in the past 6 months? No   Has the patient been a risk to others within the distant past? No   Past Medical History: unknown Family History: unknown Social History:  Patient graduated high school Occupational Hx: Works at [employer] Hx: Denies Living Situation: Patient has his own apartment Spiritual Hx: Yes  Last Labs:  Admission on 11/06/2023  Component Date Value Ref Range Status   Hgb A1c MFr Bld 11/07/2023 8.7 (H)  4.8 - 5.6 % Final   Comment: (NOTE) Pre diabetes:          5.7%-6.4%  Diabetes:              >6.4%  Glycemic control for   <7.0% adults with diabetes    Mean Plasma Glucose 11/07/2023 202.99  mg/dL Final   Performed at [facility], [address], [place], [place] [address]   Sodium 11/07/2023 132 (L)  135 - 145 mmol/L Final   Potassium 11/07/2023 4.6  3.5 - 5.1 mmol/L Final   Chloride 11/07/2023 95 (L)  98 - 111 mmol/L Final   CO2 11/07/2023 26  22 - 32 mmol/L Final   Glucose, Bld 11/07/2023 251 (H)  70 - 99 mg/dL Final   Glucose reference range applies only to samples taken after fasting for at least 8 hours.   BUN 11/07/2023 22 (H)  6 - 20 mg/dL Final   Creatinine, Ser 11/07/2023 0.99  0.61 - 1.24 mg/dL Final   Calcium 11/91/4782 9.0  8.9 - 10.3 mg/dL Final   GFR, Estimated 11/07/2023 >60  >60 mL/min Final   Comment: (NOTE) Calculated using the CKD-EPI Creatinine Equation (2021)    Anion gap 11/07/2023 11  5 - 15 Final   Performed at [facility], [address] [place], [place] [address]    Glucose-Capillary 11/07/2023 265 (H)  70 - 99 mg/dL Final   Glucose reference range applies only to samples taken after fasting for at least 8 hours.   Glucose-Capillary 11/06/2023 228 (H)  70 - 99 mg/dL Final   Glucose reference range applies only to samples taken after fasting for at least 8 hours.   Glucose-Capillary 11/07/2023 256 (H)  70 - 99 mg/dL Final   Glucose reference range applies only to samples taken after fasting for at least 8 hours.   Glucose-Capillary 11/08/2023 261 (H)  70 - 99 mg/dL Final   Glucose reference range applies only to samples taken after fasting for at least 8 hours.   Glucose-Capillary 11/08/2023 234 (H)  70 -  99 mg/dL Final   Glucose reference range applies only to samples taken after fasting for at least 8 hours.   Glucose-Capillary 11/08/2023 364 (H)  70 - 99 mg/dL Final   Glucose reference range applies only to samples taken after fasting for at least 8 hours.   Glucose-Capillary 11/09/2023 238 (H)  70 - 99 mg/dL Final   Glucose reference range applies only to samples taken after fasting for at least 8 hours.   Glucose-Capillary 11/09/2023 303 (H)  70 - 99 mg/dL Final   Glucose reference range applies only to samples taken after fasting for at least 8 hours.   Glucose-Capillary 11/09/2023 221 (H)  70 - 99 mg/dL Final   Glucose reference range applies only to samples taken after fasting for at least 8 hours.  Admission on 11/03/2023, Discharged on 11/06/2023  Component Date Value Ref Range Status   Sodium 11/03/2023 131 (L)  135 - 145 mmol/L Final   Potassium 11/03/2023 4.0  3.5 - 5.1 mmol/L Final   Chloride 11/03/2023 97 (L)  98 - 111 mmol/L Final   CO2 11/03/2023 26  22 - 32 mmol/L Final   Glucose, Bld 11/03/2023 357 (H)  70 - 99 mg/dL Final   Glucose reference range applies only to samples taken after fasting for at least 8 hours.   BUN 11/03/2023 21 (H)  6 - 20 mg/dL Final   Creatinine, Ser 11/03/2023 0.78  0.61 - 1.24 mg/dL Final   Calcium  16/05/9603 9.1  8.9 - 10.3 mg/dL Final   Total Protein 54/04/8118 7.0  6.5 - 8.1 g/dL Final   Albumin 14/78/2956 4.0  3.5 - 5.0 g/dL Final   AST 21/30/8657 31  15 - 41 U/L Final   ALT 11/03/2023 33  0 - 44 U/L Final   Alkaline Phosphatase 11/03/2023 98  38 - 126 U/L Final   Total Bilirubin 11/03/2023 0.5  0.0 - 1.2 mg/dL Final   GFR, Estimated 11/03/2023 >60  >60 mL/min Final   Comment: (NOTE) Calculated using the CKD-EPI Creatinine Equation (2021)    Anion gap 11/03/2023 8  5 - 15 Final   Performed at [facility], [address] [place], [place] [address]   Alcohol, Ethyl (B) 11/03/2023 <10  <10 mg/dL Final   Comment: (NOTE) Lowest detectable limit for serum alcohol is 10 mg/dL.  For medical purposes only. Performed at [facility], [address] [place], [place] [address]    Opiates 11/03/2023 NONE DETECTED  NONE DETECTED Final   Cocaine 11/03/2023 NONE DETECTED  NONE DETECTED Final   Benzodiazepines 11/03/2023 NONE DETECTED  NONE DETECTED Final   Amphetamines 11/03/2023 POSITIVE (A)  NONE DETECTED Final   Comment: (NOTE) Trazodone is metabolized in vivo to several metabolites, including pharmacologically active m-CPP, which is excreted in the urine. Immunoassay screens for amphetamines and MDMA have potential cross-reactivity with these compounds and may provide false positive  results.     Tetrahydrocannabinol 11/03/2023 POSITIVE (A)  NONE DETECTED Final   Barbiturates 11/03/2023 NONE DETECTED  NONE DETECTED Final   Comment: (NOTE) DRUG SCREEN FOR MEDICAL PURPOSES ONLY.  IF CONFIRMATION IS NEEDED FOR ANY PURPOSE, NOTIFY LAB WITHIN 5 DAYS.  LOWEST DETECTABLE LIMITS FOR URINE DRUG SCREEN Drug Class                     Cutoff (ng/mL) Amphetamine and metabolites    1000 Barbiturate and metabolites    200 Benzodiazepine  200 Opiates and metabolites        300 Cocaine and metabolites        300 THC                             50 Performed at [facility], [address], [place], [place] [address]    WBC 11/03/2023 4.6  4.0 - 10.5 K/uL Final   RBC 11/03/2023 4.37  4.22 - 5.81 MIL/uL Final   Hemoglobin 11/03/2023 12.2 (L)  13.0 - 17.0 g/dL Final   HCT 60/45/4098 35.9 (L)  39.0 - 52.0 % Final   MCV 11/03/2023 82.2  80.0 - 100.0 fL Final   MCH 11/03/2023 27.9  26.0 - 34.0 pg Final   MCHC 11/03/2023 34.0  30.0 - 36.0 g/dL Final   RDW 11/91/4782 12.9  11.5 - 15.5 % Final   Platelets 11/03/2023 288  150 - 400 K/uL Final   nRBC 11/03/2023 0.0  0.0 - 0.2 % Final   Neutrophils Relative % 11/03/2023 66  % Final   Neutro Abs 11/03/2023 3.0  1.7 - 7.7 K/uL Final   Lymphocytes Relative 11/03/2023 22  % Final   Lymphs Abs 11/03/2023 1.0  0.7 - 4.0 K/uL Final   Monocytes Relative 11/03/2023 9  % Final   Monocytes Absolute 11/03/2023 0.4  0.1 - 1.0 K/uL Final   Eosinophils Relative 11/03/2023 2  % Final   Eosinophils Absolute 11/03/2023 0.1  0.0 - 0.5 K/uL Final   Basophils Relative 11/03/2023 1  % Final   Basophils Absolute 11/03/2023 0.0  0.0 - 0.1 K/uL Final   Immature Granulocytes 11/03/2023 0  % Final   Abs Immature Granulocytes 11/03/2023 0.01  0.00 - 0.07 K/uL Final   Performed at [facility], [address], [place], [place] [address]   Glucose-Capillary 11/03/2023 180 (H)  70 - 99 mg/dL Final   Glucose reference range applies only to samples taken after fasting for at least 8 hours.   SARS Coronavirus 2 by RT PCR 11/04/2023 NEGATIVE  NEGATIVE Final   Comment: (NOTE) SARS-CoV-2 target nucleic acids are NOT DETECTED.  The SARS-CoV-2 RNA is generally detectable in upper respiratory specimens during the acute phase of infection. The lowest concentration of SARS-CoV-2 viral copies this assay can detect is 138 copies/mL. A negative result does not preclude SARS-Cov-2 infection and should not be used as the sole basis for treatment or other patient management  decisions. A negative result may occur with  improper specimen collection/handling, submission of specimen other than nasopharyngeal swab, presence of viral mutation(s) within the areas targeted by this assay, and inadequate number of viral copies(<138 copies/mL). A negative result must be combined with clinical observations, patient history, and epidemiological information. The expected result is Negative.  Fact Sheet for Patients:  BloggerCourse.com  Fact Sheet for Healthcare Providers:  SeriousBroker.it  This test is no                          t yet approved or cleared by the [place] FDA and  has been authorized for detection and/or diagnosis of SARS-CoV-2 by FDA under an Emergency Use Authorization (EUA). This EUA will remain  in effect (meaning this test can be used) for the duration of the COVID-19 declaration under Section 564(b)(1) of the Act, 21 U.S.C.section 360bbb-3(b)(1), unless the authorization is terminated  or revoked sooner.  Influenza A by PCR 11/04/2023 NEGATIVE  NEGATIVE Final   Influenza B by PCR 11/04/2023 NEGATIVE  NEGATIVE Final   Comment: (NOTE) The Xpert Xpress SARS-CoV-2/FLU/RSV plus assay is intended as an aid in the diagnosis of influenza from Nasopharyngeal swab specimens and should not be used as a sole basis for treatment. Nasal washings and aspirates are unacceptable for Xpert Xpress SARS-CoV-2/FLU/RSV testing.  Fact Sheet for Patients: BloggerCourse.com  Fact Sheet for Healthcare Providers: SeriousBroker.it  This test is not yet approved or cleared by the [place] FDA and has been authorized for detection and/or diagnosis of SARS-CoV-2 by FDA under an Emergency Use Authorization (EUA). This EUA will remain in effect (meaning this test can be used) for the duration of the COVID-19 declaration under Section 564(b)(1) of the Act,  21 U.S.C. section 360bbb-3(b)(1), unless the authorization is terminated or revoked.     Resp Syncytial Virus by PCR 11/04/2023 NEGATIVE  NEGATIVE Final   Comment: (NOTE) Fact Sheet for Patients: BloggerCourse.com  Fact Sheet for Healthcare Providers: SeriousBroker.it  This test is not yet approved or cleared by the [place] FDA and has been authorized for detection and/or diagnosis of SARS-CoV-2 by FDA under an Emergency Use Authorization (EUA). This EUA will remain in effect (meaning this test can be used) for the duration of the COVID-19 declaration under Section 564(b)(1) of the Act, 21 U.S.C. section 360bbb-3(b)(1), unless the authorization is terminated or revoked.  Performed at [facility], [address], [place], [place] [address]    Glucose-Capillary 11/04/2023 171 (H)  70 - 99 mg/dL Final   Glucose reference range applies only to samples taken after fasting for at least 8 hours.  Admission on 06/28/2023, Discharged on 06/28/2023  Component Date Value Ref Range Status   Glucose-Capillary 06/28/2023 81  70 - 99 mg/dL Final   Glucose reference range applies only to samples taken after fasting for at least 8 hours.   Comment 1 06/28/2023 Notify RN   Final   WBC 06/28/2023 9.5  4.0 - 10.5 K/uL Final   RBC 06/28/2023 4.29  4.22 - 5.81 MIL/uL Final   Hemoglobin 06/28/2023 12.5 (L)  13.0 - 17.0 g/dL Final   HCT 13/24/4010 36.3 (L)  39.0 - 52.0 % Final   MCV 06/28/2023 84.6  80.0 - 100.0 fL Final   MCH 06/28/2023 29.1  26.0 - 34.0 pg Final   MCHC 06/28/2023 34.4  30.0 - 36.0 g/dL Final   RDW 27/25/3664 13.0  11.5 - 15.5 % Final   Platelets 06/28/2023 374  150 - 400 K/uL Final   nRBC 06/28/2023 0.0  0.0 - 0.2 % Final   Neutrophils Relative % 06/28/2023 70  % Final   Neutro Abs 06/28/2023 6.8  1.7 - 7.7 K/uL Final   Lymphocytes Relative 06/28/2023 20  % Final   Lymphs Abs 06/28/2023 1.9  0.7 - 4.0  K/uL Final   Monocytes Relative 06/28/2023 7  % Final   Monocytes Absolute 06/28/2023 0.6  0.1 - 1.0 K/uL Final   Eosinophils Relative 06/28/2023 1  % Final   Eosinophils Absolute 06/28/2023 0.1  0.0 - 0.5 K/uL Final   Basophils Relative 06/28/2023 1  % Final   Basophils Absolute 06/28/2023 0.1  0.0 - 0.1 K/uL Final   Immature Granulocytes 06/28/2023 1  % Final   Abs Immature Granulocytes 06/28/2023 0.05  0.00 - 0.07 K/uL Final   Performed at [facility], [address], [place], [place] [address]  Sodium 06/28/2023 135  135 - 145 mmol/L Final   Potassium 06/28/2023 4.5  3.5 - 5.1 mmol/L Final   Chloride 06/28/2023 100  98 - 111 mmol/L Final   CO2 06/28/2023 26  22 - 32 mmol/L Final   Glucose, Bld 06/28/2023 83  70 - 99 mg/dL Final   Glucose reference range applies only to samples taken after fasting for at least 8 hours.   BUN 06/28/2023 38 (H)  6 - 20 mg/dL Final   Creatinine, Ser 06/28/2023 1.11  0.61 - 1.24 mg/dL Final   Calcium 16/05/9603 9.2  8.9 - 10.3 mg/dL Final   Total Protein 54/04/8118 7.4  6.5 - 8.1 g/dL Final   Albumin 14/78/2956 4.0  3.5 - 5.0 g/dL Final   AST 21/30/8657 22  15 - 41 U/L Final   ALT 06/28/2023 20  0 - 44 U/L Final   Alkaline Phosphatase 06/28/2023 81  38 - 126 U/L Final   Total Bilirubin 06/28/2023 0.3  <1.2 mg/dL Final   GFR, Estimated 06/28/2023 >60  >60 mL/min Final   Comment: (NOTE) Calculated using the CKD-EPI Creatinine Equation (2021)    Anion gap 06/28/2023 9  5 - 15 Final   Performed at [facility], [address] [place], [place] [address]    Allergies: Trazodone and nefazodone  Medications:  Facility Ordered Medications  Medication   acetaminophen (TYLENOL) tablet 650 mg   alum & mag hydroxide-simeth (MAALOX/MYLANTA) 200-200-20 MG/5ML suspension 30 mL   magnesium hydroxide (MILK OF MAGNESIA) suspension 30 mL   haloperidol (HALDOL) tablet 5 mg   And   diphenhydrAMINE (BENADRYL) capsule 50 mg   haloperidol  lactate (HALDOL) injection 5 mg   And   diphenhydrAMINE (BENADRYL) injection 50 mg   And   LORazepam (ATIVAN) injection 2 mg   haloperidol lactate (HALDOL) injection 10 mg   And   diphenhydrAMINE (BENADRYL) injection 50 mg   And   LORazepam (ATIVAN) injection 2 mg   hydrOXYzine (ATARAX) tablet 25 mg   traZODone (DESYREL) tablet 50 mg   buprenorphine-naloxone (SUBOXONE) 8-2 mg per SL tablet 1 tablet   [COMPLETED] paliperidone (INVEGA) 24 hr tablet 3 mg   Followed by   [COMPLETED] paliperidone (INVEGA) 24 hr tablet 6 mg   paliperidone (INVEGA) 24 hr tablet 6 mg   docusate sodium (COLACE) capsule 100 mg   insulin aspart (novoLOG) injection 0-15 Units   insulin aspart (novoLOG) injection 0-5 Units   nicotine polacrilex (NICORETTE) gum 2 mg   PTA Medications  Medication Sig   ARIPiprazole (ABILIFY) 5 MG tablet Take 5 mg by mouth at bedtime.   buprenorphine-naloxone (SUBOXONE) 8-2 mg SUBL SL tablet Place 1 tablet under the tongue 2 (two) times daily.    Long Term Goals: Improvement in symptoms so as ready for discharge  Short Term Goals: Patient will verbalize feelings in meetings with treatment team members., Patient will attend at least of 50% of the groups daily., Pt will complete the PHQ9 on admission, day 3 and discharge., Patient will participate in completing the [place] Suicide Severity Rating Scale, Patient will score a low risk of violence for 24 hours prior to discharge, and Patient will take medications as prescribed daily.  Medical Decision Making  Patient will be admitted to [facility] for substance abuse and psychiatric stabilization.    Recommendations  Based on my evaluation the patient does not appear to have an emergency medical condition.  Jearld Lesch, NP 11/09/23  8:10 PM

## 2023-11-07 DIAGNOSIS — I4519 Other right bundle-branch block: Secondary | ICD-10-CM | POA: Diagnosis not present

## 2023-11-07 DIAGNOSIS — E119 Type 2 diabetes mellitus without complications: Secondary | ICD-10-CM | POA: Diagnosis not present

## 2023-11-07 DIAGNOSIS — F1510 Other stimulant abuse, uncomplicated: Secondary | ICD-10-CM | POA: Diagnosis not present

## 2023-11-07 LAB — BASIC METABOLIC PANEL
Anion gap: 11 (ref 5–15)
BUN: 22 mg/dL — ABNORMAL HIGH (ref 6–20)
CO2: 26 mmol/L (ref 22–32)
Calcium: 9 mg/dL (ref 8.9–10.3)
Chloride: 95 mmol/L — ABNORMAL LOW (ref 98–111)
Creatinine, Ser: 0.99 mg/dL (ref 0.61–1.24)
GFR, Estimated: >60 mL/min (ref >60)
Glucose, Bld: 251 mg/dL — ABNORMAL HIGH (ref 70–99)
Potassium: 4.6 mmol/L (ref 3.5–5.1)
Sodium: 132 mmol/L — ABNORMAL LOW (ref 135–145)

## 2023-11-07 LAB — GLUCOSE, CAPILLARY
Glucose-Capillary: 228 mg/dL — ABNORMAL HIGH (ref 70–99)
Glucose-Capillary: 265 mg/dL — ABNORMAL HIGH (ref 70–99)
Glucose-Capillary: 256 mg/dL — ABNORMAL HIGH (ref 70–99)

## 2023-11-07 LAB — HEMOGLOBIN A1C
Hgb A1c MFr Bld: 8.7 % — ABNORMAL HIGH (ref 4.8–5.6)
Mean Plasma Glucose: 202.99 mg/dL

## 2023-11-07 MED ORDER — PALIPERIDONE ER 6 MG PO TB24
6.0000 mg | ORAL_TABLET | Freq: Every day | ORAL | Status: DC
  Administered 2023-11-08 – 2023-11-10 (×3): 6 mg via ORAL
  Filled 2023-11-07 (×3): qty 1

## 2023-11-07 NOTE — Discharge Instructions (Signed)
 Resumption of Care at [facility] Address: [address] [place], [place] [address] Phone: [phone]  Outpatient Service Options  [facility] [address] [place], [place] [address] [phone]   [facility] [address], [place], [address] [phone] phone  New Patient Assessment/Therapy Walk-Ins:  Monday and Wednesday: 8 am until slots are full. Every 1st and 2nd Fridays of the month: 1 pm - 5 pm.  NO ASSESSMENT/THERAPY WALK-INS ON TUESDAYS OR THURSDAYS  New Patient Assessment/Medication Management Walk-Ins:  Monday - Friday:  8 am - 11 am.  For all walk-ins, we ask that you arrive by 7:30 am because patients will be seen in the order of arrival.  Availability is limited; therefore, you may not be seen on the same day that you walk-in.  Our goal is to serve and meet the needs of our community to the best of our [place] ability.  SUBSTANCE USE TREATMENT for Medicaid and State Funded/IPRS  [facility] ([facility]) [address], [place], [address] [phone] phone NOTE: [facility] is no longer offering IOP services.  Serves those who are low-income or have no insurance.  [facility] [address] [place], [place], [address] [phone] phone [phone] fax NOTE: Does have Substance Abuse-Intensive Outpatient Program [facility]) as well as transitional housing if eligible.  [facility] [address] [place], [place], [address] [phone] phone [phone] fax  [facility] [phone] [place], [place], [address] [phone] phone [phone] fax  HALFWAY HOUSES:  [facility] [phone]  [email or web address].oxfordvacancies.[email or web address]  12 STEP PROGRAMS:  Alcoholics Anonymous of [place] SoftwareChalet.be  Narcotics Anonymous of [place] HitProtect.dk  Al-Anon of [place], [place] www.[place]alanon.org/find-meetings.html  Nar-Anon  https://nar-anon.org/find-a-meetin  List of Residential placements:   [facility] in [place]: [phone]  [facility]: [phone]  [place], [place] [phone]: Male and male facility; 30-day program: (uninsured and Medicaid such as Laurena Bering, [place], [place], partners)  [facility]: [phone]; men and women's facility; 28 days; Can have Medicaid tailored plan Tour manager or Partners)  [facility]: [phone] Karoline Caldwell or Larita Fife; 28 day program; must be fully detox; tailored Medicaid or no insurance  [address] in [place], [place]; [phone]; 28 day all males program; no insurance accepted  [facility] Referral in [place]: Gabriel Rung [phone] (no insurance or Medicaid only); 90 days; outpatient services but provide housing in apartments downtown [place]  RTS Admission: [phone]: Patient must complete phone screening for placement: [place], [place]; 6 month program; uninsured, Medicaid, and [employer].   [facility]: no insurance required; [phone]  [facility]: [phone]; Intake: Molly Maduro; Must fill out application online; Alecia Lemming Delay [phone] x [address] [facility] in [place], [place]: [phone]; Admissions Coordinators Mr. Maurine Minister or Barron Alvine; 90 day program.  [facility]: [place], [place] [phone]; Co-Ed 9 month to a year program; Online application; Men entry fee is $500 (6-53months);  [employer]: [address] [place], [place] [address]; no fee or insurance required; minimum of 2 years; Highly structured; work based; Intake Coordinator is Thayer Ohm [phone]  [facility] in [place], [place]: [phone]; Fax number is [phone]; website: [email or web address].[facility].org; Requires 3-6 page autobiography; 2 year program (18 months and then 3month transitional housing); Admission fee is $300; no insurance needed; work  Automotive engineer in [place], [place]: [employer] Desk Staff: Danise Edge [phone]: They have a Men's Regenerations Program 6-52months. Free program; There is an initial $300 fee however, they  are willing to work with patients regarding that. Application is online.  [facility]: Admissions [phone] Doran Heater ext [phone]; Any 7-90 day program is out of pocket; 12 month program is free of charge; there is a $275 entry fee; Patient is responsible for own transportation

## 2023-11-07 NOTE — Group Note (Signed)
 Group Topic: Relapse and Recovery  Group Date: 11/07/2023 Start Time: 1000 End Time: 1100 Facilitators: Wonda Cheng, LPN  Department: [facility]  Number of Participants: 9  Group Focus: coping skills, nursing group, and relapse prevention Treatment Modality:  Patient-Centered Therapy Interventions utilized were group exercise Purpose: increase insight and relapse prevention strategies  Name: Tanner [name] Date of Birth: 1965-12-19  MR: [id number]    Level of Participation: active Quality of Participation: cooperative Interactions with others: gave feedback Mood/Affect: appropriate Triggers (if applicable): n/a Cognition: coherent/clear Progress: Minimal Response: appropriate reponse to therapy Plan: patient will be encouraged to continue participation with group therapy and practice skills learned during sessions.  Patients Problems:  Patient Active Problem List   Diagnosis Date Noted   Paranoia (psychosis) (HCC) 11/06/2023   Generalized anxiety disorder 11/03/2023   Paranoia (HCC) 11/03/2023   Stimulant-induced psychotic disorder with delusions (HCC) 11/03/2023   Delusion (HCC) 02/10/2022   Acute low back pain    Osteomyelitis (HCC)    Paraspinal abscess (HCC)    IVDU (intravenous drug user) 02/02/2022   Osteomyelitis of lumbar spine (HCC) 02/02/2022   Chronic pain 02/02/2022   Insomnia 02/02/2022   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   Constipation 02/02/2022   Infective endocarditis of tricuspid valve 02/02/2022   MSSA bacteremia 01/26/2022   Septic arthritis of lumbar spine (HCC) 01/17/2022   Controlled type 2 diabetes mellitus without complication, without long-term current use of insulin (HCC) 01/17/2022   Hypocalcemia 01/17/2022   Normocytic anemia 01/17/2022   Hyponatremia 01/17/2022   Hypomagnesemia 01/17/2022   Cellulitis of right foot 05/17/2019   Tobacco abuse    Opioid dependence, uncomplicated (HCC)  02/24/2018   Periodontal disease 10/11/2017   Hepatitis C antibody positive in blood 10/05/2017   Elevated platelet count 10/05/2017   History of substance abuse (HCC) 09/20/2017   History of vitamin D deficiency 09/20/2017   Essential hypertension 09/20/2017

## 2023-11-07 NOTE — ED Provider Notes (Signed)
 Behavioral Health Progress Note  Date and Time: 11/07/2023 2:05 PM Name: Tanner [name] MRN:  [id number]   he patient is a 59 year old male with a history of methamphetamine and marijuana abuse.  Over the past year he has been admitted to [facility] on 2 occasions, both of which appear to be related to stimulant induced psychosis.  At the last hospitalization (in November 2024), the patient received Gean Birchwood.  On the present occasion, 11/03/2023, the patient presented to [facility] long ED requesting an [place] shot and was noted to be experiencing psychotic symptoms.  He was transferred to the facility based crisis for further treatment.  Subjective:    Chart reviewed, case discussed with staff, patient seen today during rounds.  On assessment patient endorsed" fine" mood.  Patient continues to maintain the fact that he came to the ER to get Hinda Glatter sustain a long-acting injection, which according to patient he received last in November 2024.  Patient continues to have paranoia about people trying to harm him.  Patient was provided with support and reassurance.  Patient denies auditory or visual hallucinations today.  He denies thoughts of harming himself or others.  Patient was encouraged to abstain from drug use.  Patient reports that he is interested in exploring the rehab options.  He later mentioned that he would like to consider going to [facility].  Patient was encouraged to work with Child psychotherapist on rehab options.    Chart review shows that patient was admitted to [facility] in November. Per discharge summary- 11/9: Patient agreed to Gean Birchwood, first dose 11/9 234 mg, and second dose on 11/13 156 mg prior to discharge   Diagnosis:  Final diagnoses:  Methamphetamine abuse (HCC)    Total Time spent with patient: 15 minutes  Past Psychiatric History: See H&P and above Past Medical History: See H&P and above Family History: See H&P and above Family  Psychiatric  History: See H&P and above Social History: See H&P and above  Additional Social History:  none  Sleep: fair  Appetite:  fair  Current Medications:  Current Facility-Administered Medications  Medication Dose Route Frequency Provider Last Rate Last Admin   acetaminophen (TYLENOL) tablet 650 mg  650 mg Oral Q6H PRN Dixon, Rashaun M, NP       alum & mag hydroxide-simeth (MAALOX/MYLANTA) 200-200-20 MG/5ML suspension 30 mL  30 mL Oral Q4H PRN Durwin Nora, Rashaun M, NP       buprenorphine-naloxone (SUBOXONE) 8-2 mg per SL tablet 1 tablet  1 tablet Sublingual BID Jearld Lesch, NP   1 tablet at 11/07/23 4098   haloperidol (HALDOL) tablet 5 mg  5 mg Oral TID PRN Jearld Lesch, NP       And   diphenhydrAMINE (BENADRYL) capsule 50 mg  50 mg Oral TID PRN Jearld Lesch, NP       haloperidol lactate (HALDOL) injection 5 mg  5 mg Intramuscular TID PRN Jearld Lesch, NP       And   diphenhydrAMINE (BENADRYL) injection 50 mg  50 mg Intramuscular TID PRN Jearld Lesch, NP       And   LORazepam (ATIVAN) injection 2 mg  2 mg Intramuscular TID PRN Jearld Lesch, NP       haloperidol lactate (HALDOL) injection 10 mg  10 mg Intramuscular TID PRN Jearld Lesch, NP       And   diphenhydrAMINE (BENADRYL) injection 50 mg  50 mg Intramuscular TID  PRN Jearld Lesch, NP       And   LORazepam (ATIVAN) injection 2 mg  2 mg Intramuscular TID PRN Jearld Lesch, NP       hydrOXYzine (ATARAX) tablet 25 mg  25 mg Oral TID PRN Jearld Lesch, NP   25 mg at 11/06/23 0329   magnesium hydroxide (MILK OF MAGNESIA) suspension 30 mL  30 mL Oral Daily PRN Jearld Lesch, NP       traZODone (DESYREL) tablet 50 mg  50 mg Oral QHS PRN Jearld Lesch, NP   50 mg at 11/06/23 2229   Current Outpatient Medications  Medication Sig Dispense Refill   ARIPiprazole (ABILIFY) 5 MG tablet Take 5 mg by mouth at bedtime.     buprenorphine-naloxone (SUBOXONE) 8-2 mg SUBL SL tablet Place 1 tablet under  the tongue 2 (two) times daily.      Labs  Lab Results:  Admission on 11/06/2023  Component Date Value Ref Range Status   Hgb A1c MFr Bld 11/07/2023 8.7 (H)  4.8 - 5.6 % Final   Comment: (NOTE) Pre diabetes:          5.7%-6.4%  Diabetes:              >6.4%  Glycemic control for   <7.0% adults with diabetes    Mean Plasma Glucose 11/07/2023 202.99  mg/dL Final   Performed at [facility], [address], [place], [place] [address]   Sodium 11/07/2023 132 (L)  135 - 145 mmol/L Final   Potassium 11/07/2023 4.6  3.5 - 5.1 mmol/L Final   Chloride 11/07/2023 95 (L)  98 - 111 mmol/L Final   CO2 11/07/2023 26  22 - 32 mmol/L Final   Glucose, Bld 11/07/2023 251 (H)  70 - 99 mg/dL Final   Glucose reference range applies only to samples taken after fasting for at least 8 hours.   BUN 11/07/2023 22 (H)  6 - 20 mg/dL Final   Creatinine, Ser 11/07/2023 0.99  0.61 - 1.24 mg/dL Final   Calcium 60/45/4098 9.0  8.9 - 10.3 mg/dL Final   GFR, Estimated 11/07/2023 >60  >60 mL/min Final   Comment: (NOTE) Calculated using the CKD-EPI Creatinine Equation (2021)    Anion gap 11/07/2023 11  5 - 15 Final   Performed at [facility], [address], [place], [place] [address]   Glucose-Capillary 11/07/2023 265 (H)  70 - 99 mg/dL Final   Glucose reference range applies only to samples taken after fasting for at least 8 hours.   Glucose-Capillary 11/06/2023 228 (H)  70 - 99 mg/dL Final   Glucose reference range applies only to samples taken after fasting for at least 8 hours.   Glucose-Capillary 11/07/2023 256 (H)  70 - 99 mg/dL Final   Glucose reference range applies only to samples taken after fasting for at least 8 hours.  Admission on 11/03/2023, Discharged on 11/06/2023  Component Date Value Ref Range Status   Sodium 11/03/2023 131 (L)  135 - 145 mmol/L Final   Potassium 11/03/2023 4.0  3.5 - 5.1 mmol/L Final   Chloride 11/03/2023 97 (L)  98 - 111 mmol/L Final   CO2 11/03/2023 26  22 -  32 mmol/L Final   Glucose, Bld 11/03/2023 357 (H)  70 - 99 mg/dL Final   Glucose reference range applies only to samples taken after fasting for at least 8 hours.   BUN 11/03/2023 21 (H)  6 -  20 mg/dL Final   Creatinine, Ser 11/03/2023 0.78  0.61 - 1.24 mg/dL Final   Calcium 16/05/9603 9.1  8.9 - 10.3 mg/dL Final   Total Protein 54/04/8118 7.0  6.5 - 8.1 g/dL Final   Albumin 14/78/2956 4.0  3.5 - 5.0 g/dL Final   AST 21/30/8657 31  15 - 41 U/L Final   ALT 11/03/2023 33  0 - 44 U/L Final   Alkaline Phosphatase 11/03/2023 98  38 - 126 U/L Final   Total Bilirubin 11/03/2023 0.5  0.0 - 1.2 mg/dL Final   GFR, Estimated 11/03/2023 >60  >60 mL/min Final   Comment: (NOTE) Calculated using the CKD-EPI Creatinine Equation (2021)    Anion gap 11/03/2023 8  5 - 15 Final   Performed at [facility], [address] [place], [place] [address]   Alcohol, Ethyl (B) 11/03/2023 <10  <10 mg/dL Final   Comment: (NOTE) Lowest detectable limit for serum alcohol is 10 mg/dL.  For medical purposes only. Performed at [facility], [address], [place], [place] [address]    Opiates 11/03/2023 NONE DETECTED  NONE DETECTED Final   Cocaine 11/03/2023 NONE DETECTED  NONE DETECTED Final   Benzodiazepines 11/03/2023 NONE DETECTED  NONE DETECTED Final   Amphetamines 11/03/2023 POSITIVE (A)  NONE DETECTED Final   Comment: (NOTE) Trazodone is metabolized in vivo to several metabolites, including pharmacologically active m-CPP, which is excreted in the urine. Immunoassay screens for amphetamines and MDMA have potential cross-reactivity with these compounds and may provide false positive  results.     Tetrahydrocannabinol 11/03/2023 POSITIVE (A)  NONE DETECTED Final   Barbiturates 11/03/2023 NONE DETECTED  NONE DETECTED Final   Comment: (NOTE) DRUG SCREEN FOR MEDICAL PURPOSES ONLY.  IF CONFIRMATION IS NEEDED FOR ANY PURPOSE, NOTIFY LAB WITHIN 5 DAYS.  LOWEST  DETECTABLE LIMITS FOR URINE DRUG SCREEN Drug Class                     Cutoff (ng/mL) Amphetamine and metabolites    1000 Barbiturate and metabolites    200 Benzodiazepine                 200 Opiates and metabolites        300 Cocaine and metabolites        300 THC                            50 Performed at [facility], [address], [place], [place] [address]    WBC 11/03/2023 4.6  4.0 - 10.5 K/uL Final   RBC 11/03/2023 4.37  4.22 - 5.81 MIL/uL Final   Hemoglobin 11/03/2023 12.2 (L)  13.0 - 17.0 g/dL Final   HCT 40/05/2724 35.9 (L)  39.0 - 52.0 % Final   MCV 11/03/2023 82.2  80.0 - 100.0 fL Final   MCH 11/03/2023 27.9  26.0 - 34.0 pg Final   MCHC 11/03/2023 34.0  30.0 - 36.0 g/dL Final   RDW 36/64/4034 12.9  11.5 - 15.5 % Final   Platelets 11/03/2023 288  150 - 400 K/uL Final   nRBC 11/03/2023 0.0  0.0 - 0.2 % Final   Neutrophils Relative % 11/03/2023 66  % Final   Neutro Abs 11/03/2023 3.0  1.7 - 7.7 K/uL Final   Lymphocytes Relative 11/03/2023 22  % Final   Lymphs Abs 11/03/2023 1.0  0.7 - 4.0 K/uL Final   Monocytes  Relative 11/03/2023 9  % Final   Monocytes Absolute 11/03/2023 0.4  0.1 - 1.0 K/uL Final   Eosinophils Relative 11/03/2023 2  % Final   Eosinophils Absolute 11/03/2023 0.1  0.0 - 0.5 K/uL Final   Basophils Relative 11/03/2023 1  % Final   Basophils Absolute 11/03/2023 0.0  0.0 - 0.1 K/uL Final   Immature Granulocytes 11/03/2023 0  % Final   Abs Immature Granulocytes 11/03/2023 0.01  0.00 - 0.07 K/uL Final   Performed at [facility], [address], [place], [place] [address]   Glucose-Capillary 11/03/2023 180 (H)  70 - 99 mg/dL Final   Glucose reference range applies only to samples taken after fasting for at least 8 hours.   SARS Coronavirus 2 by RT PCR 11/04/2023 NEGATIVE  NEGATIVE Final   Comment: (NOTE) SARS-CoV-2 target nucleic acids are NOT DETECTED.  The SARS-CoV-2 RNA is generally detectable in upper  respiratory specimens during the acute phase of infection. The lowest concentration of SARS-CoV-2 viral copies this assay can detect is 138 copies/mL. A negative result does not preclude SARS-Cov-2 infection and should not be used as the sole basis for treatment or other patient management decisions. A negative result may occur with  improper specimen collection/handling, submission of specimen other than nasopharyngeal swab, presence of viral mutation(s) within the areas targeted by this assay, and inadequate number of viral copies(<138 copies/mL). A negative result must be combined with clinical observations, patient history, and epidemiological information. The expected result is Negative.  Fact Sheet for Patients:  BloggerCourse.com  Fact Sheet for Healthcare Providers:  SeriousBroker.it  This test is no                          t yet approved or cleared by the [place] FDA and  has been authorized for detection and/or diagnosis of SARS-CoV-2 by FDA under an Emergency Use Authorization (EUA). This EUA will remain  in effect (meaning this test can be used) for the duration of the COVID-19 declaration under Section 564(b)(1) of the Act, 21 U.S.C.section 360bbb-3(b)(1), unless the authorization is terminated  or revoked sooner.       Influenza A by PCR 11/04/2023 NEGATIVE  NEGATIVE Final   Influenza B by PCR 11/04/2023 NEGATIVE  NEGATIVE Final   Comment: (NOTE) The Xpert Xpress SARS-CoV-2/FLU/RSV plus assay is intended as an aid in the diagnosis of influenza from Nasopharyngeal swab specimens and should not be used as a sole basis for treatment. Nasal washings and aspirates are unacceptable for Xpert Xpress SARS-CoV-2/FLU/RSV testing.  Fact Sheet for Patients: BloggerCourse.com  Fact Sheet for Healthcare Providers: SeriousBroker.it  This test is not yet approved or  cleared by the [place] FDA and has been authorized for detection and/or diagnosis of SARS-CoV-2 by FDA under an Emergency Use Authorization (EUA). This EUA will remain in effect (meaning this test can be used) for the duration of the COVID-19 declaration under Section 564(b)(1) of the Act, 21 U.S.C. section 360bbb-3(b)(1), unless the authorization is terminated or revoked.     Resp Syncytial Virus by PCR 11/04/2023 NEGATIVE  NEGATIVE Final   Comment: (NOTE) Fact Sheet for Patients: BloggerCourse.com  Fact Sheet for Healthcare Providers: SeriousBroker.it  This test is not yet approved or cleared by the [place] FDA and has been authorized for detection and/or diagnosis of SARS-CoV-2 by FDA under an Emergency Use Authorization (EUA). This EUA will remain in effect (meaning this test can be used)  for the duration of the COVID-19 declaration under Section 564(b)(1) of the Act, 21 U.S.C. section 360bbb-3(b)(1), unless the authorization is terminated or revoked.  Performed at [facility], [address] [place], [place] [address]    Glucose-Capillary 11/04/2023 171 (H)  70 - 99 mg/dL Final   Glucose reference range applies only to samples taken after fasting for at least 8 hours.  Admission on 06/28/2023, Discharged on 06/28/2023  Component Date Value Ref Range Status   Glucose-Capillary 06/28/2023 81  70 - 99 mg/dL Final   Glucose reference range applies only to samples taken after fasting for at least 8 hours.   Comment 1 06/28/2023 Notify RN   Final   WBC 06/28/2023 9.5  4.0 - 10.5 K/uL Final   RBC 06/28/2023 4.29  4.22 - 5.81 MIL/uL Final   Hemoglobin 06/28/2023 12.5 (L)  13.0 - 17.0 g/dL Final   HCT 69/48/5462 36.3 (L)  39.0 - 52.0 % Final   MCV 06/28/2023 84.6  80.0 - 100.0 fL Final   MCH 06/28/2023 29.1  26.0 - 34.0 pg Final   MCHC 06/28/2023 34.4  30.0 - 36.0 g/dL Final   RDW 70/35/0093 13.0   11.5 - 15.5 % Final   Platelets 06/28/2023 374  150 - 400 K/uL Final   nRBC 06/28/2023 0.0  0.0 - 0.2 % Final   Neutrophils Relative % 06/28/2023 70  % Final   Neutro Abs 06/28/2023 6.8  1.7 - 7.7 K/uL Final   Lymphocytes Relative 06/28/2023 20  % Final   Lymphs Abs 06/28/2023 1.9  0.7 - 4.0 K/uL Final   Monocytes Relative 06/28/2023 7  % Final   Monocytes Absolute 06/28/2023 0.6  0.1 - 1.0 K/uL Final   Eosinophils Relative 06/28/2023 1  % Final   Eosinophils Absolute 06/28/2023 0.1  0.0 - 0.5 K/uL Final   Basophils Relative 06/28/2023 1  % Final   Basophils Absolute 06/28/2023 0.1  0.0 - 0.1 K/uL Final   Immature Granulocytes 06/28/2023 1  % Final   Abs Immature Granulocytes 06/28/2023 0.05  0.00 - 0.07 K/uL Final   Performed at [facility], [address] [place], [place] [address]   Sodium 06/28/2023 135  135 - 145 mmol/L Final   Potassium 06/28/2023 4.5  3.5 - 5.1 mmol/L Final   Chloride 06/28/2023 100  98 - 111 mmol/L Final   CO2 06/28/2023 26  22 - 32 mmol/L Final   Glucose, Bld 06/28/2023 83  70 - 99 mg/dL Final   Glucose reference range applies only to samples taken after fasting for at least 8 hours.   BUN 06/28/2023 38 (H)  6 - 20 mg/dL Final   Creatinine, Ser 06/28/2023 1.11  0.61 - 1.24 mg/dL Final   Calcium 93/71/6967 9.2  8.9 - 10.3 mg/dL Final   Total Protein 89/38/1017 7.4  6.5 - 8.1 g/dL Final   Albumin 51/09/5850 4.0  3.5 - 5.0 g/dL Final   AST 77/82/4235 22  15 - 41 U/L Final   ALT 06/28/2023 20  0 - 44 U/L Final   Alkaline Phosphatase 06/28/2023 81  38 - 126 U/L Final   Total Bilirubin 06/28/2023 0.3  <1.2 mg/dL Final   GFR, Estimated 06/28/2023 >60  >60 mL/min Final   Comment: (NOTE) Calculated using the CKD-EPI Creatinine Equation (2021)    Anion gap 06/28/2023 9  5 - 15 Final   Performed at [facility], [address] [place], [place]  [address]    Blood Alcohol level:  Lab Results  Component Value Date   ETH <10 11/03/2023    ETH <10 09/17/2022    Metabolic Disorder Labs: Lab Results  Component Value Date   HGBA1C 8.7 (H) 11/07/2023   MPG 202.99 11/07/2023   MPG 217.34 09/17/2022   No results found for: "PROLACTIN" Lab Results  Component Value Date   CHOL 153 09/17/2022   TRIG 58 09/17/2022   HDL 71 09/17/2022   CHOLHDL 2.2 09/17/2022   VLDL 12 09/17/2022   LDLCALC 70 09/17/2022    Therapeutic Lab Levels: No results found for: "LITHIUM" No results found for: "VALPROATE" No results found for: "CBMZ"  Physical Findings   PHQ2-9    Flowsheet Row ED from 11/06/2023 in [facility]  PHQ-2 Total Score 2  PHQ-9 Total Score 9      Flowsheet Row ED from 11/06/2023 in [facility] ED from 11/03/2023 in [facility] Emergency Department at [facility] ED from 07/18/2023 in [facility] Emergency Department at [facility]  C-SSRS RISK CATEGORY No Risk No Risk No Risk        Psychiatric Specialty Exam: Physical Exam Constitutional:      Appearance: the patient is not toxic-appearing.  Pulmonary:     Effort: Pulmonary effort is normal.  Neurological:     General: No focal deficit present.     Mental Status: the patient is alert and oriented to person, place, and time.   Review of Systems  Respiratory:  Negative for shortness of breath.   Cardiovascular:  Negative for chest pain.  Gastrointestinal:  Negative for abdominal pain, constipation, diarrhea, nausea and vomiting.  Neurological:  Negative for headaches.      BP (!) 152/83 (BP Location: Right Arm)   Pulse 88   Temp 98.2 F (36.8 C) (Oral)   Resp 18   SpO2 98%   General Appearance: Fairly Groomed  Eye Contact:  Good  Speech:  Clear and Coherent  Volume:  Normal  Mood:  "fine"  Affect:  Congruent  Thought Process:  Coherent  Orientation:  Full (Time, Place, and Person)  Thought Content: Logical   Suicidal Thoughts:  No  Homicidal Thoughts:  No  Memory:   Immediate;   Good  Judgement:  fair  Insight:  fair  Psychomotor Activity:  Normal  Concentration:  Concentration: Good  Recall:  Good  Fund of Knowledge: Good  Language: Good  Akathisia:  No  Handed:  not assessed  AIMS (if indicated): not done  Assets:  Communication Skills Desire for Improvement Leisure Time Physical Health  ADL's:  Intact  Cognition: WNL  Sleep:  Fair    Treatment Plan Summary: Daily contact with patient to assess and evaluate symptoms and progress in treatment and Medication management.  Stimulant induced psychosis, improving - Patient has received Invega 6 mg today, he denies any side effect from [place] -Will consider Invega Sustenna 234 IM after repeating EKG in 1-2 days  - Recommend residential rehab.  Patient says he will consider this.  Patient has regular fills for Suboxone, will continue this medication.   Lewanda Rife, MD 11/07/2023 2:05 PM

## 2023-11-07 NOTE — ED Notes (Signed)
 Patient is sleeping. Respirations equal and unlabored, skin warm and dry. No change in assessment or acuity. Routine safety checks conducted according to facility protocol. Will continue to monitor for safety.

## 2023-11-07 NOTE — ED Notes (Signed)
 Pt is in his room resting in bed. Pt complained of having back pain, writer have noticed pt is always sleeping in bed, on his back. Writer encouraged pt to be coming out to take a walk and also change positions to avoid putting too much pressure on the her back. Pt denies SI/HI/AVH. No acute distress noted. Will continue to monitor for safety.

## 2023-11-07 NOTE — ED Notes (Signed)
 Patient is calm and quiet soft spoken and avoidant.  Patient without distress or complaint.  Will monitor.

## 2023-11-08 DIAGNOSIS — F1510 Other stimulant abuse, uncomplicated: Secondary | ICD-10-CM | POA: Diagnosis not present

## 2023-11-08 DIAGNOSIS — I4519 Other right bundle-branch block: Secondary | ICD-10-CM | POA: Diagnosis not present

## 2023-11-08 DIAGNOSIS — E119 Type 2 diabetes mellitus without complications: Secondary | ICD-10-CM | POA: Diagnosis not present

## 2023-11-08 LAB — GLUCOSE, CAPILLARY
Glucose-Capillary: 261 mg/dL — ABNORMAL HIGH (ref 70–99)
Glucose-Capillary: 234 mg/dL — ABNORMAL HIGH (ref 70–99)
Glucose-Capillary: 364 mg/dL — ABNORMAL HIGH (ref 70–99)

## 2023-11-08 MED ORDER — DOCUSATE SODIUM 100 MG PO CAPS
100.0000 mg | ORAL_CAPSULE | Freq: Two times a day (BID) | ORAL | Status: DC
  Administered 2023-11-08 – 2023-11-11 (×7): 100 mg via ORAL
  Filled 2023-11-08 (×7): qty 1

## 2023-11-08 MED ORDER — INSULIN ASPART 100 UNIT/ML IJ SOLN
0.0000 [IU] | Freq: Every day | INTRAMUSCULAR | Status: DC
  Administered 2023-11-08: 5 [IU] via SUBCUTANEOUS
  Administered 2023-11-09 – 2023-11-10 (×2): 4 [IU] via SUBCUTANEOUS

## 2023-11-08 MED ORDER — INSULIN ASPART 100 UNIT/ML IJ SOLN
0.0000 [IU] | Freq: Three times a day (TID) | INTRAMUSCULAR | Status: DC
  Administered 2023-11-09: 5 [IU] via SUBCUTANEOUS
  Administered 2023-11-09: 11 [IU] via SUBCUTANEOUS
  Administered 2023-11-09: 5 [IU] via SUBCUTANEOUS
  Administered 2023-11-10: 11 [IU] via SUBCUTANEOUS
  Administered 2023-11-10: 5 [IU] via SUBCUTANEOUS
  Administered 2023-11-10: 8 [IU] via SUBCUTANEOUS
  Administered 2023-11-11: 5 [IU] via SUBCUTANEOUS

## 2023-11-08 NOTE — Discharge Planning (Signed)
 LCSW informed that patient expressed interest in residential placement at discharge. Referrals have been sent to [facility] and [facility] for review. Updates will be provided once received.   Fernande Boyden, LCSW Clinical Social Worker [facility] Ph: [phone]

## 2023-11-08 NOTE — Group Note (Signed)
 Group Topic: Healthy Self Image and Positive Change  Group Date: 11/08/2023 Start Time: 2000 End Time: 2200 Facilitators: Rae Lips B  Department: [facility]  Number of Participants: 6  Group Focus: abuse issues, acceptance, activities of daily living skills, anxiety, check in, chemical dependency issues, clarity of thought, communication, coping skills, daily focus, depression, family, feeling awareness/expression, forgiveness, goals/reality orientation, healthy friendships, and substance abuse education Treatment Modality:  Leisure Development Interventions utilized were group exercise, leisure development, patient education, story telling, and support Purpose: enhance coping skills, express feelings, increase insight, regain self-worth, reinforce self-care, and relapse prevention strategies  Name: Tanner [name] Date of Birth: Nov 03, 1965  MR: [id number]    Level of Participation: Pt. Did not participate in group Quality of Participation: None Interactions with others: gave feedback Mood/Affect: appropriate and positive Triggers (if applicable): NA Cognition: Pt. was in room talking to himself Progress: Minimal Response: NA Plan: patient will be encouraged to participate in group  Patients Problems:  Patient Active Problem List   Diagnosis Date Noted   Paranoia (psychosis) (HCC) 11/06/2023   Generalized anxiety disorder 11/03/2023   Paranoia (HCC) 11/03/2023   Stimulant-induced psychotic disorder with delusions (HCC) 11/03/2023   Delusion (HCC) 02/10/2022   Acute low back pain    Osteomyelitis (HCC)    Paraspinal abscess (HCC)    IVDU (intravenous drug user) 02/02/2022   Osteomyelitis of lumbar spine (HCC) 02/02/2022   Chronic pain 02/02/2022   Insomnia 02/02/2022   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   Constipation 02/02/2022   Infective endocarditis of tricuspid valve 02/02/2022   MSSA bacteremia 01/26/2022   Septic arthritis  of lumbar spine (HCC) 01/17/2022   Controlled type 2 diabetes mellitus without complication, without long-term current use of insulin (HCC) 01/17/2022   Hypocalcemia 01/17/2022   Normocytic anemia 01/17/2022   Hyponatremia 01/17/2022   Hypomagnesemia 01/17/2022   Cellulitis of right foot 05/17/2019   Tobacco abuse    Opioid dependence, uncomplicated (HCC) 02/24/2018   Periodontal disease 10/11/2017   Hepatitis C antibody positive in blood 10/05/2017   Elevated platelet count 10/05/2017   History of substance abuse (HCC) 09/20/2017   History of vitamin D deficiency 09/20/2017   Essential hypertension 09/20/2017

## 2023-11-08 NOTE — Group Note (Signed)
 Group Topic: Recovery Basics  Group Date: 11/08/2023 Start Time: 1445 End Time: 1510 Facilitators: Ryken Paschal, Hayes Ludwig, RN  Department: [facility]  Number of Participants: 9  Group Focus: leisure skills Treatment Modality:  Leisure Development Interventions utilized were mental fitness and patient education Purpose: increase insight  Name: Tanner [name] Date of Birth: 08-Mar-1966  MR: [id number]    Level of Participation: did not attend Quality of Participation: did not attend Interactions with others: did not attend Mood/Affect: did not attend Triggers (if applicable): na Cognition: did not attend Progress: did not attend Response: did not attend Plan: did not attend  Patients Problems:  Patient Active Problem List   Diagnosis Date Noted   Paranoia (psychosis) (HCC) 11/06/2023   Generalized anxiety disorder 11/03/2023   Paranoia (HCC) 11/03/2023   Stimulant-induced psychotic disorder with delusions (HCC) 11/03/2023   Delusion (HCC) 02/10/2022   Acute low back pain    Osteomyelitis (HCC)    Paraspinal abscess (HCC)    IVDU (intravenous drug user) 02/02/2022   Osteomyelitis of lumbar spine (HCC) 02/02/2022   Chronic pain 02/02/2022   Insomnia 02/02/2022   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   Constipation 02/02/2022   Infective endocarditis of tricuspid valve 02/02/2022   MSSA bacteremia 01/26/2022   Septic arthritis of lumbar spine (HCC) 01/17/2022   Controlled type 2 diabetes mellitus without complication, without long-term current use of insulin (HCC) 01/17/2022   Hypocalcemia 01/17/2022   Normocytic anemia 01/17/2022   Hyponatremia 01/17/2022   Hypomagnesemia 01/17/2022   Cellulitis of right foot 05/17/2019   Tobacco abuse    Opioid dependence, uncomplicated (HCC) 02/24/2018   Periodontal disease 10/11/2017   Hepatitis C antibody positive in blood 10/05/2017   Elevated platelet count 10/05/2017   History of substance abuse  (HCC) 09/20/2017   History of vitamin D deficiency 09/20/2017   Essential hypertension 09/20/2017

## 2023-11-08 NOTE — Group Note (Signed)
 Group Topic: Positive Affirmations  Group Date: 11/08/2023 Start Time: 1500 End Time: 1600 Facilitators: Cassandria Anger  Department: [facility]  Number of Participants: 4  Group Focus: affirmation Treatment Modality:  Psychoeducation Interventions utilized were patient education Purpose: increase insight  Name: Tanner [name] Date of Birth: [birth year]  MR: [id number]    Level of Participation: moderate Quality of Participation: attentive, engaged, and offered feedback Interactions with others: gave feedback Mood/Affect: appropriate and positive Triggers (if applicable): N/A Cognition: coherent/clear, concrete, and insightful Progress: Gaining insight Response: Patient expressed his feelings over how relapsing is not the end of the world. People may put you down over it, but it does not matter what they say. You must focus on yourself and not put yourself down over it. Keep striving forward Plan: patient will be encouraged to continue to attend group  Patients Problems:  Patient Active Problem List   Diagnosis Date Noted   Paranoia (psychosis) (HCC) 11/06/2023   Generalized anxiety disorder 11/03/2023   Paranoia (HCC) 11/03/2023   Stimulant-induced psychotic disorder with delusions (HCC) 11/03/2023   Delusion (HCC) 02/10/2022   Acute low back pain    Osteomyelitis (HCC)    Paraspinal abscess (HCC)    IVDU (intravenous drug user) 02/02/2022   Osteomyelitis of lumbar spine (HCC) 02/02/2022   Chronic pain 02/02/2022   Insomnia 02/02/2022   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   Constipation 02/02/2022   Infective endocarditis of tricuspid valve 02/02/2022   MSSA bacteremia 01/26/2022   Septic arthritis of lumbar spine (HCC) 01/17/2022   Controlled type 2 diabetes mellitus without complication, without long-term current use of insulin (HCC) 01/17/2022   Hypocalcemia 01/17/2022   Normocytic anemia 01/17/2022   Hyponatremia  01/17/2022   Hypomagnesemia 01/17/2022   Cellulitis of right foot 05/17/2019   Tobacco abuse    Opioid dependence, uncomplicated (HCC) 02/24/2018   Periodontal disease 10/11/2017   Hepatitis C antibody positive in blood 10/05/2017   Elevated platelet count 10/05/2017   History of substance abuse (HCC) 09/20/2017   History of vitamin D deficiency 09/20/2017   Essential hypertension 09/20/2017

## 2023-11-08 NOTE — ED Notes (Signed)
 Patient was provided lunch

## 2023-11-08 NOTE — ED Provider Notes (Signed)
 Behavioral Health Progress Note  Date and Time: 11/08/2023 1:43 PM Name: Tanner [name] MRN:  [id number]   he patient is a 58 year old male with a history of methamphetamine and marijuana abuse.  Over the past year he has been admitted to [facility] on 2 occasions, both of which appear to be related to stimulant induced psychosis.  At the last hospitalization (in November 2024), the patient received Gean Birchwood.  On the present occasion, 11/03/2023, the patient presented to [facility] long ED requesting an [place] shot and was noted to be experiencing psychotic symptoms.  He was transferred to the facility based crisis for further treatment.  Subjective:    Chart reviewed, case discussed with staff, patient seen today during rounds.  On assessment patient endorsed" fine" mood.  Patient continues to maintain the fact that he came to the ER to get Hinda Glatter sustain a long-acting injection, which according to patient he received last in November 2024.  Patient was informed about changes in his EKG.  He was informed that EKG will be repeated tomorrow to determine administration of long-acting [place].  Today patient denies auditory or visual hallucinations today.  He denies thoughts of harming himself or others.  Patient was encouraged to abstain from drug use.  Patient is working with Child psychotherapist to get a bed at [facility]. Chart review shows that patient was admitted to [facility] in November. Per discharge summary- 11/9: Patient agreed to Gean Birchwood, first dose 11/9 234 mg, and second dose on 11/13 156 mg prior to discharge   Diagnosis:  Final diagnoses:  Methamphetamine abuse (HCC)    Total Time spent with patient: 15 minutes  Past Psychiatric History: See H&P and above Past Medical History: See H&P and above Family History: See H&P and above Family Psychiatric  History: See H&P and above Social History: See H&P and above  Additional Social History:   none  Sleep: fair  Appetite:  fair  Current Medications:  Current Facility-Administered Medications  Medication Dose Route Frequency Provider Last Rate Last Admin   acetaminophen (TYLENOL) tablet 650 mg  650 mg Oral Q6H PRN Dixon, Rashaun M, NP       alum & mag hydroxide-simeth (MAALOX/MYLANTA) 200-200-20 MG/5ML suspension 30 mL  30 mL Oral Q4H PRN Durwin Nora, Rashaun M, NP       buprenorphine-naloxone (SUBOXONE) 8-2 mg per SL tablet 1 tablet  1 tablet Sublingual BID Jearld Lesch, NP   1 tablet at 11/08/23 4098   haloperidol (HALDOL) tablet 5 mg  5 mg Oral TID PRN Jearld Lesch, NP       And   diphenhydrAMINE (BENADRYL) capsule 50 mg  50 mg Oral TID PRN Jearld Lesch, NP       haloperidol lactate (HALDOL) injection 5 mg  5 mg Intramuscular TID PRN Jearld Lesch, NP       And   diphenhydrAMINE (BENADRYL) injection 50 mg  50 mg Intramuscular TID PRN Jearld Lesch, NP       And   LORazepam (ATIVAN) injection 2 mg  2 mg Intramuscular TID PRN Jearld Lesch, NP       haloperidol lactate (HALDOL) injection 10 mg  10 mg Intramuscular TID PRN Jearld Lesch, NP       And   diphenhydrAMINE (BENADRYL) injection 50 mg  50 mg Intramuscular TID PRN Jearld Lesch, NP       And   LORazepam (ATIVAN) injection 2 mg  2 mg Intramuscular TID PRN Jearld Lesch, NP       docusate sodium (COLACE) capsule 100 mg  100 mg Oral BID Lewanda Rife, MD   100 mg at 11/08/23 1050   hydrOXYzine (ATARAX) tablet 25 mg  25 mg Oral TID PRN Jearld Lesch, NP   25 mg at 11/06/23 0329   magnesium hydroxide (MILK OF MAGNESIA) suspension 30 mL  30 mL Oral Daily PRN Jearld Lesch, NP       paliperidone (INVEGA) 24 hr tablet 6 mg  6 mg Oral Daily Lewanda Rife, MD   6 mg at 11/08/23 5621   traZODone (DESYREL) tablet 50 mg  50 mg Oral QHS PRN Jearld Lesch, NP   50 mg at 11/07/23 2123   Current Outpatient Medications  Medication Sig Dispense Refill   ARIPiprazole (ABILIFY) 5 MG tablet  Take 5 mg by mouth at bedtime.     buprenorphine-naloxone (SUBOXONE) 8-2 mg SUBL SL tablet Place 1 tablet under the tongue 2 (two) times daily.      Labs  Lab Results:  Admission on 11/06/2023  Component Date Value Ref Range Status   Hgb A1c MFr Bld 11/07/2023 8.7 (H)  4.8 - 5.6 % Final   Comment: (NOTE) Pre diabetes:          5.7%-6.4%  Diabetes:              >6.4%  Glycemic control for   <7.0% adults with diabetes    Mean Plasma Glucose 11/07/2023 202.99  mg/dL Final   Performed at [facility], [address], [place], [place] [address]   Sodium 11/07/2023 132 (L)  135 - 145 mmol/L Final   Potassium 11/07/2023 4.6  3.5 - 5.1 mmol/L Final   Chloride 11/07/2023 95 (L)  98 - 111 mmol/L Final   CO2 11/07/2023 26  22 - 32 mmol/L Final   Glucose, Bld 11/07/2023 251 (H)  70 - 99 mg/dL Final   Glucose reference range applies only to samples taken after fasting for at least 8 hours.   BUN 11/07/2023 22 (H)  6 - 20 mg/dL Final   Creatinine, Ser 11/07/2023 0.99  0.61 - 1.24 mg/dL Final   Calcium 78/46/9629 9.0  8.9 - 10.3 mg/dL Final   GFR, Estimated 11/07/2023 >60  >60 mL/min Final   Comment: (NOTE) Calculated using the CKD-EPI Creatinine Equation (2021)    Anion gap 11/07/2023 11  5 - 15 Final   Performed at [facility], [address], [place], [place] [address]   Glucose-Capillary 11/07/2023 265 (H)  70 - 99 mg/dL Final   Glucose reference range applies only to samples taken after fasting for at least 8 hours.   Glucose-Capillary 11/06/2023 228 (H)  70 - 99 mg/dL Final   Glucose reference range applies only to samples taken after fasting for at least 8 hours.   Glucose-Capillary 11/07/2023 256 (H)  70 - 99 mg/dL Final   Glucose reference range applies only to samples taken after fasting for at least 8 hours.   Glucose-Capillary 11/08/2023 261 (H)  70 - 99 mg/dL Final   Glucose reference range applies only to samples taken after fasting for at least 8 hours.    Glucose-Capillary 11/08/2023 234 (H)  70 - 99 mg/dL Final   Glucose reference range applies only to samples taken after fasting for at least 8 hours.  Admission on 11/03/2023, Discharged on 11/06/2023  Component Date Value Ref Range Status  Sodium 11/03/2023 131 (L)  135 - 145 mmol/L Final   Potassium 11/03/2023 4.0  3.5 - 5.1 mmol/L Final   Chloride 11/03/2023 97 (L)  98 - 111 mmol/L Final   CO2 11/03/2023 26  22 - 32 mmol/L Final   Glucose, Bld 11/03/2023 357 (H)  70 - 99 mg/dL Final   Glucose reference range applies only to samples taken after fasting for at least 8 hours.   BUN 11/03/2023 21 (H)  6 - 20 mg/dL Final   Creatinine, Ser 11/03/2023 0.78  0.61 - 1.24 mg/dL Final   Calcium 16/05/9603 9.1  8.9 - 10.3 mg/dL Final   Total Protein 54/04/8118 7.0  6.5 - 8.1 g/dL Final   Albumin 14/78/2956 4.0  3.5 - 5.0 g/dL Final   AST 21/30/8657 31  15 - 41 U/L Final   ALT 11/03/2023 33  0 - 44 U/L Final   Alkaline Phosphatase 11/03/2023 98  38 - 126 U/L Final   Total Bilirubin 11/03/2023 0.5  0.0 - 1.2 mg/dL Final   GFR, Estimated 11/03/2023 >60  >60 mL/min Final   Comment: (NOTE) Calculated using the CKD-EPI Creatinine Equation (2021)    Anion gap 11/03/2023 8  5 - 15 Final   Performed at [facility], [address], [place], [place] [address]   Alcohol, Ethyl (B) 11/03/2023 <10  <10 mg/dL Final   Comment: (NOTE) Lowest detectable limit for serum alcohol is 10 mg/dL.  For medical purposes only. Performed at [facility], [address], [place], [place] [address]    Opiates 11/03/2023 NONE DETECTED  NONE DETECTED Final   Cocaine 11/03/2023 NONE DETECTED  NONE DETECTED Final   Benzodiazepines 11/03/2023 NONE DETECTED  NONE DETECTED Final   Amphetamines 11/03/2023 POSITIVE (A)  NONE DETECTED Final   Comment: (NOTE) Trazodone is metabolized in vivo to several metabolites, including pharmacologically active m-CPP, which is excreted in the  urine. Immunoassay screens for amphetamines and MDMA have potential cross-reactivity with these compounds and may provide false positive  results.     Tetrahydrocannabinol 11/03/2023 POSITIVE (A)  NONE DETECTED Final   Barbiturates 11/03/2023 NONE DETECTED  NONE DETECTED Final   Comment: (NOTE) DRUG SCREEN FOR MEDICAL PURPOSES ONLY.  IF CONFIRMATION IS NEEDED FOR ANY PURPOSE, NOTIFY LAB WITHIN 5 DAYS.  LOWEST DETECTABLE LIMITS FOR URINE DRUG SCREEN Drug Class                     Cutoff (ng/mL) Amphetamine and metabolites    1000 Barbiturate and metabolites    200 Benzodiazepine                 200 Opiates and metabolites        300 Cocaine and metabolites        300 THC                            50 Performed at [facility], [address] [place], [place] [address]    WBC 11/03/2023 4.6  4.0 - 10.5 K/uL Final   RBC 11/03/2023 4.37  4.22 - 5.81 MIL/uL Final   Hemoglobin 11/03/2023 12.2 (L)  13.0 - 17.0 g/dL Final   HCT 40/05/2724 35.9 (L)  39.0 - 52.0 % Final   MCV 11/03/2023 82.2  80.0 - 100.0 fL Final   MCH 11/03/2023 27.9  26.0 - 34.0 pg Final   MCHC 11/03/2023 34.0  30.0 -  36.0 g/dL Final   RDW 16/05/9603 12.9  11.5 - 15.5 % Final   Platelets 11/03/2023 288  150 - 400 K/uL Final   nRBC 11/03/2023 0.0  0.0 - 0.2 % Final   Neutrophils Relative % 11/03/2023 66  % Final   Neutro Abs 11/03/2023 3.0  1.7 - 7.7 K/uL Final   Lymphocytes Relative 11/03/2023 22  % Final   Lymphs Abs 11/03/2023 1.0  0.7 - 4.0 K/uL Final   Monocytes Relative 11/03/2023 9  % Final   Monocytes Absolute 11/03/2023 0.4  0.1 - 1.0 K/uL Final   Eosinophils Relative 11/03/2023 2  % Final   Eosinophils Absolute 11/03/2023 0.1  0.0 - 0.5 K/uL Final   Basophils Relative 11/03/2023 1  % Final   Basophils Absolute 11/03/2023 0.0  0.0 - 0.1 K/uL Final   Immature Granulocytes 11/03/2023 0  % Final   Abs Immature Granulocytes 11/03/2023 0.01  0.00 - 0.07 K/uL Final   Performed at [facility], [address] [place], [place] [address]   Glucose-Capillary 11/03/2023 180 (H)  70 - 99 mg/dL Final   Glucose reference range applies only to samples taken after fasting for at least 8 hours.   SARS Coronavirus 2 by RT PCR 11/04/2023 NEGATIVE  NEGATIVE Final   Comment: (NOTE) SARS-CoV-2 target nucleic acids are NOT DETECTED.  The SARS-CoV-2 RNA is generally detectable in upper respiratory specimens during the acute phase of infection. The lowest concentration of SARS-CoV-2 viral copies this assay can detect is 138 copies/mL. A negative result does not preclude SARS-Cov-2 infection and should not be used as the sole basis for treatment or other patient management decisions. A negative result may occur with  improper specimen collection/handling, submission of specimen other than nasopharyngeal swab, presence of viral mutation(s) within the areas targeted by this assay, and inadequate number of viral copies(<138 copies/mL). A negative result must be combined with clinical observations, patient history, and epidemiological information. The expected result is Negative.  Fact Sheet for Patients:  BloggerCourse.com  Fact Sheet for Healthcare Providers:  SeriousBroker.it  This test is no                          t yet approved or cleared by the [place] FDA and  has been authorized for detection and/or diagnosis of SARS-CoV-2 by FDA under an Emergency Use Authorization (EUA). This EUA will remain  in effect (meaning this test can be used) for the duration of the COVID-19 declaration under Section 564(b)(1) of the Act, 21 U.S.C.section 360bbb-3(b)(1), unless the authorization is terminated  or revoked sooner.       Influenza A by PCR 11/04/2023 NEGATIVE  NEGATIVE Final   Influenza B by PCR 11/04/2023 NEGATIVE  NEGATIVE Final   Comment: (NOTE) The Xpert Xpress SARS-CoV-2/FLU/RSV plus assay is intended as  an aid in the diagnosis of influenza from Nasopharyngeal swab specimens and should not be used as a sole basis for treatment. Nasal washings and aspirates are unacceptable for Xpert Xpress SARS-CoV-2/FLU/RSV testing.  Fact Sheet for Patients: BloggerCourse.com  Fact Sheet for Healthcare Providers: SeriousBroker.it  This test is not yet approved or cleared by the [place] FDA and has been authorized for detection and/or diagnosis of SARS-CoV-2 by FDA under an Emergency Use Authorization (EUA). This EUA will remain in effect (meaning this test can be used) for the duration of the COVID-19 declaration under Section 564(b)(1) of the Act, 21 U.S.C. section  360bbb-3(b)(1), unless the authorization is terminated or revoked.     Resp Syncytial Virus by PCR 11/04/2023 NEGATIVE  NEGATIVE Final   Comment: (NOTE) Fact Sheet for Patients: BloggerCourse.com  Fact Sheet for Healthcare Providers: SeriousBroker.it  This test is not yet approved or cleared by the [place] FDA and has been authorized for detection and/or diagnosis of SARS-CoV-2 by FDA under an Emergency Use Authorization (EUA). This EUA will remain in effect (meaning this test can be used) for the duration of the COVID-19 declaration under Section 564(b)(1) of the Act, 21 U.S.C. section 360bbb-3(b)(1), unless the authorization is terminated or revoked.  Performed at [facility], [address] [place], [place] [address]    Glucose-Capillary 11/04/2023 171 (H)  70 - 99 mg/dL Final   Glucose reference range applies only to samples taken after fasting for at least 8 hours.  Admission on 06/28/2023, Discharged on 06/28/2023  Component Date Value Ref Range Status   Glucose-Capillary 06/28/2023 81  70 - 99 mg/dL Final   Glucose reference range applies only to samples taken after fasting for at least 8  hours.   Comment 1 06/28/2023 Notify RN   Final   WBC 06/28/2023 9.5  4.0 - 10.5 K/uL Final   RBC 06/28/2023 4.29  4.22 - 5.81 MIL/uL Final   Hemoglobin 06/28/2023 12.5 (L)  13.0 - 17.0 g/dL Final   HCT 27/25/3664 36.3 (L)  39.0 - 52.0 % Final   MCV 06/28/2023 84.6  80.0 - 100.0 fL Final   MCH 06/28/2023 29.1  26.0 - 34.0 pg Final   MCHC 06/28/2023 34.4  30.0 - 36.0 g/dL Final   RDW 40/34/7425 13.0  11.5 - 15.5 % Final   Platelets 06/28/2023 374  150 - 400 K/uL Final   nRBC 06/28/2023 0.0  0.0 - 0.2 % Final   Neutrophils Relative % 06/28/2023 70  % Final   Neutro Abs 06/28/2023 6.8  1.7 - 7.7 K/uL Final   Lymphocytes Relative 06/28/2023 20  % Final   Lymphs Abs 06/28/2023 1.9  0.7 - 4.0 K/uL Final   Monocytes Relative 06/28/2023 7  % Final   Monocytes Absolute 06/28/2023 0.6  0.1 - 1.0 K/uL Final   Eosinophils Relative 06/28/2023 1  % Final   Eosinophils Absolute 06/28/2023 0.1  0.0 - 0.5 K/uL Final   Basophils Relative 06/28/2023 1  % Final   Basophils Absolute 06/28/2023 0.1  0.0 - 0.1 K/uL Final   Immature Granulocytes 06/28/2023 1  % Final   Abs Immature Granulocytes 06/28/2023 0.05  0.00 - 0.07 K/uL Final   Performed at [facility], [address] [place], [place] [address]   Sodium 06/28/2023 135  135 - 145 mmol/L Final   Potassium 06/28/2023 4.5  3.5 - 5.1 mmol/L Final   Chloride 06/28/2023 100  98 - 111 mmol/L Final   CO2 06/28/2023 26  22 - 32 mmol/L Final   Glucose, Bld 06/28/2023 83  70 - 99 mg/dL Final   Glucose reference range applies only to samples taken after fasting for at least 8 hours.   BUN 06/28/2023 38 (H)  6 - 20 mg/dL Final   Creatinine, Ser 06/28/2023 1.11  0.61 - 1.24 mg/dL Final   Calcium 75/64/3329 9.2  8.9 - 10.3 mg/dL Final   Total Protein 51/88/4166 7.4  6.5 - 8.1 g/dL Final   Albumin 02/13/1600 4.0  3.5 - 5.0 g/dL Final   AST 09/32/3557 22  15 - 41 U/L  Final   ALT 06/28/2023 20  0 - 44 U/L Final   Alkaline Phosphatase 06/28/2023 81   38 - 126 U/L Final   Total Bilirubin 06/28/2023 0.3  <1.2 mg/dL Final   GFR, Estimated 06/28/2023 >60  >60 mL/min Final   Comment: (NOTE) Calculated using the CKD-EPI Creatinine Equation (2021)    Anion gap 06/28/2023 9  5 - 15 Final   Performed at [facility], [address] [place], [place] [address]    Blood Alcohol level:  Lab Results  Component Value Date   [facility] <10 11/03/2023   ETH <10 09/17/2022    Metabolic Disorder Labs: Lab Results  Component Value Date   HGBA1C 8.7 (H) 11/07/2023   MPG 202.99 11/07/2023   MPG 217.34 09/17/2022   No results found for: "PROLACTIN" Lab Results  Component Value Date   CHOL 153 09/17/2022   TRIG 58 09/17/2022   HDL 71 09/17/2022   CHOLHDL 2.2 09/17/2022   VLDL 12 09/17/2022   LDLCALC 70 09/17/2022    Therapeutic Lab Levels: No results found for: "LITHIUM" No results found for: "VALPROATE" No results found for: "CBMZ"  Physical Findings   PHQ2-9    Flowsheet Row ED from 11/06/2023 in [facility]  PHQ-2 Total Score 2  PHQ-9 Total Score 9      Flowsheet Row ED from 11/06/2023 in [facility] ED from 11/03/2023 in [facility] Department at [facility] from 07/18/2023 in [facility] Emergency Department at [facility]  C-SSRS RISK CATEGORY No Risk No Risk No Risk        Psychiatric Specialty Exam: Physical Exam Constitutional:      Appearance: the patient is not toxic-appearing.  Pulmonary:     Effort: Pulmonary effort is normal.  Neurological:     General: No focal deficit present.     Mental Status: the patient is alert and oriented to person, place, and time.   Review of Systems  Respiratory:  Negative for shortness of breath.   Cardiovascular:  Negative for chest pain.  Gastrointestinal:  Negative for abdominal pain, constipation, diarrhea, nausea and vomiting.  Neurological:  Negative for headaches.      BP (!)  148/78 (BP Location: Left Arm)   Pulse 71   Temp 98.7 F (37.1 C) (Oral)   Resp 18   SpO2 97%   General Appearance: Fairly Groomed  Eye Contact:  Good  Speech:  Clear and Coherent  Volume:  Normal  Mood:  "fine"  Affect:  Congruent  Thought Process:  Coherent  Orientation:  Full (Time, Place, and Person)  Thought Content: Logical   Suicidal Thoughts:  No  Homicidal Thoughts:  No  Memory:  Immediate;   Good  Judgement:  fair  Insight:  fair  Psychomotor Activity:  Normal  Concentration:  Concentration: Good  Recall:  Good  Fund of Knowledge: Good  Language: Good  Akathisia:  No  Handed:  not assessed  AIMS (if indicated): not done  Assets:  Communication Skills Desire for Improvement Leisure Time Physical Health  ADL's:  Intact  Cognition: WNL  Sleep:  Fair    Treatment Plan Summary: Daily contact with patient to assess and evaluate symptoms and progress in treatment and Medication management.  Stimulant induced psychosis, improving - Patient has received Invega 6 mg today, he denies any side effect from [place] -Will consider Invega Sustenna 234 IM after repeating EKG tomorrow  -  Recommend residential rehab.  Patient says he will consider this.  Patient has regular fills for Suboxone, will continue this medication.   Lewanda Rife, MD 11/08/2023 1:43 PM

## 2023-11-08 NOTE — ED Notes (Signed)
 Pt CBG is 364. Pt is confused and keeps talking to himself. Pt is hallucinating, he reports seeing two spirits; a boy and a girl in male clothes talking to themselves even though he can not hear what they are saying. According to pt, these spirits sings and dance as well. NP notified. Orders put in.

## 2023-11-08 NOTE — ED Notes (Signed)
 Patient on phone in hallway, calm and composed. No acute distress noted. No concerns voiced. Informed patient to notify staff with any needs or assistance. Patient verbalized understanding or agreement. Safety checks in place per facility policy.

## 2023-11-08 NOTE — ED Notes (Signed)
 Patient sitting in dayroom interacting with peers. No acute distress noted. No concerns voiced. Informed patient to notify staff with any needs or assistance. Patient verbalized understanding or agreement. Safety checks in place per facility policy.

## 2023-11-08 NOTE — Group Note (Signed)
 Group Topic: Communication  Group Date: 11/07/2023 Start Time: 2000 End Time: 2030 Facilitators: Rae Lips B  Department: [facility]  Number of Participants: 2  Group Focus: abuse issues, acceptance, check in, chemical dependency issues, communication, coping skills, daily focus, depression, social skills, and substance abuse education Treatment Modality:  Individual Therapy Interventions utilized were leisure development Purpose: enhance coping skills, express feelings, increase insight, regain self-worth, and relapse prevention strategies  Name: Tanner [name] Date of Birth: 14-Mar-1966  MR: [id number]    Level of Participation: PT did not attend group Quality of Participation: cooperative Interactions with others: gave feedback Mood/Affect: appropriate Triggers (if applicable): NA Cognition: coherent/clear Progress: None Response: NA Plan: patient will be encouraged to go to groups.  Patients Problems:  Patient Active Problem List   Diagnosis Date Noted   Paranoia (psychosis) (HCC) 11/06/2023   Generalized anxiety disorder 11/03/2023   Paranoia (HCC) 11/03/2023   Stimulant-induced psychotic disorder with delusions (HCC) 11/03/2023   Delusion (HCC) 02/10/2022   Acute low back pain    Osteomyelitis (HCC)    Paraspinal abscess (HCC)    IVDU (intravenous drug user) 02/02/2022   Osteomyelitis of lumbar spine (HCC) 02/02/2022   Chronic pain 02/02/2022   Insomnia 02/02/2022   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   Constipation 02/02/2022   Infective endocarditis of tricuspid valve 02/02/2022   MSSA bacteremia 01/26/2022   Septic arthritis of lumbar spine (HCC) 01/17/2022   Controlled type 2 diabetes mellitus without complication, without long-term current use of insulin (HCC) 01/17/2022   Hypocalcemia 01/17/2022   Normocytic anemia 01/17/2022   Hyponatremia 01/17/2022   Hypomagnesemia 01/17/2022   Cellulitis of right foot  05/17/2019   Tobacco abuse    Opioid dependence, uncomplicated (HCC) 02/24/2018   Periodontal disease 10/11/2017   Hepatitis C antibody positive in blood 10/05/2017   Elevated platelet count 10/05/2017   History of substance abuse (HCC) 09/20/2017   History of vitamin D deficiency 09/20/2017   Essential hypertension 09/20/2017

## 2023-11-08 NOTE — ED Notes (Signed)
 Patient sitting in bedroom calm and composed. No acute distress noted. No concerns voiced. Informed patient to notify staff with any needs or assistance. Patient verbalized understanding or agreement. Safety checks in place per facility policy.

## 2023-11-08 NOTE — ED Notes (Signed)
 Pt is in his room resting in bed. Pt denies SI/HI/AVH. No acute distress noted. Will continue to monitor for safety.

## 2023-11-08 NOTE — ED Notes (Signed)
 Patient is sleeping. Respirations equal and unlabored, skin warm and dry. No change in assessment or acuity. Routine safety checks conducted according to facility protocol. Will continue to monitor for safety.

## 2023-11-08 NOTE — ED Notes (Signed)
 According to M[facility] Jeralyn Bennett, it was brought to his attention from [facility] Autumn that the Tanner [name] was talking to himself while in the dayroom eating. Writer will go to Tanner [name] room to observe this and assess Tanner [name].

## 2023-11-08 NOTE — ED Provider Notes (Signed)
 Pt A1C is 8.7.  his GBG check at 2131 was 364.  Writer start pt on SSI with insulin coverage.

## 2023-11-08 NOTE — ED Notes (Signed)
 Patient resting with eyes closed in no apparent acute distress. Respirations even and unlabored. Environment secured. Safety checks in place according to facility policy.

## 2023-11-08 NOTE — ED Notes (Signed)
 Patient alert & oriented x4. Denies intent to harm self or others when asked. Denies A/VH. Patient reports pain in shoulder rating 10/10. Patient states the pain is from sleeping on his shoulder, denies need for pain interventions at this time. Patient also reports constipation - unsure of date of last bowel movement. Patient offered Milk of Mag, patient refused stating only medications that would help is an enema or a suppository. Patient informed we likely do not offer those options, patient stated they could do it themselves. Patient informed we still would likely not be able to provide this medication. Provider Marval Regal, MD made aware, colace ordered. Scheduled medications administered with no complications. No acute distress noted. Support and encouragement provided. Routine safety checks conducted per facility protocol. Encouraged patient to notify staff if any thoughts of harm towards self or others arise. Patient verbalizes understanding and agreement.

## 2023-11-09 DIAGNOSIS — E119 Type 2 diabetes mellitus without complications: Secondary | ICD-10-CM | POA: Diagnosis not present

## 2023-11-09 DIAGNOSIS — I4519 Other right bundle-branch block: Secondary | ICD-10-CM | POA: Diagnosis not present

## 2023-11-09 DIAGNOSIS — F1510 Other stimulant abuse, uncomplicated: Secondary | ICD-10-CM | POA: Diagnosis not present

## 2023-11-09 LAB — GLUCOSE, CAPILLARY
Glucose-Capillary: 238 mg/dL — ABNORMAL HIGH (ref 70–99)
Glucose-Capillary: 303 mg/dL — ABNORMAL HIGH (ref 70–99)
Glucose-Capillary: 221 mg/dL — ABNORMAL HIGH (ref 70–99)
Glucose-Capillary: 327 mg/dL — ABNORMAL HIGH (ref 70–99)

## 2023-11-09 MED ORDER — NICOTINE POLACRILEX 2 MG MT GUM
2.0000 mg | CHEWING_GUM | OROMUCOSAL | Status: DC | PRN
  Administered 2023-11-09 – 2023-11-11 (×6): 2 mg via ORAL
  Filled 2023-11-09 (×7): qty 1

## 2023-11-09 NOTE — ED Notes (Signed)
 Pt approached nurses station stating he need medication for agitation. Pt received Haldol and Benadryl for agitation. Pt took reading book to room. Will continue to monitor for safety.

## 2023-11-09 NOTE — Group Note (Signed)
 Group Topic: Substance Abuse Treatment  Group Date: 11/09/2023 Start Time: 0200 End Time: 0300 Facilitators: Loleta Dicker, LCSW  Department: [facility]  Number of Participants: 6  Group Focus: chemical dependency education and chemical dependency issues Treatment Modality:  Cognitive Behavioral Therapy Interventions utilized were problem solving and support Purpose: enhance coping skills, relapse prevention strategies, and trigger / craving management  Name: Tanner [name] Date of Birth: July 14, 1966  MR: [id number]    Level of Participation: active Quality of Participation: attentive and cooperative Response: LCSW group was scheduled to be at this time, however AA representative presented for group. Patient participated in AA meeting on today and there were no issues to report.  Plan: referral / recommendations  Patients Problems:  Patient Active Problem List   Diagnosis Date Noted   Paranoia (psychosis) (HCC) 11/06/2023   Generalized anxiety disorder 11/03/2023   Paranoia (HCC) 11/03/2023   Stimulant-induced psychotic disorder with delusions (HCC) 11/03/2023   Delusion (HCC) 02/10/2022   Acute low back pain    Osteomyelitis (HCC)    Paraspinal abscess (HCC)    IVDU (intravenous drug user) 02/02/2022   Osteomyelitis of lumbar spine (HCC) 02/02/2022   Chronic pain 02/02/2022   Insomnia 02/02/2022   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   Constipation 02/02/2022   Infective endocarditis of tricuspid valve 02/02/2022   MSSA bacteremia 01/26/2022   Septic arthritis of lumbar spine (HCC) 01/17/2022   Controlled type 2 diabetes mellitus without complication, without long-term current use of insulin (HCC) 01/17/2022   Hypocalcemia 01/17/2022   Normocytic anemia 01/17/2022   Hyponatremia 01/17/2022   Hypomagnesemia 01/17/2022   Cellulitis of right foot 05/17/2019   Tobacco abuse    Opioid dependence, uncomplicated (HCC) 02/24/2018    Periodontal disease 10/11/2017   Hepatitis C antibody positive in blood 10/05/2017   Elevated platelet count 10/05/2017   History of substance abuse (HCC) 09/20/2017   History of vitamin D deficiency 09/20/2017   Essential hypertension 09/20/2017

## 2023-11-09 NOTE — ED Notes (Addendum)
 Pt currently in bathroom. No acute distress noted. No concerns voiced. Informed pt to notify staff with any needs or assistance. Pt verbalized understanding and agreement. Will continue to monitor for safety.

## 2023-11-09 NOTE — ED Notes (Signed)
 Pt approached nurses station requesting medication for anxiety and agitation. Pt states, "I feel like I'm getting the run around about my injection. Can I have something to keep me calm?" Pt given Vistaril for increased anxiety. Writer encouraged pt to watch tv or play cards with his peers. Pt got a book and went into the dayroom. Safety maintained.

## 2023-11-09 NOTE — Discharge Planning (Signed)
 LCSW provided patient with the contact information for [facility] residential program in order to complete phone screening.  Patient reports his goal is to be admitted into [facility] once he stable for discharge. Patient aware that LCSW is still awaiting phone call from [facility] regarding referral and update will be provided once received. Patient aware that alternative plan if not accepted would be to seek further placement within the area. Patient expressed understanding and stated he would complete phone screening today.  No other issues were reported at this time.  LCSW will continue to follow and provide support to patient while on [facility] unit.   Fernande Boyden, LCSW Clinical Social Worker [facility] Ph: [phone]

## 2023-11-09 NOTE — Group Note (Signed)
 Group Topic: Healthy Self Image and Positive Change  Group Date: 11/09/2023 Start Time: 1050 End Time: 1200 Facilitators: Cassandria Anger  Department: [facility]  Number of Participants: 6  Group Focus: goals/reality orientation Treatment Modality:  Psychoeducation Interventions utilized were patient education Purpose: increase insight  Name: Tanner [name] Date of Birth: Apr 25, 1966  MR: [id number]    Level of Participation: active Quality of Participation: attentive, cooperative, engaged, and motivated Interactions with others: gave feedback Mood/Affect: appropriate, bright, and positive Triggers (if applicable): N/A Cognition: coherent/clear, concrete, and insightful Progress: Gaining insight Response: Patient was asked what his long term goals are. Patient explained that he wanted to get his life in order. Patient stated that his stay at the [facility] has been helpful Plan: patient will be encouraged to continue to attend group  Patients Problems:  Patient Active Problem List   Diagnosis Date Noted   Paranoia (psychosis) (HCC) 11/06/2023   Generalized anxiety disorder 11/03/2023   Paranoia (HCC) 11/03/2023   Stimulant-induced psychotic disorder with delusions (HCC) 11/03/2023   Delusion (HCC) 02/10/2022   Acute low back pain    Osteomyelitis (HCC)    Paraspinal abscess (HCC)    IVDU (intravenous drug user) 02/02/2022   Osteomyelitis of lumbar spine (HCC) 02/02/2022   Chronic pain 02/02/2022   Insomnia 02/02/2022   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   Constipation 02/02/2022   Infective endocarditis of tricuspid valve 02/02/2022   MSSA bacteremia 01/26/2022   Septic arthritis of lumbar spine (HCC) 01/17/2022   Controlled type 2 diabetes mellitus without complication, without long-term current use of insulin (HCC) 01/17/2022   Hypocalcemia 01/17/2022   Normocytic anemia 01/17/2022   Hyponatremia 01/17/2022   Hypomagnesemia  01/17/2022   Cellulitis of right foot 05/17/2019   Tobacco abuse    Opioid dependence, uncomplicated (HCC) 02/24/2018   Periodontal disease 10/11/2017   Hepatitis C antibody positive in blood 10/05/2017   Elevated platelet count 10/05/2017   History of substance abuse (HCC) 09/20/2017   History of vitamin D deficiency 09/20/2017   Essential hypertension 09/20/2017

## 2023-11-09 NOTE — ED Notes (Signed)
Pt in bed at this hour. No apparent distress. RR even and unlabored. Monitored for safety.
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
## 2023-11-09 NOTE — Group Note (Signed)
 Group Topic: Decisional Balance/Substance Abuse  Group Date: 11/09/2023 Start Time: 2000 End Time: 2100 Facilitators: Lauro Vada Yellen, NT  Department: [facility]  Number of Participants: 7  Group Focus: community group and substance abuse education Treatment Modality:  Cognitive Behavioral Therapy Interventions utilized were leisure development, patient education, and support Purpose: enhance coping skills, express feelings, and increase insight  Name: Tanner [name] Date of Birth: [birth year]  MR: [id number]    Level of Participation: active Quality of Participation: attentive and cooperative Interactions with others: gave feedback Mood/Affect: appropriate Triggers (if applicable): N/A Cognition: coherent/clear Progress: Gaining insight Response: N/A Plan: patient will be encouraged to keep attending groups.  Patients Problems:  Patient Active Problem List   Diagnosis Date Noted   Paranoia (psychosis) (HCC) 11/06/2023   Generalized anxiety disorder 11/03/2023   Paranoia (HCC) 11/03/2023   Stimulant-induced psychotic disorder with delusions (HCC) 11/03/2023   Delusion (HCC) 02/10/2022   Acute low back pain    Osteomyelitis (HCC)    Paraspinal abscess (HCC)    IVDU (intravenous drug user) 02/02/2022   Osteomyelitis of lumbar spine (HCC) 02/02/2022   Chronic pain 02/02/2022   Insomnia 02/02/2022   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   Constipation 02/02/2022   Infective endocarditis of tricuspid valve 02/02/2022   MSSA bacteremia 01/26/2022   Septic arthritis of lumbar spine (HCC) 01/17/2022   Controlled type 2 diabetes mellitus without complication, without long-term current use of insulin (HCC) 01/17/2022   Hypocalcemia 01/17/2022   Normocytic anemia 01/17/2022   Hyponatremia 01/17/2022   Hypomagnesemia 01/17/2022   Cellulitis of right foot 05/17/2019   Tobacco abuse    Opioid dependence, uncomplicated (HCC) 02/24/2018    Periodontal disease 10/11/2017   Hepatitis C antibody positive in blood 10/05/2017   Elevated platelet count 10/05/2017   History of substance abuse (HCC) 09/20/2017   History of vitamin D deficiency 09/20/2017   Essential hypertension 09/20/2017

## 2023-11-09 NOTE — ED Notes (Signed)
 Pt sitting in AA group meeting actively participating and interacting with peers. No acute distress noted. No concerns voiced. Informed pt to notify staff with any needs or assistance. Pt verbalized understanding and agreement. Will continue to monitor for safety.

## 2023-11-09 NOTE — ED Notes (Signed)
Pt in dayroom at this hour. No apparent distress. RR even and unlabored. Monitored for safety.  

## 2023-11-09 NOTE — ED Notes (Signed)
 Patient A&Ox4. Denies intent to harm self/others when asked. Denies A/VH this am. Patient denies any physical complaints when asked. No acute distress noted. BS 238. Pt received 5 units Novolog coverage to R upper abd. Tolerated well. Re-iterated on decreasing sugar intake. Pt verbalized understanding and agreement. Routine safety checks conducted according to facility protocol. Encouraged patient to notify staff if thoughts of harm toward self or others arise. Patient verbalize understanding and agreement. Will continue to monitor for safety.

## 2023-11-09 NOTE — ED Notes (Signed)
 Patient was provided dinner
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
## 2023-11-09 NOTE — ED Notes (Signed)
 Patient is sleeping. Respirations equal and unlabored, skin warm and dry. No change in assessment or acuity. Routine safety checks conducted according to facility protocol. Will continue to monitor for safety.

## 2023-11-09 NOTE — ED Notes (Signed)
 Pt approached nurses station requesting to discharge. Pt states, "I came here for my injection only and I still haven't got it. I can't just keep sitting around here doing nothing when there is work to be done on the outside. I don't want to go to no residential place. I already have a place to stay. I just need my injection and if y'all can't give it to me, then I understand but I have to leave. They told me when I came in here that I could leave whenever I want. So, if she's not giving me the injection, then I'm ready to go. Tanner [name], SW and Clinical research associate encouraged pt to stay until he speaks with the provider tomorrow and resources can be provided to pt. Pt agreed to stay until he speak with provider tomorrow. Calm, cooperative with staff. No prn's required. Will continue to monitor for safety.

## 2023-11-09 NOTE — ED Provider Notes (Addendum)
 Behavioral Health Progress Note  Date and Time: 11/09/2023 2:33 PM Name: Tanner [name] MRN:  [id number]   he patient is a 58 year old male with a history of methamphetamine and marijuana abuse.  Over the past year he has been admitted to [facility] on 2 occasions, both of which appear to be related to stimulant induced psychosis.  At the last hospitalization (in November 2024), the patient received Gean Birchwood.  On the present occasion, 11/03/2023, the patient presented to [facility] long ED requesting an [place] shot and was noted to be experiencing psychotic symptoms.  He was transferred to the facility based crisis for further treatment.  Subjective:    Chart reviewed, case discussed with social worker, patient seen today during rounds.  On assessment patient endorsed" fine" mood.  Patient continues to maintain the fact that he came to the ER to get Hinda Glatter sustain a long-acting injection, which according to patient he received last in November 2024.  Patient was informed about changes in his EKG.  He was informed that repeat EKG EKG shows same changes.  He was encouraged to continue taking oral Invega.  Today patient denies auditory or visual hallucinations today.  He denies thoughts of harming himself or others.  Patient was encouraged to abstain from drug use.  Patient is working with Child psychotherapist to get a bed at [facility] or [facility].   Diagnosis:  Final diagnoses:  Methamphetamine abuse (HCC)    Total Time spent with patient: 15 minutes  Past Psychiatric History: See H&P and above Past Medical History: See H&P and above Family History: See H&P and above Family Psychiatric  History: See H&P and above Social History: See H&P and above  Additional Social History:  none  Sleep: fair  Appetite:  fair  Current Medications:  Current Facility-Administered Medications  Medication Dose Route Frequency Provider Last Rate Last Admin   acetaminophen (TYLENOL)  tablet 650 mg  650 mg Oral Q6H PRN Dixon, Rashaun M, NP       alum & mag hydroxide-simeth (MAALOX/MYLANTA) 200-200-20 MG/5ML suspension 30 mL  30 mL Oral Q4H PRN Durwin Nora, Rashaun M, NP       buprenorphine-naloxone (SUBOXONE) 8-2 mg per SL tablet 1 tablet  1 tablet Sublingual BID Jearld Lesch, NP   1 tablet at 11/09/23 1003   haloperidol (HALDOL) tablet 5 mg  5 mg Oral TID PRN Jearld Lesch, NP       And   diphenhydrAMINE (BENADRYL) capsule 50 mg  50 mg Oral TID PRN Jearld Lesch, NP       haloperidol lactate (HALDOL) injection 5 mg  5 mg Intramuscular TID PRN Jearld Lesch, NP       And   diphenhydrAMINE (BENADRYL) injection 50 mg  50 mg Intramuscular TID PRN Jearld Lesch, NP       And   LORazepam (ATIVAN) injection 2 mg  2 mg Intramuscular TID PRN Jearld Lesch, NP       haloperidol lactate (HALDOL) injection 10 mg  10 mg Intramuscular TID PRN Jearld Lesch, NP       And   diphenhydrAMINE (BENADRYL) injection 50 mg  50 mg Intramuscular TID PRN Jearld Lesch, NP       And   LORazepam (ATIVAN) injection 2 mg  2 mg Intramuscular TID PRN Jearld Lesch, NP       docusate sodium (COLACE) capsule 100 mg  100 mg Oral BID Lewanda Rife, MD  100 mg at 11/09/23 1003   hydrOXYzine (ATARAX) tablet 25 mg  25 mg Oral TID PRN Jearld Lesch, NP   25 mg at 11/06/23 0329   insulin aspart (novoLOG) injection 0-15 Units  0-15 Units Subcutaneous TID WC Sindy Guadeloupe, NP   11 Units at 11/09/23 1241   insulin aspart (novoLOG) injection 0-5 Units  0-5 Units Subcutaneous QHS Sindy Guadeloupe, NP   5 Units at 11/08/23 2305   magnesium hydroxide (MILK OF MAGNESIA) suspension 30 mL  30 mL Oral Daily PRN Jearld Lesch, NP       paliperidone (INVEGA) 24 hr tablet 6 mg  6 mg Oral Daily Lewanda Rife, MD   6 mg at 11/09/23 1003   traZODone (DESYREL) tablet 50 mg  50 mg Oral QHS PRN Jearld Lesch, NP   50 mg at 11/08/23 2116   Current Outpatient Medications  Medication Sig Dispense  Refill   ARIPiprazole (ABILIFY) 5 MG tablet Take 5 mg by mouth at bedtime.     buprenorphine-naloxone (SUBOXONE) 8-2 mg SUBL SL tablet Place 1 tablet under the tongue 2 (two) times daily.      Labs  Lab Results:  Admission on 11/06/2023  Component Date Value Ref Range Status   Hgb A1c MFr Bld 11/07/2023 8.7 (H)  4.8 - 5.6 % Final   Comment: (NOTE) Pre diabetes:          5.7%-6.4%  Diabetes:              >6.4%  Glycemic control for   <7.0% adults with diabetes    Mean Plasma Glucose 11/07/2023 202.99  mg/dL Final   Performed at [facility], [address], [place], [place] [address]   Sodium 11/07/2023 132 (L)  135 - 145 mmol/L Final   Potassium 11/07/2023 4.6  3.5 - 5.1 mmol/L Final   Chloride 11/07/2023 95 (L)  98 - 111 mmol/L Final   CO2 11/07/2023 26  22 - 32 mmol/L Final   Glucose, Bld 11/07/2023 251 (H)  70 - 99 mg/dL Final   Glucose reference range applies only to samples taken after fasting for at least 8 hours.   BUN 11/07/2023 22 (H)  6 - 20 mg/dL Final   Creatinine, Ser 11/07/2023 0.99  0.61 - 1.24 mg/dL Final   Calcium 47/82/9562 9.0  8.9 - 10.3 mg/dL Final   GFR, Estimated 11/07/2023 >60  >60 mL/min Final   Comment: (NOTE) Calculated using the CKD-EPI Creatinine Equation (2021)    Anion gap 11/07/2023 11  5 - 15 Final   Performed at [facility], [address], [place], [place] [address]   Glucose-Capillary 11/07/2023 265 (H)  70 - 99 mg/dL Final   Glucose reference range applies only to samples taken after fasting for at least 8 hours.   Glucose-Capillary 11/06/2023 228 (H)  70 - 99 mg/dL Final   Glucose reference range applies only to samples taken after fasting for at least 8 hours.   Glucose-Capillary 11/07/2023 256 (H)  70 - 99 mg/dL Final   Glucose reference range applies only to samples taken after fasting for at least 8 hours.   Glucose-Capillary 11/08/2023 261 (H)  70 - 99 mg/dL Final   Glucose reference range applies only to samples taken  after fasting for at least 8 hours.   Glucose-Capillary 11/08/2023 234 (H)  70 - 99 mg/dL Final   Glucose reference range applies only to samples taken after fasting for at least  8 hours.   Glucose-Capillary 11/08/2023 364 (H)  70 - 99 mg/dL Final   Glucose reference range applies only to samples taken after fasting for at least 8 hours.   Glucose-Capillary 11/09/2023 238 (H)  70 - 99 mg/dL Final   Glucose reference range applies only to samples taken after fasting for at least 8 hours.   Glucose-Capillary 11/09/2023 303 (H)  70 - 99 mg/dL Final   Glucose reference range applies only to samples taken after fasting for at least 8 hours.  Admission on 11/03/2023, Discharged on 11/06/2023  Component Date Value Ref Range Status   Sodium 11/03/2023 131 (L)  135 - 145 mmol/L Final   Potassium 11/03/2023 4.0  3.5 - 5.1 mmol/L Final   Chloride 11/03/2023 97 (L)  98 - 111 mmol/L Final   CO2 11/03/2023 26  22 - 32 mmol/L Final   Glucose, Bld 11/03/2023 357 (H)  70 - 99 mg/dL Final   Glucose reference range applies only to samples taken after fasting for at least 8 hours.   BUN 11/03/2023 21 (H)  6 - 20 mg/dL Final   Creatinine, Ser 11/03/2023 0.78  0.61 - 1.24 mg/dL Final   Calcium 28/41/3244 9.1  8.9 - 10.3 mg/dL Final   Total Protein 08/18/7251 7.0  6.5 - 8.1 g/dL Final   Albumin 66/44/0347 4.0  3.5 - 5.0 g/dL Final   AST 42/59/5638 31  15 - 41 U/L Final   ALT 11/03/2023 33  0 - 44 U/L Final   Alkaline Phosphatase 11/03/2023 98  38 - 126 U/L Final   Total Bilirubin 11/03/2023 0.5  0.0 - 1.2 mg/dL Final   GFR, Estimated 11/03/2023 >60  >60 mL/min Final   Comment: (NOTE) Calculated using the CKD-EPI Creatinine Equation (2021)    Anion gap 11/03/2023 8  5 - 15 Final   Performed at [facility], [address] [place], [place] [address]   Alcohol, Ethyl (B) 11/03/2023 <10  <10 mg/dL Final   Comment: (NOTE) Lowest detectable limit for serum alcohol is 10 mg/dL.  For medical  purposes only. Performed at [facility], [address] [place], [place] [address]    Opiates 11/03/2023 NONE DETECTED  NONE DETECTED Final   Cocaine 11/03/2023 NONE DETECTED  NONE DETECTED Final   Benzodiazepines 11/03/2023 NONE DETECTED  NONE DETECTED Final   Amphetamines 11/03/2023 POSITIVE (A)  NONE DETECTED Final   Comment: (NOTE) Trazodone is metabolized in vivo to several metabolites, including pharmacologically active m-CPP, which is excreted in the urine. Immunoassay screens for amphetamines and MDMA have potential cross-reactivity with these compounds and may provide false positive  results.     Tetrahydrocannabinol 11/03/2023 POSITIVE (A)  NONE DETECTED Final   Barbiturates 11/03/2023 NONE DETECTED  NONE DETECTED Final   Comment: (NOTE) DRUG SCREEN FOR MEDICAL PURPOSES ONLY.  IF CONFIRMATION IS NEEDED FOR ANY PURPOSE, NOTIFY LAB WITHIN 5 DAYS.  LOWEST DETECTABLE LIMITS FOR URINE DRUG SCREEN Drug Class                     Cutoff (ng/mL) Amphetamine and metabolites    1000 Barbiturate and metabolites    200 Benzodiazepine                 200 Opiates and metabolites        300 Cocaine and metabolites        300 THC  50 Performed at [facility], [address] [place], [place] [address]    WBC 11/03/2023 4.6  4.0 - 10.5 K/uL Final   RBC 11/03/2023 4.37  4.22 - 5.81 MIL/uL Final   Hemoglobin 11/03/2023 12.2 (L)  13.0 - 17.0 g/dL Final   HCT 60/45/4098 35.9 (L)  39.0 - 52.0 % Final   MCV 11/03/2023 82.2  80.0 - 100.0 fL Final   MCH 11/03/2023 27.9  26.0 - 34.0 pg Final   MCHC 11/03/2023 34.0  30.0 - 36.0 g/dL Final   RDW 11/91/4782 12.9  11.5 - 15.5 % Final   Platelets 11/03/2023 288  150 - 400 K/uL Final   nRBC 11/03/2023 0.0  0.0 - 0.2 % Final   Neutrophils Relative % 11/03/2023 66  % Final   Neutro Abs 11/03/2023 3.0  1.7 - 7.7 K/uL Final   Lymphocytes Relative 11/03/2023 22  % Final   Lymphs  Abs 11/03/2023 1.0  0.7 - 4.0 K/uL Final   Monocytes Relative 11/03/2023 9  % Final   Monocytes Absolute 11/03/2023 0.4  0.1 - 1.0 K/uL Final   Eosinophils Relative 11/03/2023 2  % Final   Eosinophils Absolute 11/03/2023 0.1  0.0 - 0.5 K/uL Final   Basophils Relative 11/03/2023 1  % Final   Basophils Absolute 11/03/2023 0.0  0.0 - 0.1 K/uL Final   Immature Granulocytes 11/03/2023 0  % Final   Abs Immature Granulocytes 11/03/2023 0.01  0.00 - 0.07 K/uL Final   Performed at [facility], [address] [place], [place] [address]   Glucose-Capillary 11/03/2023 180 (H)  70 - 99 mg/dL Final   Glucose reference range applies only to samples taken after fasting for at least 8 hours.   SARS Coronavirus 2 by RT PCR 11/04/2023 NEGATIVE  NEGATIVE Final   Comment: (NOTE) SARS-CoV-2 target nucleic acids are NOT DETECTED.  The SARS-CoV-2 RNA is generally detectable in upper respiratory specimens during the acute phase of infection. The lowest concentration of SARS-CoV-2 viral copies this assay can detect is 138 copies/mL. A negative result does not preclude SARS-Cov-2 infection and should not be used as the sole basis for treatment or other patient management decisions. A negative result may occur with  improper specimen collection/handling, submission of specimen other than nasopharyngeal swab, presence of viral mutation(s) within the areas targeted by this assay, and inadequate number of viral copies(<138 copies/mL). A negative result must be combined with clinical observations, patient history, and epidemiological information. The expected result is Negative.  Fact Sheet for Patients:  BloggerCourse.com  Fact Sheet for Healthcare Providers:  SeriousBroker.it  This test is no                          t yet approved or cleared by the [place] FDA and  has been authorized for detection and/or diagnosis of SARS-CoV-2  by FDA under an Emergency Use Authorization (EUA). This EUA will remain  in effect (meaning this test can be used) for the duration of the COVID-19 declaration under Section 564(b)(1) of the Act, 21 U.S.C.section 360bbb-3(b)(1), unless the authorization is terminated  or revoked sooner.       Influenza A by PCR 11/04/2023 NEGATIVE  NEGATIVE Final   Influenza B by PCR 11/04/2023 NEGATIVE  NEGATIVE Final   Comment: (NOTE) The Xpert Xpress SARS-CoV-2/FLU/RSV plus assay is intended as an aid in the diagnosis of influenza from Nasopharyngeal swab specimens and should not be  used as a sole basis for treatment. Nasal washings and aspirates are unacceptable for Xpert Xpress SARS-CoV-2/FLU/RSV testing.  Fact Sheet for Patients: BloggerCourse.com  Fact Sheet for Healthcare Providers: SeriousBroker.it  This test is not yet approved or cleared by the [place] FDA and has been authorized for detection and/or diagnosis of SARS-CoV-2 by FDA under an Emergency Use Authorization (EUA). This EUA will remain in effect (meaning this test can be used) for the duration of the COVID-19 declaration under Section 564(b)(1) of the Act, 21 U.S.C. section 360bbb-3(b)(1), unless the authorization is terminated or revoked.     Resp Syncytial Virus by PCR 11/04/2023 NEGATIVE  NEGATIVE Final   Comment: (NOTE) Fact Sheet for Patients: BloggerCourse.com  Fact Sheet for Healthcare Providers: SeriousBroker.it  This test is not yet approved or cleared by the [place] FDA and has been authorized for detection and/or diagnosis of SARS-CoV-2 by FDA under an Emergency Use Authorization (EUA). This EUA will remain in effect (meaning this test can be used) for the duration of the COVID-19 declaration under Section 564(b)(1) of the Act, 21 U.S.C. section 360bbb-3(b)(1), unless the authorization is terminated  or revoked.  Performed at [facility], [address], [place], [place] [address]    Glucose-Capillary 11/04/2023 171 (H)  70 - 99 mg/dL Final   Glucose reference range applies only to samples taken after fasting for at least 8 hours.  Admission on 06/28/2023, Discharged on 06/28/2023  Component Date Value Ref Range Status   Glucose-Capillary 06/28/2023 81  70 - 99 mg/dL Final   Glucose reference range applies only to samples taken after fasting for at least 8 hours.   Comment 1 06/28/2023 Notify RN   Final   WBC 06/28/2023 9.5  4.0 - 10.5 K/uL Final   RBC 06/28/2023 4.29  4.22 - 5.81 MIL/uL Final   Hemoglobin 06/28/2023 12.5 (L)  13.0 - 17.0 g/dL Final   HCT 60/45/4098 36.3 (L)  39.0 - 52.0 % Final   MCV 06/28/2023 84.6  80.0 - 100.0 fL Final   MCH 06/28/2023 29.1  26.0 - 34.0 pg Final   MCHC 06/28/2023 34.4  30.0 - 36.0 g/dL Final   RDW 11/91/4782 13.0  11.5 - 15.5 % Final   Platelets 06/28/2023 374  150 - 400 K/uL Final   nRBC 06/28/2023 0.0  0.0 - 0.2 % Final   Neutrophils Relative % 06/28/2023 70  % Final   Neutro Abs 06/28/2023 6.8  1.7 - 7.7 K/uL Final   Lymphocytes Relative 06/28/2023 20  % Final   Lymphs Abs 06/28/2023 1.9  0.7 - 4.0 K/uL Final   Monocytes Relative 06/28/2023 7  % Final   Monocytes Absolute 06/28/2023 0.6  0.1 - 1.0 K/uL Final   Eosinophils Relative 06/28/2023 1  % Final   Eosinophils Absolute 06/28/2023 0.1  0.0 - 0.5 K/uL Final   Basophils Relative 06/28/2023 1  % Final   Basophils Absolute 06/28/2023 0.1  0.0 - 0.1 K/uL Final   Immature Granulocytes 06/28/2023 1  % Final   Abs Immature Granulocytes 06/28/2023 0.05  0.00 - 0.07 K/uL Final   Performed at [facility], [address] [place], [place] [address]   Sodium 06/28/2023 135  135 - 145 mmol/L Final   Potassium 06/28/2023 4.5  3.5 - 5.1 mmol/L Final   Chloride 06/28/2023 100  98 - 111 mmol/L Final   CO2 06/28/2023 26  22 - 32 mmol/L Final   Glucose, Bld  06/28/2023  83  70 - 99 mg/dL Final   Glucose reference range applies only to samples taken after fasting for at least 8 hours.   BUN 06/28/2023 38 (H)  6 - 20 mg/dL Final   Creatinine, Ser 06/28/2023 1.11  0.61 - 1.24 mg/dL Final   Calcium 16/05/9603 9.2  8.9 - 10.3 mg/dL Final   Total Protein 54/04/8118 7.4  6.5 - 8.1 g/dL Final   Albumin 14/78/2956 4.0  3.5 - 5.0 g/dL Final   AST 21/30/8657 22  15 - 41 U/L Final   ALT 06/28/2023 20  0 - 44 U/L Final   Alkaline Phosphatase 06/28/2023 81  38 - 126 U/L Final   Total Bilirubin 06/28/2023 0.3  <1.2 mg/dL Final   GFR, Estimated 06/28/2023 >60  >60 mL/min Final   Comment: (NOTE) Calculated using the CKD-EPI Creatinine Equation (2021)    Anion gap 06/28/2023 9  5 - 15 Final   Performed at [facility], [address] [place], [place] [address]    Blood Alcohol level:  Lab Results  Component Value Date   [facility] <10 11/03/2023   ETH <10 09/17/2022    Metabolic Disorder Labs: Lab Results  Component Value Date   HGBA1C 8.7 (H) 11/07/2023   MPG 202.99 11/07/2023   MPG 217.34 09/17/2022   No results found for: "PROLACTIN" Lab Results  Component Value Date   CHOL 153 09/17/2022   TRIG 58 09/17/2022   HDL 71 09/17/2022   CHOLHDL 2.2 09/17/2022   VLDL 12 09/17/2022   LDLCALC 70 09/17/2022    Therapeutic Lab Levels: No results found for: "LITHIUM" No results found for: "VALPROATE" No results found for: "CBMZ"  Physical Findings   PHQ2-9    Flowsheet Row ED from 11/06/2023 in [facility]  PHQ-2 Total Score 4  PHQ-9 Total Score 17      Flowsheet Row ED from 11/06/2023 in [facility] ED from 11/03/2023 in [facility] Emergency Department at [facility] ED from 07/18/2023 in [facility] Emergency Department at [facility]  C-SSRS RISK CATEGORY No Risk No Risk No Risk        Psychiatric Specialty Exam: Physical Exam Constitutional:       Appearance: the patient is not toxic-appearing.  Pulmonary:     Effort: Pulmonary effort is normal.  Neurological:     General: No focal deficit present.     Mental Status: the patient is alert and oriented to person, place, and time.   Review of Systems  Respiratory:  Negative for shortness of breath.   Cardiovascular:  Negative for chest pain.  Gastrointestinal:  Negative for abdominal pain, constipation, diarrhea, nausea and vomiting.  Neurological:  Negative for headaches.      BP (!) 145/80 (BP Location: Right Arm) Comment: Notified RN  Pulse 77   Temp 98.4 F (36.9 C) (Oral)   Resp 20   SpO2 98%   General Appearance: Fairly Groomed  Eye Contact:  Good  Speech:  Clear and Coherent  Volume:  Normal  Mood:  "fine"  Affect:  Congruent  Thought Process:  Coherent  Orientation:  Full (Time, Place, and Person)  Thought Content: Logical   Suicidal Thoughts:  No  Homicidal Thoughts:  No  Memory:  Immediate;   Good  Judgement:  fair  Insight:  fair  Psychomotor Activity:  Normal  Concentration:  Concentration: Good  Recall:  Good  Fund of Knowledge: Good  Language:  Good  Akathisia:  No  Handed:  not assessed  AIMS (if indicated): not done  Assets:  Communication Skills Desire for Improvement Leisure Time Physical Health  ADL's:  Intact  Cognition: WNL  Sleep:  Fair    Treatment Plan Summary: Daily contact with patient to assess and evaluate symptoms and progress in treatment and Medication management.  Stimulant induced psychosis, improving - Continue on Invega 6 mg today, he denies any side effect from [place]  -EKG ordered, 11/09/2023 -No change from previous EKG, pt denies chest pain or irregular heart beats/arrhythmias  - Recommend residential rehab.  Social worker has referred patient to [facility]  Continue on  Suboxone, will continue this medication.   Lewanda Rife, MD

## 2023-11-10 DIAGNOSIS — E119 Type 2 diabetes mellitus without complications: Secondary | ICD-10-CM | POA: Diagnosis not present

## 2023-11-10 DIAGNOSIS — I4519 Other right bundle-branch block: Secondary | ICD-10-CM | POA: Diagnosis not present

## 2023-11-10 DIAGNOSIS — F1510 Other stimulant abuse, uncomplicated: Secondary | ICD-10-CM | POA: Diagnosis not present

## 2023-11-10 LAB — GLUCOSE, CAPILLARY
Glucose-Capillary: 215 mg/dL — ABNORMAL HIGH (ref 70–99)
Glucose-Capillary: 338 mg/dL — ABNORMAL HIGH (ref 70–99)
Glucose-Capillary: 269 mg/dL — ABNORMAL HIGH (ref 70–99)

## 2023-11-10 MED ORDER — PALIPERIDONE PALMITATE ER 234 MG/1.5ML IM SUSY
234.0000 mg | PREFILLED_SYRINGE | Freq: Once | INTRAMUSCULAR | Status: AC
  Administered 2023-11-10: 234 mg via INTRAMUSCULAR

## 2023-11-10 MED ORDER — PALIPERIDONE ER 3 MG PO TB24
3.0000 mg | ORAL_TABLET | Freq: Every day | ORAL | Status: DC
  Administered 2023-11-11: 3 mg via ORAL
  Filled 2023-11-10: qty 1

## 2023-11-10 NOTE — ED Provider Notes (Signed)
 Behavioral Health Progress Note  Date and Time: 11/10/2023 1:52 PM Name: Tanner [name] MRN:  [id number]   he patient is a 58 year old male with a history of methamphetamine and marijuana abuse.  Over the past year he has been admitted to [facility] on 2 occasions, both of which appear to be related to stimulant induced psychosis.  At the last hospitalization (in November 2024), the patient received Gean Birchwood.  On the present occasion, 11/03/2023, the patient presented to [facility] long ED requesting an [place] shot and was noted to be experiencing psychotic symptoms.  He was transferred to the facility based crisis for further treatment.  Subjective:    Chart reviewed, case discussed with social worker, patient seen today during rounds.  On assessment patient endorsed" fine" mood.  Patient continues to maintain the fact that he came to the ER to get Hinda Glatter sustain a long-acting injection, which according to patient he received last in November 2024.  Today I talked to cardiologist, Dr. Tenny Craw via phone to discuss patient's EKG.  Dr. Tenny Craw interpreted the results of patient's EKG from 11/03/2023, 11/09/2023, 11/10/2023.  Dr. Tenny Craw reports that patient's EKG are" fine", she said that it is computer analyzed results which are not reliable.  She cleared the patient to get Gean Birchwood IM injections.  Patient was given Hinda Glatter Sustenna 234 mg IM today.  Today patient denies auditory or visual hallucinations today.  He denies thoughts of harming himself or others.  Patient was encouraged to abstain from drug use.  Patient was informed about his PHQ score yesterday.  Patient said that he misunderstood the instructions.  He said he filled in PHQ score  9 scores based on his symptoms at the time of admission.  Patient did another PHQ today and scored 0.  Diagnosis:  Final diagnoses:  Methamphetamine abuse (HCC)    Total Time spent with patient: 15 minutes  Past Psychiatric  History: See H&P and above Past Medical History: See H&P and above Family History: See H&P and above Family Psychiatric  History: See H&P and above Social History: See H&P and above  Additional Social History:  none  Sleep: fair  Appetite:  fair  Current Medications:  Current Facility-Administered Medications  Medication Dose Route Frequency Provider Last Rate Last Admin   acetaminophen (TYLENOL) tablet 650 mg  650 mg Oral Q6H PRN Dixon, Rashaun M, NP       alum & mag hydroxide-simeth (MAALOX/MYLANTA) 200-200-20 MG/5ML suspension 30 mL  30 mL Oral Q4H PRN Durwin Nora, Rashaun M, NP       buprenorphine-naloxone (SUBOXONE) 8-2 mg per SL tablet 1 tablet  1 tablet Sublingual BID Jearld Lesch, NP   1 tablet at 11/10/23 0836   haloperidol (HALDOL) tablet 5 mg  5 mg Oral TID PRN Jearld Lesch, NP   5 mg at 11/09/23 1744   And   diphenhydrAMINE (BENADRYL) capsule 50 mg  50 mg Oral TID PRN Jearld Lesch, NP   50 mg at 11/09/23 1744   haloperidol lactate (HALDOL) injection 5 mg  5 mg Intramuscular TID PRN Jearld Lesch, NP       And   diphenhydrAMINE (BENADRYL) injection 50 mg  50 mg Intramuscular TID PRN Jearld Lesch, NP       And   LORazepam (ATIVAN) injection 2 mg  2 mg Intramuscular TID PRN Jearld Lesch, NP       haloperidol lactate (HALDOL) injection 10 mg  10 mg  Intramuscular TID PRN Jearld Lesch, NP       And   diphenhydrAMINE (BENADRYL) injection 50 mg  50 mg Intramuscular TID PRN Jearld Lesch, NP       And   LORazepam (ATIVAN) injection 2 mg  2 mg Intramuscular TID PRN Durwin Nora, Rashaun M, NP       docusate sodium (COLACE) capsule 100 mg  100 mg Oral BID Lewanda Rife, MD   100 mg at 11/10/23 0836   hydrOXYzine (ATARAX) tablet 25 mg  25 mg Oral TID PRN Jearld Lesch, NP   25 mg at 11/09/23 1615   insulin aspart (novoLOG) injection 0-15 Units  0-15 Units Subcutaneous TID WC Sindy Guadeloupe, NP   11 Units at 11/10/23 1137   insulin aspart (novoLOG) injection  0-5 Units  0-5 Units Subcutaneous QHS Sindy Guadeloupe, NP   4 Units at 11/09/23 2135   magnesium hydroxide (MILK OF MAGNESIA) suspension 30 mL  30 mL Oral Daily PRN Jearld Lesch, NP       nicotine polacrilex (NICORETTE) gum 2 mg  2 mg Oral PRN Myriam Forehand, NP   2 mg at 11/10/23 1128   [START ON 11/11/2023] paliperidone (INVEGA) 24 hr tablet 3 mg  3 mg Oral Daily Lewanda Rife, MD       traZODone (DESYREL) tablet 50 mg  50 mg Oral QHS PRN Jearld Lesch, NP   50 mg at 11/09/23 2134   Current Outpatient Medications  Medication Sig Dispense Refill   ARIPiprazole (ABILIFY) 5 MG tablet Take 5 mg by mouth at bedtime.     buprenorphine-naloxone (SUBOXONE) 8-2 mg SUBL SL tablet Place 1 tablet under the tongue 2 (two) times daily.      Labs  Lab Results:  Admission on 11/06/2023  Component Date Value Ref Range Status   Hgb A1c MFr Bld 11/07/2023 8.7 (H)  4.8 - 5.6 % Final   Comment: (NOTE) Pre diabetes:          5.7%-6.4%  Diabetes:              >6.4%  Glycemic control for   <7.0% adults with diabetes    Mean Plasma Glucose 11/07/2023 202.99  mg/dL Final   Performed at [facility], [address], [place], [place] [address]   Sodium 11/07/2023 132 (L)  135 - 145 mmol/L Final   Potassium 11/07/2023 4.6  3.5 - 5.1 mmol/L Final   Chloride 11/07/2023 95 (L)  98 - 111 mmol/L Final   CO2 11/07/2023 26  22 - 32 mmol/L Final   Glucose, Bld 11/07/2023 251 (H)  70 - 99 mg/dL Final   Glucose reference range applies only to samples taken after fasting for at least 8 hours.   BUN 11/07/2023 22 (H)  6 - 20 mg/dL Final   Creatinine, Ser 11/07/2023 0.99  0.61 - 1.24 mg/dL Final   Calcium 60/45/4098 9.0  8.9 - 10.3 mg/dL Final   GFR, Estimated 11/07/2023 >60  >60 mL/min Final   Comment: (NOTE) Calculated using the CKD-EPI Creatinine Equation (2021)    Anion gap 11/07/2023 11  5 - 15 Final   Performed at [facility], [address], [place], [place] [address]   Glucose-Capillary  11/07/2023 265 (H)  70 - 99 mg/dL Final   Glucose reference range applies only to samples taken after fasting for at least 8 hours.   Glucose-Capillary 11/06/2023 228 (H)  70 - 99 mg/dL Final  Glucose reference range applies only to samples taken after fasting for at least 8 hours.   Glucose-Capillary 11/07/2023 256 (H)  70 - 99 mg/dL Final   Glucose reference range applies only to samples taken after fasting for at least 8 hours.   Glucose-Capillary 11/08/2023 261 (H)  70 - 99 mg/dL Final   Glucose reference range applies only to samples taken after fasting for at least 8 hours.   Glucose-Capillary 11/08/2023 234 (H)  70 - 99 mg/dL Final   Glucose reference range applies only to samples taken after fasting for at least 8 hours.   Glucose-Capillary 11/08/2023 364 (H)  70 - 99 mg/dL Final   Glucose reference range applies only to samples taken after fasting for at least 8 hours.   Glucose-Capillary 11/09/2023 238 (H)  70 - 99 mg/dL Final   Glucose reference range applies only to samples taken after fasting for at least 8 hours.   Glucose-Capillary 11/09/2023 303 (H)  70 - 99 mg/dL Final   Glucose reference range applies only to samples taken after fasting for at least 8 hours.   Glucose-Capillary 11/09/2023 221 (H)  70 - 99 mg/dL Final   Glucose reference range applies only to samples taken after fasting for at least 8 hours.   Glucose-Capillary 11/09/2023 327 (H)  70 - 99 mg/dL Final   Glucose reference range applies only to samples taken after fasting for at least 8 hours.   Glucose-Capillary 11/10/2023 215 (H)  70 - 99 mg/dL Final   Glucose reference range applies only to samples taken after fasting for at least 8 hours.   Glucose-Capillary 11/10/2023 338 (H)  70 - 99 mg/dL Final   Glucose reference range applies only to samples taken after fasting for at least 8 hours.  Admission on 11/03/2023, Discharged on 11/06/2023  Component Date Value Ref Range Status   Sodium 11/03/2023 131 (L)   135 - 145 mmol/L Final   Potassium 11/03/2023 4.0  3.5 - 5.1 mmol/L Final   Chloride 11/03/2023 97 (L)  98 - 111 mmol/L Final   CO2 11/03/2023 26  22 - 32 mmol/L Final   Glucose, Bld 11/03/2023 357 (H)  70 - 99 mg/dL Final   Glucose reference range applies only to samples taken after fasting for at least 8 hours.   BUN 11/03/2023 21 (H)  6 - 20 mg/dL Final   Creatinine, Ser 11/03/2023 0.78  0.61 - 1.24 mg/dL Final   Calcium 19/14/7829 9.1  8.9 - 10.3 mg/dL Final   Total Protein 56/21/3086 7.0  6.5 - 8.1 g/dL Final   Albumin 57/84/6962 4.0  3.5 - 5.0 g/dL Final   AST 95/28/4132 31  15 - 41 U/L Final   ALT 11/03/2023 33  0 - 44 U/L Final   Alkaline Phosphatase 11/03/2023 98  38 - 126 U/L Final   Total Bilirubin 11/03/2023 0.5  0.0 - 1.2 mg/dL Final   GFR, Estimated 11/03/2023 >60  >60 mL/min Final   Comment: (NOTE) Calculated using the CKD-EPI Creatinine Equation (2021)    Anion gap 11/03/2023 8  5 - 15 Final   Performed at [facility], [address], [place], [place] [address]   Alcohol, Ethyl (B) 11/03/2023 <10  <10 mg/dL Final   Comment: (NOTE) Lowest detectable limit for serum alcohol is 10 mg/dL.  For medical purposes only. Performed at [facility], [address] [place], [place] [address]    Opiates 11/03/2023 NONE DETECTED  NONE DETECTED  Final   Cocaine 11/03/2023 NONE DETECTED  NONE DETECTED Final   Benzodiazepines 11/03/2023 NONE DETECTED  NONE DETECTED Final   Amphetamines 11/03/2023 POSITIVE (A)  NONE DETECTED Final   Comment: (NOTE) Trazodone is metabolized in vivo to several metabolites, including pharmacologically active m-CPP, which is excreted in the urine. Immunoassay screens for amphetamines and MDMA have potential cross-reactivity with these compounds and may provide false positive  results.     Tetrahydrocannabinol 11/03/2023 POSITIVE (A)  NONE DETECTED Final   Barbiturates 11/03/2023 NONE DETECTED  NONE DETECTED  Final   Comment: (NOTE) DRUG SCREEN FOR MEDICAL PURPOSES ONLY.  IF CONFIRMATION IS NEEDED FOR ANY PURPOSE, NOTIFY LAB WITHIN 5 DAYS.  LOWEST DETECTABLE LIMITS FOR URINE DRUG SCREEN Drug Class                     Cutoff (ng/mL) Amphetamine and metabolites    1000 Barbiturate and metabolites    200 Benzodiazepine                 200 Opiates and metabolites        300 Cocaine and metabolites        300 THC                            50 Performed at [facility], [address] [place], [place] [address]    WBC 11/03/2023 4.6  4.0 - 10.5 K/uL Final   RBC 11/03/2023 4.37  4.22 - 5.81 MIL/uL Final   Hemoglobin 11/03/2023 12.2 (L)  13.0 - 17.0 g/dL Final   HCT 60/45/4098 35.9 (L)  39.0 - 52.0 % Final   MCV 11/03/2023 82.2  80.0 - 100.0 fL Final   MCH 11/03/2023 27.9  26.0 - 34.0 pg Final   MCHC 11/03/2023 34.0  30.0 - 36.0 g/dL Final   RDW 11/91/4782 12.9  11.5 - 15.5 % Final   Platelets 11/03/2023 288  150 - 400 K/uL Final   nRBC 11/03/2023 0.0  0.0 - 0.2 % Final   Neutrophils Relative % 11/03/2023 66  % Final   Neutro Abs 11/03/2023 3.0  1.7 - 7.7 K/uL Final   Lymphocytes Relative 11/03/2023 22  % Final   Lymphs Abs 11/03/2023 1.0  0.7 - 4.0 K/uL Final   Monocytes Relative 11/03/2023 9  % Final   Monocytes Absolute 11/03/2023 0.4  0.1 - 1.0 K/uL Final   Eosinophils Relative 11/03/2023 2  % Final   Eosinophils Absolute 11/03/2023 0.1  0.0 - 0.5 K/uL Final   Basophils Relative 11/03/2023 1  % Final   Basophils Absolute 11/03/2023 0.0  0.0 - 0.1 K/uL Final   Immature Granulocytes 11/03/2023 0  % Final   Abs Immature Granulocytes 11/03/2023 0.01  0.00 - 0.07 K/uL Final   Performed at [facility] [place] [facility], [address] [place], [place] [address]   Glucose-Capillary 11/03/2023 180 (H)  70 - 99 mg/dL Final   Glucose reference range applies only to samples taken after fasting for at least 8 hours.   SARS Coronavirus 2 by RT PCR 11/04/2023 NEGATIVE   NEGATIVE Final   Comment: (NOTE) SARS-CoV-2 target nucleic acids are NOT DETECTED.  The SARS-CoV-2 RNA is generally detectable in upper respiratory specimens during the acute phase of infection. The lowest concentration of SARS-CoV-2 viral copies this assay can detect is 138 copies/mL. A negative result does not preclude SARS-Cov-2 infection and should not be used  as the sole basis for treatment or other patient management decisions. A negative result may occur with  improper specimen collection/handling, submission of specimen other than nasopharyngeal swab, presence of viral mutation(s) within the areas targeted by this assay, and inadequate number of viral copies(<138 copies/mL). A negative result must be combined with clinical observations, patient history, and epidemiological information. The expected result is Negative.  Fact Sheet for Patients:  BloggerCourse.com  Fact Sheet for Healthcare Providers:  SeriousBroker.it  This test is no                          t yet approved or cleared by the [place] FDA and  has been authorized for detection and/or diagnosis of SARS-CoV-2 by FDA under an Emergency Use Authorization (EUA). This EUA will remain  in effect (meaning this test can be used) for the duration of the COVID-19 declaration under Section 564(b)(1) of the Act, 21 U.S.C.section 360bbb-3(b)(1), unless the authorization is terminated  or revoked sooner.       Influenza A by PCR 11/04/2023 NEGATIVE  NEGATIVE Final   Influenza B by PCR 11/04/2023 NEGATIVE  NEGATIVE Final   Comment: (NOTE) The Xpert Xpress SARS-CoV-2/FLU/RSV plus assay is intended as an aid in the diagnosis of influenza from Nasopharyngeal swab specimens and should not be used as a sole basis for treatment. Nasal washings and aspirates are unacceptable for Xpert Xpress SARS-CoV-2/FLU/RSV testing.  Fact Sheet for  Patients: BloggerCourse.com  Fact Sheet for Healthcare Providers: SeriousBroker.it  This test is not yet approved or cleared by the [place] FDA and has been authorized for detection and/or diagnosis of SARS-CoV-2 by FDA under an Emergency Use Authorization (EUA). This EUA will remain in effect (meaning this test can be used) for the duration of the COVID-19 declaration under Section 564(b)(1) of the Act, 21 U.S.C. section 360bbb-3(b)(1), unless the authorization is terminated or revoked.     Resp Syncytial Virus by PCR 11/04/2023 NEGATIVE  NEGATIVE Final   Comment: (NOTE) Fact Sheet for Patients: BloggerCourse.com  Fact Sheet for Healthcare Providers: SeriousBroker.it  This test is not yet approved or cleared by the [place] FDA and has been authorized for detection and/or diagnosis of SARS-CoV-2 by FDA under an Emergency Use Authorization (EUA). This EUA will remain in effect (meaning this test can be used) for the duration of the COVID-19 declaration under Section 564(b)(1) of the Act, 21 U.S.C. section 360bbb-3(b)(1), unless the authorization is terminated or revoked.  Performed at [facility], [address] [place], [place] [address]    Glucose-Capillary 11/04/2023 171 (H)  70 - 99 mg/dL Final   Glucose reference range applies only to samples taken after fasting for at least 8 hours.  Admission on 06/28/2023, Discharged on 06/28/2023  Component Date Value Ref Range Status   Glucose-Capillary 06/28/2023 81  70 - 99 mg/dL Final   Glucose reference range applies only to samples taken after fasting for at least 8 hours.   Comment 1 06/28/2023 Notify RN   Final   WBC 06/28/2023 9.5  4.0 - 10.5 K/uL Final   RBC 06/28/2023 4.29  4.22 - 5.81 MIL/uL Final   Hemoglobin 06/28/2023 12.5 (L)  13.0 - 17.0 g/dL Final   HCT 60/45/4098 36.3 (L)  39.0 - 52.0  % Final   MCV 06/28/2023 84.6  80.0 - 100.0 fL Final   MCH 06/28/2023 29.1  26.0 - 34.0 pg Final   MCHC 06/28/2023 34.4  30.0 - 36.0 g/dL Final   RDW 16/05/9603 13.0  11.5 - 15.5 % Final   Platelets 06/28/2023 374  150 - 400 K/uL Final   nRBC 06/28/2023 0.0  0.0 - 0.2 % Final   Neutrophils Relative % 06/28/2023 70  % Final   Neutro Abs 06/28/2023 6.8  1.7 - 7.7 K/uL Final   Lymphocytes Relative 06/28/2023 20  % Final   Lymphs Abs 06/28/2023 1.9  0.7 - 4.0 K/uL Final   Monocytes Relative 06/28/2023 7  % Final   Monocytes Absolute 06/28/2023 0.6  0.1 - 1.0 K/uL Final   Eosinophils Relative 06/28/2023 1  % Final   Eosinophils Absolute 06/28/2023 0.1  0.0 - 0.5 K/uL Final   Basophils Relative 06/28/2023 1  % Final   Basophils Absolute 06/28/2023 0.1  0.0 - 0.1 K/uL Final   Immature Granulocytes 06/28/2023 1  % Final   Abs Immature Granulocytes 06/28/2023 0.05  0.00 - 0.07 K/uL Final   Performed at [facility], [address] [place], [place] [address]   Sodium 06/28/2023 135  135 - 145 mmol/L Final   Potassium 06/28/2023 4.5  3.5 - 5.1 mmol/L Final   Chloride 06/28/2023 100  98 - 111 mmol/L Final   CO2 06/28/2023 26  22 - 32 mmol/L Final   Glucose, Bld 06/28/2023 83  70 - 99 mg/dL Final   Glucose reference range applies only to samples taken after fasting for at least 8 hours.   BUN 06/28/2023 38 (H)  6 - 20 mg/dL Final   Creatinine, Ser 06/28/2023 1.11  0.61 - 1.24 mg/dL Final   Calcium 11/91/4782 9.2  8.9 - 10.3 mg/dL Final   Total Protein 95/62/1308 7.4  6.5 - 8.1 g/dL Final   Albumin 65/78/4696 4.0  3.5 - 5.0 g/dL Final   AST 29/52/8413 22  15 - 41 U/L Final   ALT 06/28/2023 20  0 - 44 U/L Final   Alkaline Phosphatase 06/28/2023 81  38 - 126 U/L Final   Total Bilirubin 06/28/2023 0.3  <1.2 mg/dL Final   GFR, Estimated 06/28/2023 >60  >60 mL/min Final   Comment: (NOTE) Calculated using the CKD-EPI Creatinine Equation (2021)    Anion gap 06/28/2023 9  5 - 15 Final    Performed at [facility], [address] [place], [place] [address]    Blood Alcohol level:  Lab Results  Component Value Date   [facility] <10 11/03/2023   ETH <10 09/17/2022    Metabolic Disorder Labs: Lab Results  Component Value Date   HGBA1C 8.7 (H) 11/07/2023   MPG 202.99 11/07/2023   MPG 217.34 09/17/2022   No results found for: "PROLACTIN" Lab Results  Component Value Date   CHOL 153 09/17/2022   TRIG 58 09/17/2022   HDL 71 09/17/2022   CHOLHDL 2.2 09/17/2022   VLDL 12 09/17/2022   LDLCALC 70 09/17/2022    Therapeutic Lab Levels: No results found for: "LITHIUM" No results found for: "VALPROATE" No results found for: "CBMZ"  Physical Findings   PHQ2-9    Flowsheet Row ED from 11/06/2023 in [facility]  PHQ-2 Total Score 0  PHQ-9 Total Score 0      Flowsheet Row ED from 11/06/2023 in [facility] ED from 11/03/2023 in [facility] Emergency Department at [facility] ED from 07/18/2023 in [facility] Emergency Department at [facility]  C-SSRS RISK CATEGORY No Risk No Risk  No Risk        Psychiatric Specialty Exam: Physical Exam Constitutional:      Appearance: the patient is not toxic-appearing.  Pulmonary:     Effort: Pulmonary effort is normal.  Neurological:     General: No focal deficit present.     Mental Status: the patient is alert and oriented to person, place, and time.   Review of Systems  Respiratory:  Negative for shortness of breath.   Cardiovascular:  Negative for chest pain.  Gastrointestinal:  Negative for abdominal pain, constipation, diarrhea, nausea and vomiting.  Neurological:  Negative for headaches.      BP (!) 147/83 (BP Location: Right Arm) Comment: RN Notified  Pulse 65   Temp 98.2 F (36.8 C) (Oral)   Resp 19   SpO2 100%   General Appearance: Fairly Groomed  Eye Contact:  Good  Speech:  Clear and Coherent  Volume:  Normal  Mood:   "fine"  Affect:  Congruent  Thought Process:  Coherent  Orientation:  Full (Time, Place, and Person)  Thought Content: Logical   Suicidal Thoughts:  No  Homicidal Thoughts:  No  Memory:  Immediate;   Good  Judgement:  fair  Insight:  fair  Psychomotor Activity:  Normal  Concentration:  Concentration: Good  Recall:  Good  Fund of Knowledge: Good  Language: Good  Akathisia:  No  Handed:  not assessed  AIMS (if indicated): not done  Assets:  Communication Skills Desire for Improvement Leisure Time Physical Health  ADL's:  Intact  Cognition: WNL  Sleep:  Fair    Treatment Plan Summary: Daily contact with patient to assess and evaluate symptoms and progress in treatment and Medication management.  Stimulant induced psychosis, improving - Continue on Invega 6 mg today, he denies any side effect from [place]   -EKG results discussed with Dr. Tenny Craw cardiologist, she cleared patient to get Gean Birchwood IM injections  - Recommend residential rehab.  Social worker has referred patient to [facility], and [facility]  Continue on  Suboxone, will continue this medication.   Lewanda Rife, MD

## 2023-11-10 NOTE — ED Notes (Signed)
Patient observed resting quietly, eyes closed. Respirations equal and unlabored. Will continue to monitor for safety.  

## 2023-11-10 NOTE — Group Note (Signed)
 Group Topic: Balance in Life  Group Date: 11/10/2023 Start Time: 1105 End Time: 1215 Facilitators: Vicki Mallet, NT  Department: [facility]  Number of Participants: 9  Group Focus: check in and diet Treatment Modality:  Psychoeducation Interventions utilized were patient education Purpose: reinforce self-care  Name: Tanner [name] Date of Birth: [birth year]  MR: [id number]    Level of Participation: active Quality of Participation: attentive Interactions with others: gave feedback Mood/Affect: appropriate Triggers (if applicable): none Cognition: coherent/clear Progress: Moderate Response: Pt shared his best quality is to give other people love. Pt shared about healthy eating during the diet discussion.  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   Constipation 02/02/2022   Infective endocarditis of tricuspid valve 02/02/2022   MSSA bacteremia 01/26/2022   Septic arthritis of lumbar spine (HCC) 01/17/2022   Controlled type 2 diabetes mellitus without complication, without long-term current use of insulin (HCC) 01/17/2022   Hypocalcemia 01/17/2022   Normocytic anemia 01/17/2022   Hyponatremia 01/17/2022   Hypomagnesemia 01/17/2022   Cellulitis of right foot 05/17/2019   Tobacco abuse    Opioid dependence, uncomplicated (HCC) 02/24/2018   Periodontal disease 10/11/2017   Hepatitis C antibody positive in blood 10/05/2017    Elevated platelet count 10/05/2017   History of substance abuse (HCC) 09/20/2017   History of vitamin D deficiency 09/20/2017   Essential hypertension 09/20/2017

## 2023-11-10 NOTE — ED Notes (Signed)
Pt in bed at this hour. No apparent distress. RR even and unlabored. Monitored for safety.

## 2023-11-10 NOTE — ED Notes (Signed)
 Pt is in the bedroom composed.concerned about not falling asleep. Meds given.NAD, Environment secured per facility policy. Respirations even and unlabored. Will continue to monitor for safety.

## 2023-11-10 NOTE — ED Notes (Signed)
 Patient A&Ox4. Has been calm, cooperative, and appropriate with peers. He denies intent to harm self/others. Denies A/VH. Patient denies any physical pain or discomfort. No acute distress observed. Routine safety checks conducted according to facility protocol. Patient agreed to notify staff should thoughts of harm toward self or others arise. We will continue to monitor for safety.

## 2023-11-10 NOTE — Discharge Planning (Signed)
 LCSW spoke with patient on yesterday who reported he does not want to go to residential placement at this time.  Patient reports he has to get back to work and only came to the [facility] in order to receive his long-acting injectable.  Patient reports that if he is not going to receive his long-acting injectable then he would like to be discharged.  LCSW was able to provide brief supportive counseling to the patient and encouraged him to remain overnight until MD was able to meet with the patient regarding his needs.  Patient was agreeable to plan.  Patient reports he would like to be discharged on today, however understands that we are waiting on an update from the MD.  Once update has been provided, patient will be informed. LCSW will continue to follow and provide support to patient while on [facility] unit.   Fernande Boyden, LCSW Clinical Social Worker [facility] Ph: [phone]

## 2023-11-10 NOTE — ED Notes (Signed)
 Pt is in the dayroom composed and pleasant watching TV with other patients. Denies SI/HI/AVH. NAD Will monitor for safety.

## 2023-11-10 NOTE — Group Note (Signed)
 Group Topic: Spirituality in Recovery  Group Date: 11/10/2023 Start Time: 1238 End Time: 1418 Facilitators: Loleta Dicker, LCSW  Department: [facility]  Number of Participants: 7  Group Focus: affirmation, check in, clarity of thought, communication, daily focus, feeling awareness/expression, personal responsibility, problem solving, relapse prevention, safety plan, and self-awareness Treatment Modality:  Behavior Modification Therapy, Cognitive Behavioral Therapy, and Spiritual Interventions utilized were story telling and support Purpose: enhance coping skills, explore maladaptive thinking, express feelings, express irrational fears, improve communication skills, increase insight, regain self-worth, reinforce self-care, relapse prevention strategies, and trigger / craving management  Name: Tanner [name] Date of Birth: 10-04-1965  MR: [id number]    Level of Participation: active Quality of Participation: attentive and cooperative Interactions with others: gave feedback Mood/Affect: appropriate Triggers (if applicable): N/A Cognition: coherent/clear and goal directed Progress: Gaining insight Description of Group:  This group will address the importance of considering the journey and not just the destination. Patients will be encouraged to process areas in their lives where they found it hard to focus on the good rather than the bad, and identify reasons for maintaining such thought pattern. Facilitator will guide patients utilizing problem- solving interventions to address and improve the way each patient views themselves and their situation. Patients will work through understanding and applying humility when it comes to life changes and the interactions we have with others. Patients will be encouraged to explore ways that they will move forward throughout life's journey, and make healthier decisions for themselves.   Therapeutic Goals: Patient will identify two  or more emotions or situations that they observed or felt throughout watching the video clip. Patient will identify signs where they may have responded to someone or situation due to their current circumstance.  Patient will identify two ways to set better habits in order to achieve more peace in their lives. Patient will demonstrate ability to communicate their needs through discussion and/or role plays.  Response: Patient actively participated in group on today. Patient reports feeling encouraged from watching the video, and his was able to provide feedback to staff and peers. Patient reports he enjoyed hearing the experiences of others and the advice provided to one another.  Plan: referral / recommendations  Patients Problems:  Patient Active Problem List   Diagnosis Date Noted   Paranoia (psychosis) (HCC) 11/06/2023   Generalized anxiety disorder 11/03/2023   Paranoia (HCC) 11/03/2023   Stimulant-induced psychotic disorder with delusions (HCC) 11/03/2023   Delusion (HCC) 02/10/2022   Acute low back pain    Osteomyelitis (HCC)    Paraspinal abscess (HCC)    IVDU (intravenous drug user) 02/02/2022   Osteomyelitis of lumbar spine (HCC) 02/02/2022   Chronic pain 02/02/2022   Insomnia 02/02/2022   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   Constipation 02/02/2022   Infective endocarditis of tricuspid valve 02/02/2022   MSSA bacteremia 01/26/2022   Septic arthritis of lumbar spine (HCC) 01/17/2022   Controlled type 2 diabetes mellitus without complication, without long-term current use of insulin (HCC) 01/17/2022   Hypocalcemia 01/17/2022   Normocytic anemia 01/17/2022   Hyponatremia 01/17/2022   Hypomagnesemia 01/17/2022   Cellulitis of right foot 05/17/2019   Tobacco abuse    Opioid dependence, uncomplicated (HCC) 02/24/2018   Periodontal disease 10/11/2017   Hepatitis C antibody positive in blood 10/05/2017   Elevated platelet count 10/05/2017   History of substance  abuse (HCC) 09/20/2017   History of vitamin D deficiency 09/20/2017   Essential hypertension 09/20/2017

## 2023-11-10 NOTE — Group Note (Signed)
 Group Topic: Recovery Basics  Group Date: 11/10/2023 Start Time: 2000 End Time: 2057 Facilitators: Rae Lips B  Department: [facility]  Number of Participants: 4  Group Focus: check in, community group, coping skills, daily focus, and depression Treatment Modality:  Individual Therapy Interventions utilized were leisure development, story telling, and support Purpose: express feelings, express irrational fears, and relapse prevention strategies  Name: Tanner [name] Date of Birth: July 07, 1966  MR: [id number]    Level of Participation: active Quality of Participation: cooperative Interactions with others: gave feedback Mood/Affect: appropriate Triggers (if applicable): NA Cognition: coherent/clear Progress: Gaining insight Response: NA Plan: patient will be encouraged to keep going to groups.   Patients Problems:  Patient Active Problem List   Diagnosis Date Noted   Paranoia (psychosis) (HCC) 11/06/2023   Generalized anxiety disorder 11/03/2023   Paranoia (HCC) 11/03/2023   Stimulant-induced psychotic disorder with delusions (HCC) 11/03/2023   Delusion (HCC) 02/10/2022   Acute low back pain    Osteomyelitis (HCC)    Paraspinal abscess (HCC)    IVDU (intravenous drug user) 02/02/2022   Osteomyelitis of lumbar spine (HCC) 02/02/2022   Chronic pain 02/02/2022   Insomnia 02/02/2022   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   Constipation 02/02/2022   Infective endocarditis of tricuspid valve 02/02/2022   MSSA bacteremia 01/26/2022   Septic arthritis of lumbar spine (HCC) 01/17/2022   Controlled type 2 diabetes mellitus without complication, without long-term current use of insulin (HCC) 01/17/2022   Hypocalcemia 01/17/2022   Normocytic anemia 01/17/2022   Hyponatremia 01/17/2022   Hypomagnesemia 01/17/2022   Cellulitis of right foot 05/17/2019   Tobacco abuse    Opioid dependence, uncomplicated (HCC) 02/24/2018   Periodontal disease  10/11/2017   Hepatitis C antibody positive in blood 10/05/2017   Elevated platelet count 10/05/2017   History of substance abuse (HCC) 09/20/2017   History of vitamin D deficiency 09/20/2017   Essential hypertension 09/20/2017

## 2023-11-11 DIAGNOSIS — F1510 Other stimulant abuse, uncomplicated: Secondary | ICD-10-CM | POA: Diagnosis not present

## 2023-11-11 DIAGNOSIS — E119 Type 2 diabetes mellitus without complications: Secondary | ICD-10-CM | POA: Diagnosis not present

## 2023-11-11 DIAGNOSIS — I4519 Other right bundle-branch block: Secondary | ICD-10-CM | POA: Diagnosis not present

## 2023-11-11 LAB — GLUCOSE, CAPILLARY
Glucose-Capillary: 301 mg/dL — ABNORMAL HIGH (ref 70–99)
Glucose-Capillary: 235 mg/dL — ABNORMAL HIGH (ref 70–99)

## 2023-11-11 MED ORDER — HYDROXYZINE HCL 25 MG PO TABS: 25.0000 mg | ORAL_TABLET | Freq: Three times a day (TID) | ORAL | 0 refills | Status: DC | PRN

## 2023-11-11 MED ORDER — BUPRENORPHINE HCL-NALOXONE HCL 8-2 MG SL SUBL: 1.0000 | SUBLINGUAL_TABLET | Freq: Two times a day (BID) | SUBLINGUAL | 0 refills | Status: AC

## 2023-11-11 MED ORDER — INVEGA SUSTENNA 156 MG/ML IM SUSY: 156.0000 mg | PREFILLED_SYRINGE | Freq: Once | INTRAMUSCULAR | 0 refills | Status: DC

## 2023-11-11 MED ORDER — NICOTINE POLACRILEX 2 MG MT GUM: 2.0000 mg | CHEWING_GUM | OROMUCOSAL | 0 refills | Status: DC | PRN

## 2023-11-11 MED ORDER — PALIPERIDONE PALMITATE ER 234 MG/1.5ML IM SUSY: 156.0000 mg | PREFILLED_SYRINGE | Freq: Once | INTRAMUSCULAR | 0 refills | Status: DC

## 2023-11-11 MED ORDER — PALIPERIDONE PALMITATE ER 234 MG/1.5ML IM SUSY: 156.0000 mg | PREFILLED_SYRINGE | Freq: Once | INTRAMUSCULAR | Status: DC

## 2023-11-11 MED ORDER — DOCUSATE SODIUM 100 MG PO CAPS: 100.0000 mg | ORAL_CAPSULE | Freq: Two times a day (BID) | ORAL | 0 refills | Status: AC

## 2023-11-11 NOTE — ED Notes (Signed)
 Pt is in the bedroom calm and composed.NAD. Respirations even and unlabored. Will continue to monitor for safety

## 2023-11-11 NOTE — ED Provider Notes (Signed)
 [facility]/OBS ASAP Discharge Summary  Date and Time: 11/11/2023 10:36 AM  Name: Tanner [name]  MRN:  [id number]   Discharge Diagnoses:  Final diagnoses:  Methamphetamine abuse [facility])   The patient is a 58 year old male with a history of methamphetamine and marijuana abuse. Over the past year he has been admitted to [facility] on 2 occasions, both of which appear to be related to stimulant induced psychosis. At the last hospitalization (in November 2024), the patient received Gean Birchwood. On the present occasion, 11/03/2023, the patient presented to [facility] long ED requesting an [place] shot and was noted to be experiencing psychotic symptoms. He was transferred to the facility based crisis for further  treatment   Subjective: Chart reviewed, case discussed with Child psychotherapist and staff.  Patient was assessed prior to discharge.  Patient reported good mood.  He was excited to go home today.  Patient was encouraged to take medicine as prescribed.  Patient was given instructions for walk-in clinic.  He was advised to get registered and see a provider prior to getting Invega Sustenna 156 mg IM on November 17, 2023.  Patient verbalized understanding of the instructions.  He denied psychotic or manic symptoms.  He denies thoughts of harming himself or others.  Stay Summary: Patient was not admitted to facility based crisis center due to drug use and psychosis.  While on the unit patient was started on Invega 3 mg p.o. daily which was then increased to 6 mg p.o. daily.  Patient was given Invega Sustenna 234 mg IM on 11/10/2023.  He tolerated medicine without any side effects.  Next dose of 156 mg IM due on November 17, 2023.    It was noted that patient's EKG done on 11/03/2023 had some abnormal findings: Incomplete right bundle branch block Minimal voltage criteria for LVH, may be normal variant ( R in aVL ) Septal infarct , age undetermined Abnormal ECG When compared with ECG of  28-Jun-2023 17:01, PREVIOUS ECG IS PRESENT  EKG was repeated on  11/09/2023 and 11/10/2023.  The results were interpreted by Dr. Tenny Craw, cardiologist on 11/10/2023.  Dr. Tenny Craw shared that the abnormalities in the EKG was  "computer analyzed".  She cleared patient to get [place] IM.  Patient was given Gean Birchwood IM 235 mg on 11/10/2023.   Total Time spent with patient: 45 minutes  Past Psychiatric History: Patient reports history of schizoaffective disorder with multiple hospitalizations.  He has been on Remeron, Risperdal, Abilify, and Invega in the past  Past Medical History: Patient reports that he was on metformin in the past, he lost weight and he does not take metformin anymore.  He refused insulin prescription, he said" I want to take insulin".  Patient was encouraged to follow-up with primary care physician for medical needs.  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 Tobacco Cessation:  A prescription for an FDA-approved tobacco cessation medication provided at discharge  Current Medications:  Current Facility-Administered Medications  Medication Dose Route Frequency Provider Last Rate Last Admin   acetaminophen (TYLENOL) tablet 650 mg  650 mg Oral Q6H PRN Jearld Lesch, NP       alum & mag hydroxide-simeth (MAALOX/MYLANTA) 200-200-20 MG/5ML suspension 30 mL  30 mL Oral Q4H PRN Dixon, Rashaun M, NP       buprenorphine-naloxone (SUBOXONE) 8-2 mg per SL tablet 1 tablet  1 tablet  Sublingual BID Jearld Lesch, NP   1 tablet at 11/11/23 0940   haloperidol (HALDOL) tablet 5 mg  5 mg Oral TID PRN Jearld Lesch, NP   5 mg at 11/09/23 1744   And   diphenhydrAMINE (BENADRYL) capsule 50 mg  50 mg Oral TID PRN Jearld Lesch, NP   50 mg at 11/10/23 2338   haloperidol lactate (HALDOL) injection 5 mg  5 mg Intramuscular TID PRN Jearld Lesch, NP       And    diphenhydrAMINE (BENADRYL) injection 50 mg  50 mg Intramuscular TID PRN Jearld Lesch, NP       And   LORazepam (ATIVAN) injection 2 mg  2 mg Intramuscular TID PRN Jearld Lesch, NP       haloperidol lactate (HALDOL) injection 10 mg  10 mg Intramuscular TID PRN Jearld Lesch, NP       And   diphenhydrAMINE (BENADRYL) injection 50 mg  50 mg Intramuscular TID PRN Jearld Lesch, NP       And   LORazepam (ATIVAN) injection 2 mg  2 mg Intramuscular TID PRN Jearld Lesch, NP       docusate sodium (COLACE) capsule 100 mg  100 mg Oral BID Lewanda Rife, MD   100 mg at 11/11/23 0940   hydrOXYzine (ATARAX) tablet 25 mg  25 mg Oral TID PRN Jearld Lesch, NP   25 mg at 11/10/23 2338   insulin aspart (novoLOG) injection 0-15 Units  0-15 Units Subcutaneous TID WC Sindy Guadeloupe, NP   5 Units at 11/11/23 1610   insulin aspart (novoLOG) injection 0-5 Units  0-5 Units Subcutaneous QHS Sindy Guadeloupe, NP   4 Units at 11/10/23 2150   magnesium hydroxide (MILK OF MAGNESIA) suspension 30 mL  30 mL Oral Daily PRN Jearld Lesch, NP       nicotine polacrilex (NICORETTE) gum 2 mg  2 mg Oral PRN Myriam Forehand, NP   2 mg at 11/11/23 0947   [START ON 11/17/2023] paliperidone (INVEGA SUSTENNA) injection 156 mg  156 mg Intramuscular Once Lewanda Rife, MD       paliperidone (INVEGA) 24 hr tablet 3 mg  3 mg Oral Daily Lewanda Rife, MD   3 mg at 11/11/23 0940   traZODone (DESYREL) tablet 50 mg  50 mg Oral QHS PRN Jearld Lesch, NP   50 mg at 11/10/23 2139   Current Outpatient Medications  Medication Sig Dispense Refill   buprenorphine-naloxone (SUBOXONE) 8-2 mg SUBL SL tablet Place 1 tablet under the tongue 2 (two) times daily. 30 tablet 0   docusate sodium (COLACE) 100 MG capsule Take 1 capsule (100 mg total) by mouth 2 (two) times daily. 10 capsule 0   hydrOXYzine (ATARAX) 25 MG tablet Take 1 tablet (25 mg total) by mouth 3 (three) times daily as needed for anxiety. 30 tablet 0   nicotine  polacrilex (NICORETTE) 2 MG gum Take 1 each (2 mg total) by mouth as needed for smoking cessation. 100 tablet 0   [START ON 11/17/2023] paliperidone (INVEGA SUSTENNA) 234 MG/1.5ML injection Inject 156 mg into the muscle once for 1 dose. 1 mL 0    PTA Medications:  Facility Ordered Medications  Medication   acetaminophen (TYLENOL) tablet 650 mg   alum & mag hydroxide-simeth (MAALOX/MYLANTA) 200-200-20 MG/5ML suspension 30 mL   magnesium hydroxide (MILK OF MAGNESIA) suspension 30 mL   haloperidol (HALDOL) tablet 5 mg   And  diphenhydrAMINE (BENADRYL) capsule 50 mg   haloperidol lactate (HALDOL) injection 5 mg   And   diphenhydrAMINE (BENADRYL) injection 50 mg   And   LORazepam (ATIVAN) injection 2 mg   haloperidol lactate (HALDOL) injection 10 mg   And   diphenhydrAMINE (BENADRYL) injection 50 mg   And   LORazepam (ATIVAN) injection 2 mg   hydrOXYzine (ATARAX) tablet 25 mg   traZODone (DESYREL) tablet 50 mg   buprenorphine-naloxone (SUBOXONE) 8-2 mg per SL tablet 1 tablet   [COMPLETED] paliperidone (INVEGA) 24 hr tablet 3 mg   Followed by   [COMPLETED] paliperidone (INVEGA) 24 hr tablet 6 mg   docusate sodium (COLACE) capsule 100 mg   insulin aspart (novoLOG) injection 0-15 Units   insulin aspart (novoLOG) injection 0-5 Units   nicotine polacrilex (NICORETTE) gum 2 mg   [COMPLETED] paliperidone (INVEGA SUSTENNA) injection 234 mg   paliperidone (INVEGA) 24 hr tablet 3 mg   [START ON 11/17/2023] paliperidone (INVEGA SUSTENNA) injection 156 mg   PTA Medications  Medication Sig   buprenorphine-naloxone (SUBOXONE) 8-2 mg SUBL SL tablet Place 1 tablet under the tongue 2 (two) times daily.   hydrOXYzine (ATARAX) 25 MG tablet Take 1 tablet (25 mg total) by mouth 3 (three) times daily as needed for anxiety.   nicotine polacrilex (NICORETTE) 2 MG gum Take 1 each (2 mg total) by mouth as needed for smoking cessation.   docusate sodium (COLACE) 100 MG capsule Take 1 capsule (100 mg total) by  mouth 2 (two) times daily.   [START ON 11/17/2023] paliperidone (INVEGA SUSTENNA) 234 MG/1.5ML injection Inject 156 mg into the muscle once for 1 dose.       11/11/2023   10:27 AM 11/10/2023   11:08 AM 11/09/2023    2:31 PM  Depression screen PHQ 2/9  Decreased Interest 0 0 2  Down, Depressed, Hopeless 0 0 2  PHQ - 2 Score 0 0 4  Altered sleeping 0 0 2  Tired, decreased energy 0 0 2  Change in appetite 0 0 2  Feeling bad or failure about yourself  0 0 2  Trouble concentrating 0 0 2  Moving slowly or fidgety/restless 0 0 2  Suicidal thoughts 0 0 1  PHQ-9 Score 0 0 17  Difficult doing work/chores Not difficult at all Not difficult at all     [facility] ED from 11/06/2023 in [facility] ED from 11/03/2023 in [facility] Emergency Department at [facility] ED from 07/18/2023 in [facility] Emergency Department at [facility]  C-SSRS RISK CATEGORY No Risk No Risk No Risk       Musculoskeletal  Strength & Muscle Tone: within normal limits Gait & Station: normal Patient leans: N/A  Psychiatric Specialty Exam   General Appearance: Fairly Groomed  Eye Contact:  Good  Speech:  Clear and Coherent  Volume:  Normal  Mood:  "good"  Affect:  Congruent, animated and full range  Thought Process:  Coherent  Orientation:  Full (Time, Place, and Person)  Thought Content: Logical and improved  Suicidal Thoughts:  No  Homicidal Thoughts:  No  Memory:  Immediate;   Good  Judgement:  fair  Insight:  fair  Psychomotor Activity:  Normal  Concentration:  Concentration: Good  Recall:  Good  Fund of Knowledge: Good  Language: Good  Akathisia:  No  Handed:  not assessed  AIMS (if indicated): not done  Assets:  Communication Skills Desire for Improvement Leisure Time Physical  Health  ADL's:  Intact  Cognition: WNL  Sleep:  Fair    Physical Exam  Physical Exam Constitutional:      Appearance: Normal appearance.  HENT:     Head:  Normocephalic and atraumatic.     Nose: No congestion.  Eyes:     Pupils: Pupils are equal, round, and reactive to light.  Cardiovascular:     Rate and Rhythm: Regular rhythm.  Pulmonary:     Effort: Pulmonary effort is normal.  Skin:    General: Skin is warm.  Neurological:     General: No focal deficit present.     Mental Status: He is alert and oriented to person, place, and time.    Review of Systems  Constitutional:  Negative for chills and fever.  HENT:  Negative for hearing loss and sore throat.   Eyes:  Negative for blurred vision and double vision.  Respiratory:  Negative for cough and shortness of breath.   Cardiovascular:  Negative for chest pain and palpitations.  Gastrointestinal:  Negative for nausea and vomiting.  Neurological:  Negative for dizziness and speech change.  Psychiatric/Behavioral:  Positive for substance abuse. Negative for depression, hallucinations and suicidal ideas. The patient is not nervous/anxious.    Blood pressure (!) 144/80, pulse 66, temperature 98.1 F (36.7 C), temperature source Oral, resp. rate 18, SpO2 98%. There is no height or weight on file to calculate BMI.  Demographic Factors:  Male, Caucasian, and Living alone  Loss Factors: Financial problems/change in socioeconomic status  Historical Factors: Impulsivity  Risk Reduction Factors:   Religious beliefs about death, Employed, and Positive therapeutic relationship  Continued Clinical Symptoms:  Alcohol/Substance Abuse/Dependencies Previous Psychiatric Diagnoses and Treatments  Cognitive Features That Contribute To Risk:  Thought constriction (tunnel vision)    Suicide Risk:  Minimal: No identifiable suicidal ideation.    Plan Of Care/Follow-up recommendations:  Activity:  As tolerated Diet:  Diabetic and cardiac healthy He was advised to get registered and see a provider at walk-in clinic prior to getting Invega Sustenna 156 mg IM on November 17, 2023.  Patient  verbalized understanding of the instructions.  Patient was encouraged to be compliant with treatment.  Disposition: Home  Lewanda Rife, MD

## 2023-11-11 NOTE — ED Notes (Signed)
BGL 235

## 2023-11-11 NOTE — ED Notes (Signed)
 Pt leaving now. MD and SW communicated with pt, got an appointment set up for next week. SW filled out taxi voucher, [employer] called by [employer] fundraiser. Pt given med scripts and AVS. Survey completed. Pt has no s/sx of distress or concerns upon dc. Belongings returned. No further concerns.

## 2023-11-11 NOTE — ED Notes (Signed)
 Pt resting in room, resp even and unlabored. No s/sx of distress. No concerns at this time.

## 2023-11-17 ENCOUNTER — Ambulatory Visit (INDEPENDENT_AMBULATORY_CARE_PROVIDER_SITE_OTHER)

## 2023-11-17 ENCOUNTER — Ambulatory Visit (INDEPENDENT_AMBULATORY_CARE_PROVIDER_SITE_OTHER): Admitting: Psychiatry

## 2023-11-17 ENCOUNTER — Encounter (HOSPITAL_COMMUNITY): Payer: Self-pay | Admitting: Psychiatry

## 2023-11-17 VITALS — BP 156/79 | HR 89 | Ht 72.0 in | Wt 184.0 lb

## 2023-11-17 DIAGNOSIS — F15950 Other stimulant use, unspecified with stimulant-induced psychotic disorder with delusions: Secondary | ICD-10-CM | POA: Diagnosis not present

## 2023-11-17 DIAGNOSIS — F1994 Other psychoactive substance use, unspecified with psychoactive substance-induced mood disorder: Secondary | ICD-10-CM

## 2023-11-17 DIAGNOSIS — F1290 Cannabis use, unspecified, uncomplicated: Secondary | ICD-10-CM | POA: Diagnosis not present

## 2023-11-17 DIAGNOSIS — F1120 Opioid dependence, uncomplicated: Secondary | ICD-10-CM

## 2023-11-17 DIAGNOSIS — F1521 Other stimulant dependence, in remission: Secondary | ICD-10-CM

## 2023-11-17 DIAGNOSIS — F411 Generalized anxiety disorder: Secondary | ICD-10-CM

## 2023-11-17 DIAGNOSIS — F17200 Nicotine dependence, unspecified, uncomplicated: Secondary | ICD-10-CM

## 2023-11-17 MED ORDER — PALIPERIDONE PALMITATE ER 156 MG/ML IM SUSY
156.0000 mg | PREFILLED_SYRINGE | Freq: Once | INTRAMUSCULAR | Status: AC
  Administered 2023-11-17: 156 mg via INTRAMUSCULAR

## 2023-11-17 MED ORDER — INVEGA SUSTENNA 156 MG/ML IM SUSY: 156.0000 mg | PREFILLED_SYRINGE | INTRAMUSCULAR | 1 refills | Status: DC

## 2023-11-17 MED ORDER — NICOTINE POLACRILEX 2 MG MT GUM: 2.0000 mg | CHEWING_GUM | OROMUCOSAL | 3 refills | Status: AC | PRN

## 2023-11-17 MED ORDER — HYDROXYZINE HCL 25 MG PO TABS
25.0000 mg | ORAL_TABLET | Freq: Three times a day (TID) | ORAL | 3 refills | Status: DC | PRN
Start: 2023-11-17 — End: 2024-05-29

## 2023-11-17 NOTE — Progress Notes (Signed)
 Psychiatric Initial Adult Assessment   Patient Identification: Tanner [name] MRN:  [id number] Date of Evaluation:  11/17/2023 Referral Source: [facility] Chief Complaint:  " When I take the injection a switch goes off and I am mellowed out" Per his friend Jonny Ruiz "He is better on the shot"  Visit Diagnosis:    ICD-10-CM   1. Methamphetamine use disorder, moderate, in early remission, dependence (HCC)  F15.21     2. Marijuana use  F12.90     3. Substance induced mood disorder (HCC)  F19.94 paliperidone (INVEGA SUSTENNA) 156 MG/ML SUSY injection    4. Anxiety state  F41.1 hydrOXYzine (ATARAX) 25 MG tablet    5. Tobacco dependence  F17.200 nicotine polacrilex (NICORETTE) 2 MG gum      History of Present Illness: 65 male seen today for initial psychiatric evaluation.  He was referred to outpatient psychiatry by [facility] where he presented on 11/06/2023 through 11/11/2023.  Per chart review patient presented for methamphetamine abuse and stimulant induced psychosis.  He has a psychiatric history of polysubstance use (meth, marijuana, heroin, tobacco), past history of schizophrenia, anxiety, insomnia, and paranoia.  Patient was given Invega 234 mg on December 11, 2023.  He was given for Invega 156 mg today (11/17/2023).  Patient reports that his medications are effective in managing his psychiatric conditions.  Patient informed writer that since his hospitalization he feels well.  He reports that he reports that he is generally noncompliant with oral medications but likes that his injection is monthly.  He reports that prior to being on medications he was irritable, distracted, having racing thoughts, fluctuations in mood, and some paranoia.  Patient does Archivist that he was under the influence of marijuana and methamphetamines.   Patient was seen with his friend Jonny Ruiz who notes that while at the under of the substances and not medicated patient was off the wall.  Provider asked patient his friend Jonny Ruiz to  elaborate.  He reports that he would hallucinate, experience paranoid about, felt like secret organizations were in his home, he also notes that he believed people were following/listening to him.  He informed Clinical research associate that he attempted to escape from these people.  He also describes the patient having visual hallucinations and seeing spirits.  Patient denies current hallucinations or paranoia today.  Patient reports that he continues to use methamphetamines and marijuana.  He first told provider that he has not used it since his hospitalization reports has used it.  He goes to Suboxone clinic for treatments monthly.  Provider informed patient that Invega LAI is not FDA approved for substance use issues.  Provider recommended that patient go to [facility] for recovery and detox.  He notes that he will consider it.  Provider informed patient that if he is reassessed once sober and presents with symptoms of schizophrenia or bipolar disorder his diagnoses can be adjusted and he can remain on Invega LAI.  Provider informed patient that if he is unable to maintain his sobriety Invega oral pills will have to be prescribed to help manage his mental health.  He endorsed understanding and agreed.  Provider spoke to Dr. Haze Rushing who recommended patient receive his maintenance dose of Invega 156 mg monthly.  Provider was agreeable to this and gave patient a sample today.  Ms. Loleta Chance was notified that the injection will not be covered by the patient's insurance and told her in the future the patient may have to be on oral Invega.  She endorsed understanding and agreed.  Patient given resources to [facility], [facility], and sons Annette Stable.  Provider also informed patient that if he needed more intensive care that he qualified for an ACT team.  Provider informed patient that she was unsure if the ACT teams would accept him and give him an LAI with his current history of substance use.  He endorsed understanding.  Provider gave  patient resources to envisions of life, strategic intervention, and Publishing rights manager.   Patient informed Clinical research associate that his anxiety and depression are well-managed.  Provider conducted a GAD-7.  Score 3.  Provider also conducted PHQ-9 and patient scored a 3.  He endorses adequate sleep and appetite.  Today he denies SI/HI/AVH, mania, paranoia.  Patient reports that he has neuropathy in his feet.  He quantifies his pain and 8 out of 10.  Currently he is not taking medications to help manage his pain.  Patient reports that the divorce between he and his ex-wife are somewhat traumatic.  He denies other traumatic events occurring.   Patient will continue his Nicorette gum and hydroxyzine as prescribed.  He will follow-up with the Suboxone clinic for substance use treatment.  Patients notes that he will consider going to [facility] for recovery/detox. No other concerns notes at this time.   Associated Signs/Symptoms: Depression Symptoms:  fatigue, difficulty concentrating, (Hypo) Manic Symptoms:   Notes prior to hospitalization/ medication experienced paranoia, irritability, VAH, racing thoughts and fluctuations in mood. Denies current symptoms of mania today.  Anxiety Symptoms:   Denies Psychotic Symptoms:   Denies PTSD Symptoms: Had a traumatic exposure:  Patient reports that the divorce between he and his ex-wife are somewhat traumatic.  He denies other traumatic events occurring  Past Psychiatric History: methamphetamine abuse and stimulant induced psychosis.  He has a psychiatric history of polysubstance use (meth, marijuana, heroin, tobacco), past history of schizophrenia, anxiety, insomnia, and paranoia  Previous Psychotropic Medications: Yes   Substance Abuse History in the last 12 months:  Yes.    Consequences of Substance Abuse: Medical Consequences:   High [facility] on 2 occasions, both of which appear to be related to stimulant induced psychosis. At the last  hospitalization (in November 2024). Most recent hospitalization 11/06/2023-11/11/2023  Past Medical History:  Past Medical History:  Diagnosis Date   Cellulitis of right foot 05/17/2019   Diabetes mellitus without complication (HCC)    Drug abuse (HCC)    Essential hypertension 09/20/2017   Formatting of this note might be different from the original. Stable with current therapy; continue  Last Assessment & Plan:  Formatting of this note might be different from the original.   Hepatitis C antibody positive in blood 10/05/2017   Formatting of this note might be different from the original. 10/05/17: ab +, RNA neg on 2/12 labs. Likely past infection that is resolved. F/u prn.   History of vitamin D deficiency 09/20/2017   Formatting of this note might be different from the original. 09/20/17: per pt; labs 1 wk; supplement prn.   Opioid dependence, uncomplicated (HCC) 02/24/2018   Formatting of this note might be different from the original. Self-medicating with buprenorphine Formatting of this note might be different from the original. Self-medicating with buprenorphine   Periodontal disease 10/11/2017   Formatting of this note might be different from the original. 10/11/17: refer to dental   Schizophrenia (HCC)    Tobacco abuse    Type 2 diabetes mellitus with hyperglycemia (HCC) 01/17/2022    Past Surgical History:  Procedure Laterality Date  SKIN GRAFT      Family Psychiatric History: Father dementia  Family History:  Family History  Problem Relation Age of Onset   Dementia Father    Diabetes Mellitus II Brother     Social History:   Social History   Socioeconomic History   Marital status: Legally Separated    Spouse name: Not on file   Number of children: Not on file   Years of education: Not on file   Highest education level: Not on file  Occupational History   Not on file  Tobacco Use   Smoking status: Every Day    Current packs/day: 1.00    Types: Cigarettes    Smokeless tobacco: Never  Vaping Use   Vaping status: Some Days   Substances: Nicotine  Substance and Sexual Activity   Alcohol use: Not Currently   Drug use: Yes    Types: IV, Methamphetamines    Comment: heroin   Sexual activity: Not on file  Other Topics Concern   Not on file  Social History Narrative   Not on file   Social Drivers of Health   Financial Resource Strain: Not on File (04/04/2018)   Received from [employer], [employer]    Financial Resource Strain: 0  Recent Concern: Physicist, medical Strain - Medium Risk (02/24/2018)   Received from [employer], [facility]   Overall Financial Resource Strain (CARDIA)    Difficulty of Paying Living Expenses: Somewhat hard  Food Insecurity: No Food Insecurity (11/06/2023)   Hunger Vital Sign    Worried About Running Out of Food in the Last Year: Never true    Ran Out of Food in the Last Year: Never true  Transportation Needs: No Transportation Needs (11/06/2023)   PRAPARE - Administrator, Civil Service (Medical): No    Lack of Transportation (Non-Medical): No  Physical Activity: Not on File (04/04/2018)   Received from [place], [place]   Physical Activity    Physical Activity: 0  Stress: Not on File (04/04/2018)   Received from [facility], [place]   Stress    Stress: 0  Social Connections: Unknown (12/26/2021)   Received from [facility], [facility]   Social Network    Social Network: Not on file    Additional Social History: Patient resides in [place].  He has been divorced for 3 years.  He has an older daughter provider.  Currently he is unemployed.  He endorses marijuana use and recent remission from methamphetamines.  He also smokes tobacco.  He denies alcohol use  Allergies:   Allergies  Allergen Reactions   Trazodone And Nefazodone Other (See Comments)    Dry mouth    Metabolic Disorder Labs: Lab Results  Component Value Date   HGBA1C 8.7 (H) 11/07/2023   MPG 202.99 11/07/2023    MPG 217.34 09/17/2022   No results found for: "PROLACTIN" Lab Results  Component Value Date   CHOL 153 09/17/2022   TRIG 58 09/17/2022   HDL 71 09/17/2022   CHOLHDL 2.2 09/17/2022   VLDL 12 09/17/2022   LDLCALC 70 09/17/2022   Lab Results  Component Value Date   TSH 1.301 09/17/2022    Therapeutic Level Labs: No results found for: "LITHIUM" No results found for: "CBMZ" No results found for: "VALPROATE"  Current Medications: Current Outpatient Medications  Medication Sig Dispense Refill   buprenorphine-naloxone (SUBOXONE) 8-2 mg SUBL SL tablet Place 1 tablet under the tongue 2 (two) times daily. 30 tablet 0  docusate sodium (COLACE) 100 MG capsule Take 1 capsule (100 mg total) by mouth 2 (two) times daily. 10 capsule 0   hydrOXYzine (ATARAX) 25 MG tablet Take 1 tablet (25 mg total) by mouth 3 (three) times daily as needed for anxiety. 90 tablet 3   nicotine polacrilex (NICORETTE) 2 MG gum Take 1 each (2 mg total) by mouth as needed for smoking cessation. 100 tablet 3   paliperidone (INVEGA SUSTENNA) 156 MG/ML SUSY injection Inject 1 mL (156 mg total) into the muscle every 28 (twenty-eight) days. 1 mL 1   paliperidone (INVEGA SUSTENNA) 234 MG/1.5ML injection Inject 156 mg into the muscle once for 1 dose. 1 mL 0   No current facility-administered medications for this visit.    Musculoskeletal: Strength & Muscle Tone: within normal limits Gait & Station: normal Patient leans: N/A  Psychiatric Specialty Exam: Review of Systems  There were no vitals taken for this visit.There is no height or weight on file to calculate BMI.  General Appearance: Well Groomed  Eye Contact:  Good  Speech:  Clear and Coherent and Normal Rate  Volume:  Normal  Mood:  Euthymic  Affect:  Appropriate and Congruent  Thought Process:  Coherent, Goal Directed, and Linear  Orientation:  Full (Time, Place, and Person)  Thought Content:  WDL and Logical  Suicidal Thoughts:  No  Homicidal  Thoughts:  No  Memory:  Immediate;   Good Recent;   Good Remote;   Good  Judgement:  Fair  Insight:  Fair  Psychomotor Activity:  Normal  Concentration:  Concentration: Good and Attention Span: Good  Recall:  Good  Fund of Knowledge:Good  Language: Good  Akathisia:  No  Handed:  Right  AIMS (if indicated):  not done  Assets:  Communication Skills Desire for Improvement Financial Resources/Insurance Housing Leisure Time Physical Health Social Support Transportation  ADL's:  Intact  Cognition: WNL  Sleep:  Good   Screenings: PHQ2-9    [facility]  PHQ-2 Total Score 0  PHQ-9 Total Score 0      Flowsheet Row ED [facility] in [facility] ED from 11/03/20[facility] in [facility] ED from 07/18/2023 in [facility] Emergency Department at [facility]  C-SSRS RISK CATEGORY No Risk No Risk No Risk       Assessment and Plan: Patient reports that he is doing well on his current medication regimen.  Patient however continues to use methamphetamines and marijuana.  Provider informed patient that Invega LAI is not FDA approved for substance use issues.  Provider recommended that patient go to [facility] for recovery and detox.  He notes that he will consider it.  Provider informed patient that if he is reassessed once sober and presents with symptoms of schizophrenia or bipolar disorder his diagnoses can be adjusted and he can remain on Invega LAI.  Provider informed patient that if he is unable to maintain his sobriety Invega oral pills will have to be prescribed to help manage his mental health.  He endorsed understanding and agreed.  Provider spoke to Dr. Haze Rushing who recommended patient receive his maintenance dose of Invega 156 mg monthly.  Provider was agreeable to this and gave patient a sample today.  Ms. Loleta Chance was notified that the injection  will not be covered by the patient's insurance and told her in the future the patient may have to be on oral  Invega.  She endorsed understanding and agreed.  Patient given resources to [facility], [facility], and sons Annette Stable.  Provider also informed patient that if he needed more intensive care that he qualified for an ACT team.  Provider informed patient that she was unsure if the ACT teams would accept him and give him an LAI with his current history of substance use.  He endorsed understanding.  Provider gave patient resources to envisions of life, strategic intervention, and Publishing rights manager.  Patient will continue his Nicorette gum and hydroxyzine as prescribed.  He will follow-up with the Suboxone clinic for substance use treatment.  1. Methamphetamine use disorder, moderate, in early remission, dependence (HCC) (Primary)   2. Marijuana use   3. Substance induced mood disorder (HCC)  Continue- paliperidone (INVEGA SUSTENNA) 156 MG/ML SUSY injection; Inject 1 mL (156 mg total) into the muscle every 28 (twenty-eight) days.  Dispense: 1 mL; Refill: 1  4. Anxiety state  Continue hydrOXYzine (ATARAX) 25 MG tablet; Take 1 tablet (25 mg total) by mouth 3 (three) times daily as needed for anxiety.  Dispense: 90 tablet; Refill: 3  5. Tobacco dependence  Continue- nicotine polacrilex (NICORETTE) 2 MG gum; Take 1 each (2 mg total) by mouth as needed for smoking cessation.  Dispense: 100 tablet; Refill: 3   Collaboration of Care: Other provider involved in patient's care AEB PCP and counselor  Patient/Guardian was advised Release of Information must be obtained prior to any record release in order to collaborate their care with an outside provider. Patient/Guardian was advised if they have not already done so to contact the registration department to sign all necessary forms in order for [place] to release information regarding their care.   Consent: Patient/Guardian gives verbal consent for  treatment and assignment of benefits for services provided during this visit. Patient/Guardian expressed understanding and agreed to proceed.   Follow-up in 1 month for further assessment Follow-up with therapist for counseling  Shanna Cisco, NP 4/3/202512:57 PM

## 2023-11-17 NOTE — Progress Notes (Cosign Needed)
 Pt presents today for injection of invega sustenna 156 mg. Injection was administered in patients left deltoid with no complaints.    JNL, CMA

## 2023-12-15 ENCOUNTER — Encounter (HOSPITAL_COMMUNITY): Payer: Self-pay

## 2023-12-15 ENCOUNTER — Ambulatory Visit (INDEPENDENT_AMBULATORY_CARE_PROVIDER_SITE_OTHER)

## 2023-12-15 ENCOUNTER — Other Ambulatory Visit (HOSPITAL_COMMUNITY): Payer: Self-pay | Admitting: Psychiatry

## 2023-12-15 ENCOUNTER — Telehealth (HOSPITAL_COMMUNITY): Payer: Self-pay

## 2023-12-15 VITALS — BP 159/90 | HR 78 | Wt 183.0 lb

## 2023-12-15 DIAGNOSIS — F22 Delusional disorders: Secondary | ICD-10-CM | POA: Diagnosis not present

## 2023-12-15 DIAGNOSIS — F411 Generalized anxiety disorder: Secondary | ICD-10-CM | POA: Diagnosis not present

## 2023-12-15 DIAGNOSIS — R030 Elevated blood-pressure reading, without diagnosis of hypertension: Secondary | ICD-10-CM

## 2023-12-15 MED ORDER — PALIPERIDONE PALMITATE ER 156 MG/ML IM SUSY
156.0000 mg | PREFILLED_SYRINGE | Freq: Once | INTRAMUSCULAR | Status: AC
  Administered 2023-12-15: 156 mg via INTRAMUSCULAR

## 2023-12-15 NOTE — Progress Notes (Cosign Needed Addendum)
 Pt presents today for injection of Inevga sustenna 156 mg. This was administered in patients right deltoid with no complaints.    JNL, CMA

## 2023-12-15 NOTE — Telephone Encounter (Signed)
 Pt states that he wants to be referred to [facility] for PCP to monitor his BP. Provider states that she will refer him when time permits. Pt states he will call ahead of time to make APP.    JNL

## 2023-12-15 NOTE — Telephone Encounter (Signed)
 Patient referred to [facility] at [facility] for primary care.

## 2024-01-03 ENCOUNTER — Ambulatory Visit (HOSPITAL_COMMUNITY): Admitting: Licensed Clinical Social Worker

## 2024-01-12 ENCOUNTER — Encounter (HOSPITAL_COMMUNITY): Payer: Self-pay

## 2024-01-12 ENCOUNTER — Ambulatory Visit (INDEPENDENT_AMBULATORY_CARE_PROVIDER_SITE_OTHER)

## 2024-01-12 VITALS — BP 141/86 | HR 92 | Wt 185.6 lb

## 2024-01-12 DIAGNOSIS — G4700 Insomnia, unspecified: Secondary | ICD-10-CM

## 2024-01-12 DIAGNOSIS — F411 Generalized anxiety disorder: Secondary | ICD-10-CM

## 2024-01-12 DIAGNOSIS — F200 Paranoid schizophrenia: Secondary | ICD-10-CM

## 2024-01-12 MED ORDER — PALIPERIDONE PALMITATE ER 156 MG/ML IM SUSY
156.0000 mg | PREFILLED_SYRINGE | Freq: Once | INTRAMUSCULAR | Status: AC
  Administered 2024-01-12: 156 mg via INTRAMUSCULAR

## 2024-01-12 NOTE — Progress Notes (Signed)
 Pt presents today for injection of invega  sustenna 156 mg. Injection was administered in patients left deltoid with no complaints.

## 2024-02-09 ENCOUNTER — Ambulatory Visit (INDEPENDENT_AMBULATORY_CARE_PROVIDER_SITE_OTHER)

## 2024-02-09 ENCOUNTER — Telehealth (HOSPITAL_COMMUNITY): Payer: Self-pay

## 2024-02-09 VITALS — BP 160/96 | HR 74 | Temp 97.6°F | Ht 72.0 in | Wt 190.0 lb

## 2024-02-09 DIAGNOSIS — F22 Delusional disorders: Secondary | ICD-10-CM | POA: Diagnosis not present

## 2024-02-09 MED ORDER — PALIPERIDONE PALMITATE ER 156 MG/ML IM SUSY
156.0000 mg | PREFILLED_SYRINGE | Freq: Once | INTRAMUSCULAR | Status: AC
  Administered 2024-02-09: 156 mg via INTRAMUSCULAR

## 2024-02-09 NOTE — Progress Notes (Signed)
 Pt presents today for injection of Invega  sustenna 156 MG. This was Given in PT's Right-Deltiod PT tolerated injection well in right deltoid with no problems. Pt presents today to be well groomed and much more attentive at this visit than last. PT denies all AVH, SI, and HI. Pt states that he is doing good with current Medication: Invega .     BP was noted be high at this visit and last visit, along with his provider at past app, pt was referred to [facility] at [facility].    JNL,CMA    NDC: 59541-436-96 ONU:[id number] EXP: SEP-31-2025

## 2024-02-09 NOTE — Telephone Encounter (Signed)
 received fax that a prior auth was needed for the invega  sustenna 156mg /ml

## 2024-02-09 NOTE — Telephone Encounter (Signed)
 received fax from [employer] that prior auth for the invega  sustenna was approved from 02-09-24 to 02-08-25 approved J code [phone]

## 2024-02-09 NOTE — Telephone Encounter (Signed)
 Error encounter  JNL,CMA

## 2024-02-09 NOTE — Telephone Encounter (Signed)
 pharmacy was notified that prior auth was approved.

## 2024-03-08 ENCOUNTER — Ambulatory Visit (HOSPITAL_COMMUNITY)

## 2024-03-21 ENCOUNTER — Other Ambulatory Visit: Payer: Self-pay

## 2024-03-21 ENCOUNTER — Emergency Department (HOSPITAL_COMMUNITY): Admission: EM | Admit: 2024-03-21 | Discharge: 2024-03-21 | Disposition: A | Discharge status: Home / Self Care

## 2024-03-21 ENCOUNTER — Emergency Department (HOSPITAL_COMMUNITY)

## 2024-03-21 ENCOUNTER — Ambulatory Visit (INDEPENDENT_AMBULATORY_CARE_PROVIDER_SITE_OTHER): Admitting: Family

## 2024-03-21 ENCOUNTER — Encounter (HOSPITAL_COMMUNITY): Payer: Self-pay | Admitting: Family

## 2024-03-21 ENCOUNTER — Ambulatory Visit (HOSPITAL_COMMUNITY)

## 2024-03-21 ENCOUNTER — Encounter (HOSPITAL_COMMUNITY): Payer: Self-pay

## 2024-03-21 VITALS — BP 150/100 | HR 89 | Ht 72.0 in | Wt 190.0 lb

## 2024-03-21 DIAGNOSIS — R079 Chest pain, unspecified: Secondary | ICD-10-CM

## 2024-03-21 DIAGNOSIS — R0789 Other chest pain: Secondary | ICD-10-CM | POA: Diagnosis not present

## 2024-03-21 DIAGNOSIS — E871 Hypo-osmolality and hyponatremia: Secondary | ICD-10-CM | POA: Diagnosis not present

## 2024-03-21 DIAGNOSIS — F200 Paranoid schizophrenia: Secondary | ICD-10-CM

## 2024-03-21 DIAGNOSIS — R071 Chest pain on breathing: Secondary | ICD-10-CM | POA: Diagnosis not present

## 2024-03-21 LAB — CBC WITH DIFFERENTIAL/PLATELET
Abs Immature Granulocytes: 0.03 K/uL (ref 0.00–0.07)
Basophils Absolute: 0.1 K/uL (ref 0.0–0.1)
Basophils Relative: 1 %
Eosinophils Absolute: 0.2 K/uL (ref 0.0–0.5)
Eosinophils Relative: 3 %
HCT: 38.4 % — ABNORMAL LOW (ref 39.0–52.0)
Hemoglobin: 13.5 g/dL (ref 13.0–17.0)
Immature Granulocytes: 1 %
Lymphocytes Relative: 33 %
Lymphs Abs: 2 K/uL (ref 0.7–4.0)
MCH: 28.3 pg (ref 26.0–34.0)
MCHC: 35.2 g/dL (ref 30.0–36.0)
MCV: 80.5 fL (ref 80.0–100.0)
Monocytes Absolute: 0.4 K/uL (ref 0.1–1.0)
Monocytes Relative: 8 %
Neutro Abs: 3.2 K/uL (ref 1.7–7.7)
Neutrophils Relative %: 54 %
Platelets: 316 K/uL (ref 150–400)
RBC: 4.77 MIL/uL (ref 4.22–5.81)
RDW: 13.2 % (ref 11.5–15.5)
WBC: 5.8 K/uL (ref 4.0–10.5)
nRBC: 0 % (ref 0.0–0.2)

## 2024-03-21 LAB — BASIC METABOLIC PANEL WITH GFR
Anion gap: 9 (ref 5–15)
BUN: 14 mg/dL (ref 6–20)
CO2: 26 mmol/L (ref 22–32)
Calcium: 9 mg/dL (ref 8.9–10.3)
Chloride: 96 mmol/L — ABNORMAL LOW (ref 98–111)
Creatinine, Ser: 1.12 mg/dL (ref 0.61–1.24)
GFR, Estimated: >60 mL/min (ref >60)
Glucose, Bld: 375 mg/dL — ABNORMAL HIGH (ref 70–99)
Potassium: 3.9 mmol/L (ref 3.5–5.1)
Sodium: 131 mmol/L — ABNORMAL LOW (ref 135–145)

## 2024-03-21 LAB — I-STAT VENOUS BLOOD GAS, ED
Acid-Base Excess: 2 mmol/L (ref 0.0–2.0)
Bicarbonate: 26.8 mmol/L (ref 20.0–28.0)
Calcium, Ion: 1.17 mmol/L (ref 1.15–1.40)
HCT: 36 % — ABNORMAL LOW (ref 39.0–52.0)
Hemoglobin: 12.2 g/dL — ABNORMAL LOW (ref 13.0–17.0)
O2 Saturation: 76 %
Potassium: 4 mmol/L (ref 3.5–5.1)
Sodium: 133 mmol/L — ABNORMAL LOW (ref 135–145)
TCO2: 28 mmol/L (ref 22–32)
pCO2, Ven: 42.7 mmHg — ABNORMAL LOW (ref 44–60)
pH, Ven: 7.405 (ref 7.25–7.43)
pO2, Ven: 41 mmHg (ref 32–45)

## 2024-03-21 LAB — CBG MONITORING, ED: Glucose-Capillary: 233 mg/dL — ABNORMAL HIGH (ref 70–99)

## 2024-03-21 LAB — TROPONIN I (HIGH SENSITIVITY)
Troponin I (High Sensitivity): 5 ng/L (ref <18)
Troponin I (High Sensitivity): 6 ng/L (ref <18)

## 2024-03-21 LAB — D-DIMER, QUANTITATIVE: D-Dimer, Quant: <0.27 ug{FEU}/mL (ref 0.00–0.50)

## 2024-03-21 MED ORDER — SODIUM CHLORIDE 0.9 % IV BOLUS
1000.0000 mL | Freq: Once | INTRAVENOUS | Status: AC
  Administered 2024-03-21: 1000 mL via INTRAVENOUS

## 2024-03-21 NOTE — ED Triage Notes (Signed)
 C/O CP that radiates to right shoulder for 2 months. Hurts worse with deep breath. CP comes and goes. Denies SHOB/n/v. Axox4. VSS. Non compliant with diabetes medications.

## 2024-03-21 NOTE — Progress Notes (Cosign Needed Addendum)
 [facility] MD/PA/NP OP Progress Note  03/21/2024 3:20 PM Tanner [name]  MRN:  [id number]  Chief Complaint:  Chest discomfort/pain.   HPI:  Tanner [name] 58 year old Caucasian male presents for medication management injection.  Carries a diagnosis related to substance abuse mood disorder, psychosis, major depressive disorder and generalized anxiety disorder.  Initially on exam it was reported that blood pressure was 190/103 HR 88, manual palpated pressure 160/96.  He denied headache, nausea, vomiting or dizziness currently.  Does report that he is experiencing pain right side sharp.  Reports pain is intermittent. Stated  I had a bad episode 2 days ago that lasted for more than 2 hours.  He denied illicit drug use or substance abuse recently.   Will hold long-acting injectable medication until medically cleared by local emergency department/cardiology.  He denies a history of hypertension however states he thin he is supposed to take medications.   Contacted EMS for transportation.    Visit Diagnosis:    ICD-10-CM   1. Chest pain, unspecified type  R07.9       Past Psychiatric History:   Past Medical History:  Past Medical History:  Diagnosis Date   Cellulitis of right foot 05/17/2019   Diabetes mellitus without complication (HCC)    Drug abuse (HCC)    Essential hypertension 09/20/2017   Formatting of this note might be different from the original. Stable with current therapy; continue  Last Assessment & Plan:  Formatting of this note might be different from the original.   Hepatitis C antibody positive in blood 10/05/2017   Formatting of this note might be different from the original. 10/05/17: ab +, RNA neg on 2/12 labs. Likely past infection that is resolved. F/u prn.   History of vitamin D deficiency 09/20/2017   Formatting of this note might be different from the original. 09/20/17: per pt; labs 1 wk; supplement prn.   Opioid dependence, uncomplicated (HCC) 02/24/2018   Formatting of  this note might be different from the original. Self-medicating with buprenorphine  Formatting of this note might be different from the original. Self-medicating with buprenorphine    Periodontal disease 10/11/2017   Formatting of this note might be different from the original. 10/11/17: refer to dental   Schizophrenia (HCC)    Tobacco abuse    Type 2 diabetes mellitus with hyperglycemia (HCC) 01/17/2022    Past Surgical History:  Procedure Laterality Date   SKIN GRAFT      Family Psychiatric History:   Family History:  Family History  Problem Relation Age of Onset   Dementia Father    Diabetes Mellitus II Brother     Social History:  Social History   Socioeconomic History   Marital status: Legally Separated    Spouse name: Not on file   Number of children: Not on file   Years of education: Not on file   Highest education level: Not on file  Occupational History   Not on file  Tobacco Use   Smoking status: Every Day    Current packs/day: 1.00    Types: Cigarettes   Smokeless tobacco: Never  Vaping Use   Vaping status: Some Days   Substances: Nicotine   Substance and Sexual Activity   Alcohol use: Not Currently   Drug use: Yes    Types: IV, Methamphetamines    Comment: heroin   Sexual activity: Not on file  Other Topics Concern   Not on file  Social History Narrative   Not on file   Social Drivers of  Health   Financial Resource Strain: Not on File (04/04/2018)   Received from [employer] [facility]    Financial Resource Strain: 0  Recent Concern: Physicist, medical Strain - Medium Risk (02/24/2018)   Received from [facility]   Overall Financial Resource Strain (CARDIA)    Difficulty of Paying Living Expenses: Somewhat hard  Food Insecurity: No Food Insecurity (11/06/2023)   Hunger Vital Sign    Worried About Running Out of Food in the Last Year: Never true    Ran Out of Food in the Last Year: Never true  Transportation Needs: No Transportation  Needs (11/06/2023)   PRAPARE - Administrator, Civil Service (Medical): No    Lack of Transportation (Non-Medical): No  Physical Activity: Not on File (04/04/2018)   Received from [facility]   Physical Activity    Physical Activity: 0  Stress: Not on File (04/04/2018)   Received [facility]   Stress    Stress: 0  Social Connections: Unknown (12/26/2021)   [facility]   Social Network    Social Network: Not on file    Allergies:  Allergies  Allergen Reactions   Trazodone  And Nefazodone Other (See Comments)    Dry mouth    Metabolic Disorder Labs: Lab Results  Component Value Date   HGBA1C 8.7 (H) 11/07/2023   MPG 202.99 11/07/2023   MPG 217.34 09/17/2022   No results found for: PROLACTIN Lab Results  Component Value Date   CHOL 153 09/17/2022   TRIG 58 09/17/2022   HDL 71 09/17/2022   CHOLHDL 2.2 09/17/2022   VLDL 12 09/17/2022   LDLCALC 70 09/17/2022   Lab Results  Component Value Date   TSH 1.301 09/17/2022   TSH 1.057 05/15/2019    Therapeutic Level Labs: No results found for: LITHIUM No results found for: VALPROATE No results found for: CBMZ  Current Medications: Current Outpatient Medications  Medication Sig Dispense Refill   buprenorphine -naloxone  (SUBOXONE ) 8-2 mg SUBL SL tablet Place 1 tablet under the tongue 2 (two) times daily. 30 tablet 0   docusate sodium  (COLACE) 100 MG capsule Take 1 capsule (100 mg total) by mouth 2 (two) times daily. 10 capsule 0   hydrOXYzine  (ATARAX ) 25 MG tablet Take 1 tablet (25 mg total) by mouth 3 (three) times daily as needed for anxiety. 90 tablet 3   nicotine  polacrilex (NICORETTE ) 2 MG gum Take 1 each (2 mg total) by mouth as needed for smoking cessation. 100 tablet 3   paliperidone  (INVEGA  SUSTENNA) 156 MG/ML SUSY injection Inject 1 mL (156 mg total) into the muscle every 28 (twenty-eight) days. 1 mL 1   paliperidone  (INVEGA  SUSTENNA) 234 MG/1.5ML injection Inject 156 mg into the muscle  once for 1 dose. 1 mL 0   No current facility-administered medications for this visit.     Musculoskeletal: Strength & Muscle Tone: within normal limits Gait & Station: normal Patient leans: N/A  Psychiatric Specialty Exam: Review of Systems  There were no vitals taken for this visit.There is no height or weight on file to calculate BMI.  General Appearance: Casual  Eye Contact:  Good  Speech:  Clear and Coherent  Volume:  Normal  Mood:  Anxious and Depressed  Affect:  Congruent  Thought Process:  Coherent  Orientation:  Full (Time, Place, and Person)  Thought Content: Logical   Suicidal Thoughts:  No  Homicidal Thoughts:  No  Memory:  Immediate;   Good Recent;   Good  Judgement:  Good  Insight:  Good  Psychomotor Activity:  Normal  Concentration:  Concentration: Good  Recall:  Good  Fund of Knowledge: Good  Language: Good  Akathisia:  No  Handed:  Right  AIMS (if indicated): not done  Assets:  Communication Skills Desire for Improvement Resilience Social Support  ADL's:  Intact  Cognition: WNL  Sleep:  Fair   Screenings: GAD-7    Garment/textile technologist Visit from 11/17/2023 in [facility]  Total GAD-7 Score 3   PHQ2-9    Flowsheet Row Office Visit from 11/17/2023 in [facility] ED from 11/06/2023 in [facility]  PHQ-2 Total Score 1 0  PHQ-9 Total Score 3 0   Flowsheet Row Office Visit from 11/17/2023 in [facility] ED from 11/06/2023 in [facility] ED from 11/03/2023 in [facility] Emergency Department at [facility]  C-SSRS RISK CATEGORY No Risk No Risk No Risk     Assessment and Plan:  Contacted EMS to be transported to the local emergency department rule out cardiac symptoms.    Collaboration of Care: Collaboration of Care: Medication Management AEB Invega  156 mg due   Patient/Guardian was advised Release  of Information must be obtained prior to any record release in order to collaborate their care with an outside provider. Patient/Guardian was advised if they have not already done so to contact the registration department to sign all necessary forms in order for us  to release information regarding their care.   Consent: Patient/Guardian gives verbal consent for treatment and assignment of benefits for services provided during this visit. Patient/Guardian expressed understanding and agreed to proceed.    Staci LOISE Kerns, NP 03/21/2024, 3:20 PM

## 2024-03-21 NOTE — ED Provider Notes (Signed)
 [place] EMERGENCY DEPARTMENT AT [facility] Provider Note   CSN: [id number] Arrival date & time: 03/21/24  1530     Patient presents with: Chest Pain   Tanner [name] is a 58 y.o. male presents today for chest pain that radiates into his right shoulder x 2 months.  Patient reports the pain is worse when he takes a deep breath.  The pain is intermittent in nature.  Patient denies shortness of breath, nausea, vomiting, leg swelling, fever, chills, cough, congestion, any other complaints at this time.  Patient has a history of diabetes and is noncompliant with his medication.    Chest Pain      Prior to Admission medications   Medication Sig Start Date End Date Taking? Authorizing Provider  buprenorphine -naloxone  (SUBOXONE ) 8-2 mg SUBL SL tablet Place 1 tablet under the tongue 2 (two) times daily. 11/11/23   Victoria Ruts, MD  docusate sodium  (COLACE) 100 MG capsule Take 1 capsule (100 mg total) by mouth 2 (two) times daily. 11/11/23   Victoria Ruts, MD  hydrOXYzine  (ATARAX ) 25 MG tablet Take 1 tablet (25 mg total) by mouth 3 (three) times daily as needed for anxiety. 11/17/23   Harl Zane BRAVO, NP  nicotine  polacrilex (NICORETTE ) 2 MG gum Take 1 each (2 mg total) by mouth as needed for smoking cessation. 11/17/23   Harl Zane BRAVO, NP  paliperidone  (INVEGA  SUSTENNA) 156 MG/ML SUSY injection Inject 1 mL (156 mg total) into the muscle every 28 (twenty-eight) days. 11/17/23   Harl Zane BRAVO, NP  paliperidone  (INVEGA  SUSTENNA) 234 MG/1.5ML injection Inject 156 mg into the muscle once for 1 dose. 11/17/23 11/17/23  Victoria Ruts, MD    Allergies: Trazodone  and nefazodone    Review of Systems  Cardiovascular:  Positive for chest pain.    Updated Vital Signs BP (!) 147/89   Pulse 63   Temp (!) 97.4 F (36.3 C) (Oral)   Resp 14   Ht 6' (1.829 m)   Wt 86.2 kg   SpO2 100%   BMI 25.77 kg/m   Physical Exam  (all labs ordered are listed, but only abnormal  results are displayed) Labs Reviewed  BASIC METABOLIC PANEL WITH GFR - Abnormal; Notable for the following components:      Result Value   Sodium 131 (*)    Chloride 96 (*)    Glucose, Bld 375 (*)    All other components within normal limits  CBC WITH DIFFERENTIAL/PLATELET - Abnormal; Notable for the following components:   HCT 38.4 (*)    All other components within normal limits  I-STAT VENOUS BLOOD GAS, ED - Abnormal; Notable for the following components:   pCO2, Ven 42.7 (*)    Sodium 133 (*)    HCT 36.0 (*)    Hemoglobin 12.2 (*)    All other components within normal limits  CBG MONITORING, ED - Abnormal; Notable for the following components:   Glucose-Capillary 233 (*)    All other components within normal limits  D-DIMER, QUANTITATIVE  TROPONIN I (HIGH SENSITIVITY)  TROPONIN I (HIGH SENSITIVITY)    EKG: None  Radiology: DG Chest 2 View Result Date: 03/21/2024 CLINICAL DATA:  Central chest pain radiating to right shoulder for 2 months, pain with inspiration EXAM: CHEST - 2 VIEW COMPARISON:  02/03/2022 FINDINGS: Frontal and lateral views of the chest demonstrate an unremarkable cardiac silhouette. No acute airspace disease, effusion, or pneumothorax. No acute bony abnormalities. IMPRESSION: 1. No acute intrathoracic process. Electronically Signed  By: Ozell Daring M.D.   On: 03/21/2024 16:26     Procedures   Medications Ordered in the ED  sodium chloride  0.9 % bolus 1,000 mL (0 mLs Intravenous Stopped 03/21/24 1854)                                    Medical Decision Making Amount and/or Complexity of Data Reviewed Labs: ordered. Radiology: ordered.   This patient presents to the ED for concern of chest pain, this involves an extensive number of treatment options, and is a complaint that carries with it a high risk of complications and morbidity.  The differential diagnosis includes GERD, STEMI, NSTEMI, musculoskeletal pain, anxiety, endocarditis   Co  morbidities / Chronic conditions that complicate the patient evaluation  Diabetes, IV drug use, paraspinal abscess, schizophrenia   Additional history obtained:  Additional history obtained from EMR External records from outside source obtained and reviewed including behavioral health notes   Lab Tests:  I Ordered, and personally interpreted labs.  The pertinent results include: CBC WNL, hyperglycemia at 375, hyponatremia 131 corrected to 135 in the setting of hyperglycemia, troponin 5, 6, neg ddimer Glucose improved to 233 after liter NS   Imaging Studies ordered:  I ordered imaging studies including chest x-ray I independently visualized and interpreted imaging which showed no acute intrathoracic process I agree with the radiologist interpretation   Cardiac Monitoring: / EKG:  The patient was maintained on a cardiac monitor.  I personally viewed and interpreted the cardiac monitored which showed an underlying rhythm of: NSR borderline LAD   Problem List / ED Course / Critical interventions / Medication management I ordered medication including NS   I have reviewed the patients home medicines and have made adjustments as needed  Test / Admission - Considered:  Considered for admission or further workup however patient's vital signs, physical exam, labs and imaging are reassuring.  Patient given outpatient referral to cardiology for further evaluation of his symptoms.  Patient given return precautions.  I feel patient safe for discharge at this time.     Final diagnoses:  Chest pain, unspecified type    ED Discharge Orders     None          Francis Ileana SAILOR, PA-C 03/21/24 1858    Mannie Pac T, DO 03/21/24 2325

## 2024-03-21 NOTE — Progress Notes (Cosign Needed)
 Pt presents today for injection of Invega  sustenna. Pt BP was first read at 170/107.  Patient was referred to the Urgent care.   Patient states that he had heaviness on his chest and described this as feeling like a blockage. Pt showed symptoms of Heart attack or problems. Injection was not given.

## 2024-03-21 NOTE — Discharge Instructions (Addendum)
 Today you were evaluated for chest pain.  Please follow-up with cardiology for further evaluation workup.  You are also found to be hyperglycemic on the ED, please take your diabetes medications as prescribed.  Please return to the ED if you have worsening pain, dizziness, or uncontrollable vomiting.  Thank you for letting us  treat you today. After reviewing your labs and imaging, I feel you are safe to go home. Please follow up with your PCP in the next several days and provide them with your records from this visit. Return to the Emergency Room if pain becomes severe or symptoms worsen.

## 2024-03-29 ENCOUNTER — Encounter (HOSPITAL_COMMUNITY): Payer: Self-pay

## 2024-03-29 ENCOUNTER — Ambulatory Visit (HOSPITAL_COMMUNITY)

## 2024-03-29 NOTE — Progress Notes (Signed)
 Pt presents today for injection of invega  sustenna 156 mg. Injection was administered in patients Right  deltoid with no complaints.

## 2024-04-05 NOTE — Progress Notes (Deleted)
 Cardiology Clinic Note   Patient Name: Tanner [name] Date of Encounter: 04/05/2024  Primary Care Provider:  Patient, No Pcp Per Primary Cardiologist:  None  Patient Profile    Tanner [name] 58 year old male presents to the clinic today office chest pain and to establish care.  Past Medical History    Past Medical History:  Diagnosis Date   Cellulitis of right foot 05/17/2019   Diabetes mellitus without complication (HCC)    Drug abuse (HCC)    Essential hypertension 09/20/2017   Formatting of this note might be different from the original. Stable with current therapy; continue  Last Assessment & Plan:  Formatting of this note might be different from the original.   Hepatitis C antibody positive in blood 10/05/2017   Formatting of this note might be different from the original. 10/05/17: ab +, RNA neg on 2/12 labs. Likely past infection that is resolved. F/u prn.   History of vitamin D deficiency 09/20/2017   Formatting of this note might be different from the original. 09/20/17: per pt; labs 1 wk; supplement prn.   Opioid dependence, uncomplicated (HCC) 02/24/2018   Formatting of this note might be different from the original. Self-medicating with buprenorphine  Formatting of this note might be different from the original. Self-medicating with buprenorphine    Periodontal disease 10/11/2017   Formatting of this note might be different from the original. 10/11/17: refer to dental   Schizophrenia (HCC)    Tobacco abuse    Type 2 diabetes mellitus with hyperglycemia (HCC) 01/17/2022   Past Surgical History:  Procedure Laterality Date   SKIN GRAFT      Allergies  Allergies  Allergen Reactions   Trazodone  And Nefazodone Other (See Comments)    Dry mouth    History of Present Illness    Tanner [name] is a PMH of HTN, infective endocarditis of tricuspid valve, periodontal disease, type 2 diabetes, osteomyelitis, tobacco abuse, hypocalcemia, hyponatremia, intravenous drug use,  constipation, generalized anxiety disorder, chronic pain, and paranoia.  Echocardiogram 01/18/2022 showed an LVEF of 55%, normal diastolic parameters, trivial mitral valve regurgitation, mild-moderate mitral annular calcification, highly mobile filament of the tricuspid valve.  TEE was recommended.  He was initially seen in the emergency department on 03/21/2024.  He reported chest pain that radiated to his right shoulder x 2 months.  He noted that his pain was worse with deep breathing.  Pain was intermittent in nature.  He denied shortness of breath, nausea, vomiting, lower extremity swelling, cough, congestion, and fever.  His blood pressure was noted to be 147/89.  His pulse was 63.  His EKG showed normal sinus rhythm.  His sodium was 133.  His high-sensitivity troponins were 5 and 6.  His hemoglobin was 12.2 and his hematocrit was 36.0.  His D-dimer was negative.  He presents to the clinic today for evaluation and states***.  *** denies chest pain, shortness of breath, lower extremity edema, fatigue, palpitations, melena, hematuria, hemoptysis, diaphoresis, weakness, presyncope, syncope, orthopnea, and PND.  Chest pain-no chest pain today.  Reviewed emergency department visit.  Notes episodes of discomfort with***. Heart healthy low-sodium diet Order CBC Order TEE Coronary CTA  Tricuspid infective endocarditis-denies increased DOE or activity intolerance.  Echocardiogram 6/23 showed trivial mitral valve regurgitation, LVEF 55%, highly mobile filament of tricuspid valve Ordered TEE  Hyperlipidemia-LDL***. High-fiber diet Increase physical activity as tolerated Order fasting lipids and LFTs  Essential hypertension-BP today***. Heart healthy low-sodium diet Maintain blood pressure log  Disposition: Follow-up with Dr. Kate  or me in 2-3 months.   Home Medications    Prior to Admission medications   Medication Sig Start Date End Date Taking? Authorizing Provider   buprenorphine -naloxone  (SUBOXONE ) 8-2 mg SUBL SL tablet Place 1 tablet under the tongue 2 (two) times daily. 11/11/23   Victoria Ruts, MD  docusate sodium  (COLACE) 100 MG capsule Take 1 capsule (100 mg total) by mouth 2 (two) times daily. 11/11/23   Victoria Ruts, MD  hydrOXYzine  (ATARAX ) 25 MG tablet Take 1 tablet (25 mg total) by mouth 3 (three) times daily as needed for anxiety. 11/17/23   Harl Zane BRAVO, NP  nicotine  polacrilex (NICORETTE ) 2 MG gum Take 1 each (2 mg total) by mouth as needed for smoking cessation. 11/17/23   Harl Zane BRAVO, NP  paliperidone  (INVEGA  SUSTENNA) 156 MG/ML SUSY injection Inject 1 mL (156 mg total) into the muscle every 28 (twenty-eight) days. 11/17/23   Harl Zane BRAVO, NP  paliperidone  (INVEGA  SUSTENNA) 234 MG/1.5ML injection Inject 156 mg into the muscle once for 1 dose. 11/17/23 11/17/23  Victoria Ruts, MD    Family History    Family History  Problem Relation Age of Onset   Dementia Father    Diabetes Mellitus II Brother    He indicated that the status of his father is unknown. He indicated that the status of his brother is unknown.  Social History    Social History   Socioeconomic History   Marital status: Legally Separated    Spouse name: Not on file   Number of children: Not on file   Years of education: Not on file   Highest education level: Not on file  Occupational History   Not on file  Tobacco Use   Smoking status: Every Day    Current packs/day: 1.00    Types: Cigarettes   Smokeless tobacco: Never  Vaping Use   Vaping status: Some Days   Substances: Nicotine   Substance and Sexual Activity   Alcohol use: Not Currently   Drug use: Yes    Types: IV, Methamphetamines    Comment: heroin   Sexual activity: Not on file  Other Topics Concern   Not on file  Social History Narrative   Not on file   Social Drivers of Health   Financial Resource Strain: Not on File (04/04/2018)   Received from [facility] [facility]    Financial Resource Strain: 0  Recent Concern: Physicist, medical Strain - Medium Risk (02/24/2018)   Received from [facility]   Overall Financial Resource Strain (CARDIA)    Difficulty of Paying Living Expenses: Somewhat hard  Food Insecurity: No Food Insecurity (11/06/2023)   Hunger Vital Sign    Worried About Running Out of Food in the Last Year: Never true    Ran Out of Food in the Last Year: Never true  Transportation Needs: No Transportation Needs (11/06/2023)   PRAPARE - Administrator, Civil Service (Medical): No    Lack of Transportation (Non-Medical): No  Physical Activity: Not on File (04/04/2018)   Received from [facility]   Physical Activity    Physical Activity: 0  Stress: Not on File (04/04/2018)   [facility]   Stress    Stress: 0  Social Connections: Unknown (12/26/2021)   Received from [facility]   Social Network    Social Network: Not on file  Intimate Partner Violence: Not At Risk (11/06/2023)   Humiliation, Afraid, Rape, and Kick questionnaire    Fear  of Current or Ex-Partner: No    Emotionally Abused: No    Physically Abused: No    Sexually Abused: No     Review of Systems    General:  No chills, fever, night sweats or weight changes.  Cardiovascular:  No chest pain, dyspnea on exertion, edema, orthopnea, palpitations, paroxysmal nocturnal dyspnea. Dermatological: No rash, lesions/masses Respiratory: No cough, dyspnea Urologic: No hematuria, dysuria Abdominal:   No nausea, vomiting, diarrhea, bright red blood per rectum, melena, or hematemesis Neurologic:  No visual changes, wkns, changes in mental status. All other systems reviewed and are otherwise negative except as noted above.  Physical Exam    VS:  There were no vitals taken for this visit. , BMI There is no height or weight on file to calculate BMI. GEN: Well nourished, well developed, in no acute distress. HEENT: normal. Neck: Supple, no JVD, carotid  bruits, or masses. Cardiac: RRR, no murmurs, rubs, or gallops. No clubbing, cyanosis, edema.  Radials/DP/PT 2+ and equal bilaterally.  Respiratory:  Respirations regular and unlabored, clear to auscultation bilaterally. GI: Soft, nontender, nondistended, BS + x 4. MS: no deformity or atrophy. Skin: warm and dry, no rash. Neuro:  Strength and sensation are intact. Psych: Normal affect.  Accessory Clinical Findings    Recent Labs: 11/03/2023: ALT 33 03/21/2024: BUN 14; Creatinine, Ser 1.12; Hemoglobin 12.2; Platelets 316; Potassium 4.0; Sodium 133   Recent Lipid Panel    Component Value Date/Time   CHOL 153 09/17/2022 1853   TRIG 58 09/17/2022 1853   HDL 71 09/17/2022 1853   CHOLHDL 2.2 09/17/2022 1853   VLDL 12 09/17/2022 1853   LDLCALC 70 09/17/2022 1853    No BP recorded.  {Refresh Note OR Click here to enter BP  :1}***    ECG personally reviewed by me today- ***    Echocardiogram 01/18/2022  IMPRESSIONS     1. Left ventricular ejection fraction by 3D volume is 55 %. The left  ventricle has normal function. The left ventricle has no regional wall  motion abnormalities. Left ventricular diastolic parameters were normal.   2. Right ventricular systolic function is normal. The right ventricular  size is normal.   3. The mitral valve is normal in structure. Trivial mitral valve  regurgitation. No evidence of mitral stenosis.   4. There is a highly mobile filament on the tricupid valve that is not  well visualized, given the clinical picture with bacteremia recommend TEE  for clarification.   5. The aortic valve is normal in structure. Aortic valve regurgitation is  not visualized. No aortic stenosis is present.   6. The inferior vena cava is normal in size with greater than 50%  respiratory variability, suggesting right atrial pressure of 3 mmHg.   Conclusion(s)/Recommendation(s): Findings concerning for possible  vegetation, would recommend Transesophageal Echocardiogram  for  clarification.   FINDINGS   Left Ventricle: Left ventricular ejection fraction by 3D volume is 55 %.  The left ventricle has normal function. The left ventricle has no regional  wall motion abnormalities. The left ventricular internal cavity size was  normal in size. There is no left   ventricular hypertrophy. Left ventricular diastolic parameters were  normal.   Right Ventricle: The right ventricular size is normal. No increase in  right ventricular wall thickness. Right ventricular systolic function is  normal.   Left Atrium: Left atrial size was normal in size.   Right Atrium: Right atrial size was normal in size.   Pericardium: There is  no evidence of pericardial effusion.   Mitral Valve: The mitral valve is normal in structure. Mild to moderate  mitral annular calcification. Trivial mitral valve regurgitation. No  evidence of mitral valve stenosis.   Tricuspid Valve: There is a highly mobile filament on the tricupid valve  that is not well visualized, given the clinical picture with bacteremia  recommend TEE for clarification. Tricuspid valve regurgitation is trivial.  No evidence of tricuspid stenosis.   Aortic Valve: The aortic valve is normal in structure. Aortic valve  regurgitation is not visualized. No aortic stenosis is present.   Pulmonic Valve: The pulmonic valve was normal in structure. Pulmonic valve  regurgitation is not visualized. No evidence of pulmonic stenosis.   Aorta: The aortic root is normal in size and structure.   Venous: The inferior vena cava is normal in size with greater than 50%  respiratory variability, suggesting right atrial pressure of 3 mmHg.   IAS/Shunts: No atrial level shunt detected by color flow Doppler.       Assessment & Plan   1.  ***   Josefa HERO. Grae Cannata NP-C     04/05/2024, 6:26 AM [facility] [address] [place], [place] [address] Office [phone]      I  spent***minutes examining this patient, reviewing medications, and using patient centered shared decision making involving their cardiac care.   I spent  20 minutes reviewing past medical history,  medications, and prior cardiac tests.

## 2024-04-06 ENCOUNTER — Ambulatory Visit: Attending: General Practice | Admitting: General Practice

## 2024-04-26 ENCOUNTER — Ambulatory Visit (HOSPITAL_COMMUNITY): Payer: MEDICAID

## 2024-05-10 ENCOUNTER — Encounter: Payer: Self-pay | Admitting: General Practice

## 2024-05-23 ENCOUNTER — Telehealth (HOSPITAL_COMMUNITY): Payer: Self-pay

## 2024-05-23 NOTE — Telephone Encounter (Signed)
 Called patient to get appointment that he missed on 9/11 for injection rescheduled. Patient mother answered # ending [phone] said she is in a nursing home she just had surgery & she don't know how to get up with the patient. Called # ending [phone] & it could not be completed. Thanks

## 2024-05-28 ENCOUNTER — Other Ambulatory Visit (HOSPITAL_COMMUNITY): Payer: Self-pay | Admitting: Psychiatry

## 2024-05-28 DIAGNOSIS — F411 Generalized anxiety disorder: Secondary | ICD-10-CM

## 2024-06-05 ENCOUNTER — Ambulatory Visit (HOSPITAL_COMMUNITY)

## 2024-06-12 ENCOUNTER — Ambulatory Visit (HOSPITAL_COMMUNITY): Payer: MEDICAID

## 2024-06-13 ENCOUNTER — Encounter (HOSPITAL_COMMUNITY): Payer: Self-pay

## 2024-06-13 ENCOUNTER — Ambulatory Visit (INDEPENDENT_AMBULATORY_CARE_PROVIDER_SITE_OTHER): Payer: MEDICAID

## 2024-06-13 ENCOUNTER — Ambulatory Visit (INDEPENDENT_AMBULATORY_CARE_PROVIDER_SITE_OTHER): Payer: MEDICAID | Admitting: Family

## 2024-06-13 VITALS — BP 163/90 | HR 72 | Wt 190.6 lb

## 2024-06-13 DIAGNOSIS — F200 Paranoid schizophrenia: Secondary | ICD-10-CM

## 2024-06-13 DIAGNOSIS — F411 Generalized anxiety disorder: Secondary | ICD-10-CM | POA: Diagnosis not present

## 2024-06-13 NOTE — Progress Notes (Signed)
 [facility] MD/PA/NP OP Progress Note  06/15/2024 10:52 AM Tanner [name]  MRN:  [id number]  Chief Complaint: Long-acting injectable clinic   HPI:  Tanner [name] 58 year old Caucasian male presents for long-acting injectable medication.  Currently he is prescribed Invega  156 mg monthly which he reports he has been taking and tolerating well.  Carries a diagnosis related to major depressive disorder, generalized anxiety disorder, schizophrenia,  substance-induced mood disorder and psychosis.  He reports he has been tolerating medication fairly well.  Blood pressure improved since previous assessment.  Denied chest pain, headaches, dizziness or blurred vision.  Aims 0 no concerns related to akathisia or restlessness.  He reports a good appetite.  States he is resting well throughout the night.  Denies depression or depressive symptoms.  Tanner [name] denied suicidal or homicidal ideations.  Denied auditory visual hallucinations.  Denied delusional thought disorder paranoia or psychosis.  He denied that he is currently followed by therapy services.  States I been married for 36 years my wife is my paramedic.  States he is unemployed however does currently seeking employment.  Does report selling ' odds and ends.  Denied illicit drug use or substance abuse currently.  No other concerns noted at this visit.  Patient to follow-up 30 days for medication management.  Support encouragement reassurance was provided.   Visit Diagnosis:    ICD-10-CM   1. Schizophrenia, paranoid (HCC)  F20.0     2. Generalized anxiety disorder  F41.1       Past Psychiatric History:   Past Medical History:  Past Medical History:  Diagnosis Date   Cellulitis of right foot 05/17/2019   Diabetes mellitus without complication (HCC)    Drug abuse (HCC)    Essential hypertension 09/20/2017   Formatting of this note might be different from the original. Stable with current therapy; continue  Last Assessment & Plan:  Formatting of this  note might be different from the original.   Hepatitis C antibody positive in blood 10/05/2017   Formatting of this note might be different from the original. 10/05/17: ab +, RNA neg on 2/12 labs. Likely past infection that is resolved. F/u prn.   History of vitamin D deficiency 09/20/2017   Formatting of this note might be different from the original. 09/20/17: per pt; labs 1 wk; supplement prn.   Opioid dependence, uncomplicated (HCC) 02/24/2018   Formatting of this note might be different from the original. Self-medicating with buprenorphine  Formatting of this note might be different from the original. Self-medicating with buprenorphine    Periodontal disease 10/11/2017   Formatting of this note might be different from the original. 10/11/17: refer to dental   Schizophrenia (HCC)    Tobacco abuse    Type 2 diabetes mellitus with hyperglycemia (HCC) 01/17/2022    Past Surgical History:  Procedure Laterality Date   SKIN GRAFT      Family Psychiatric History:   Family History:  Family History  Problem Relation Age of Onset   Dementia Father    Diabetes Mellitus II Brother     Social History:  Social History   Socioeconomic History   Marital status: Legally Separated    Spouse name: Not on file   Number of children: Not on file   Years of education: Not on file   Highest education level: Not on file  Occupational History   Not on file  Tobacco Use   Smoking status: Every Day    Current packs/day: 1.00    Types: Cigarettes   Smokeless  tobacco: Never  Vaping Use   Vaping status: Some Days   Substances: Nicotine   Substance and Sexual Activity   Alcohol use: Not Currently   Drug use: Yes    Types: IV, Methamphetamines    Comment: heroin   Sexual activity: Not on file  Other Topics Concern   Not on file  Social History Narrative   Not on file   Social Drivers of Health   Financial Resource Strain: Not on File (04/04/2018)   Received from [employer] [facility]    Financial Resource Strain: 0  Recent Concern: Physicist, Medical Strain - Medium Risk (02/24/2018)   Received from [facility]   Overall Financial Resource Strain (CARDIA)    Difficulty of Paying Living Expenses: Somewhat hard  Food Insecurity: No Food Insecurity (11/06/2023)   Hunger Vital Sign    Worried About Running Out of Food in the Last Year: Never true    Ran Out of Food in the Last Year: Never true  Transportation Needs: No Transportation Needs (11/06/2023)   PRAPARE - Administrator, Civil Service (Medical): No    Lack of Transportation (Non-Medical): No  Physical Activity: Not on File (04/04/2018)   Received from [facility]   Physical Activity    Physical Activity: 0  Stress: Not on File (04/04/2018)   Received [facility]   Stress    Stress: 0  Social Connections: Unknown (12/26/2021)   Received from [facility]   Social Network    Social Network: Not on file    Allergies:  Allergies  Allergen Reactions   Trazodone  And Nefazodone Other (See Comments)    Dry mouth    Metabolic Disorder Labs: Lab Results  Component Value Date   HGBA1C 8.7 (H) 11/07/2023   MPG 202.99 11/07/2023   MPG 217.34 09/17/2022   No results found for: PROLACTIN Lab Results  Component Value Date   CHOL 153 09/17/2022   TRIG 58 09/17/2022   HDL 71 09/17/2022   CHOLHDL 2.2 09/17/2022   VLDL 12 09/17/2022   LDLCALC 70 09/17/2022   Lab Results  Component Value Date   TSH 1.301 09/17/2022   TSH 1.057 05/15/2019    Therapeutic Level Labs: No results found for: LITHIUM No results found for: VALPROATE No results found for: CBMZ  Current Medications: Current Outpatient Medications  Medication Sig Dispense Refill   buprenorphine -naloxone  (SUBOXONE ) 8-2 mg SUBL SL tablet Place 1 tablet under the tongue 2 (two) times daily. 30 tablet 0   docusate sodium  (COLACE) 100 MG capsule Take 1 capsule (100 mg total) by mouth 2 (two) times daily. 10 capsule 0    hydrOXYzine  (ATARAX ) 25 MG tablet TAKE 1 Tablet BY MOUTH THREE TIMES DAILY FOR ANXIETY 90 tablet 3   nicotine  polacrilex (NICORETTE ) 2 MG gum Take 1 each (2 mg total) by mouth as needed for smoking cessation. 100 tablet 3   paliperidone  (INVEGA  SUSTENNA) 156 MG/ML SUSY injection Inject 1 mL (156 mg total) into the muscle every 28 (twenty-eight) days. 1 mL 1   paliperidone  (INVEGA  SUSTENNA) 234 MG/1.5ML injection Inject 156 mg into the muscle once for 1 dose. 1 mL 0   No current facility-administered medications for this visit.     Musculoskeletal: Strength & Muscle Tone: within normal limits Gait & Station: normal Patient leans: N/A  Psychiatric Specialty Exam: Review of Systems  There were no vitals taken for this visit.There is no height or weight on file to calculate BMI.  General  Appearance: Casual  Eye Contact:  Good  Speech:  Clear and Coherent  Volume:  Normal  Mood:  Euthymic  Affect:  Congruent  Thought Process:  Coherent  Orientation:  Full (Time, Place, and Person)  Thought Content: Logical   Suicidal Thoughts:  No  Homicidal Thoughts:  No  Memory:  Immediate;   Good Recent;   Good  Judgement:  Good  Insight:  Good  Psychomotor Activity:  Normal  Concentration:  Concentration: Good  Recall:  Good  Fund of Knowledge: Good  Language: Good  Akathisia:  No  Handed:  Right  AIMS (if indicated): done  Assets:  Communication Skills Desire for Improvement  ADL's:  Intact  Cognition: WNL  Sleep:  Good   Screenings: GAD-7    Flowsheet Row Office Visit from 11/17/2023 in [facility]  Total GAD-7 Score 3   PHQ2-9    Flowsheet Row Office Visit from 11/17/2023 in [facility] ED from 11/06/2023 in [facility]  PHQ-2 Total Score 1 0  PHQ-9 Total Score 3 0   Flowsheet Row ED from 03/21/2024 in [facility] Emergency Department at [facility] Office Visit from 11/17/2023 in [facility] ED from 11/06/2023 in [facility]  C-SSRS RISK CATEGORY No Risk No Risk No Risk     Assessment and Plan:  Follow-up 28 days for medication management -Invega  156 mg -Keep all outpatient primary care follow-up appointments   Collaboration of Care: Collaboration of Care: Medication Management AEB Invega  156  Patient/Guardian was advised Release of Information must be obtained prior to any record release in order to collaborate their care with an outside provider. Patient/Guardian was advised if they have not already done so to contact the registration department to sign all necessary forms in order for us  to release information regarding their care.   Consent: Patient/Guardian gives verbal consent for treatment and assignment of benefits for services provided during this visit. Patient/Guardian expressed understanding and agreed to proceed.    Staci LOISE Kerns, NP 06/15/2024, 10:52 AM

## 2024-06-13 NOTE — Progress Notes (Signed)
 Pt presents today for injection of invega  sustenna 156 mg. Injection was administered in patients Left deltoid with no complaints.

## 2024-07-18 ENCOUNTER — Ambulatory Visit (HOSPITAL_COMMUNITY)

## 2024-07-19 ENCOUNTER — Encounter (HOSPITAL_COMMUNITY): Payer: Self-pay

## 2024-07-19 ENCOUNTER — Other Ambulatory Visit (HOSPITAL_COMMUNITY): Payer: Self-pay | Admitting: Psychiatry

## 2024-07-19 ENCOUNTER — Ambulatory Visit (INDEPENDENT_AMBULATORY_CARE_PROVIDER_SITE_OTHER): Payer: MEDICAID | Admitting: *Deleted

## 2024-07-19 VITALS — BP 142/80 | HR 68 | Ht 72.0 in | Wt 189.9 lb

## 2024-07-19 DIAGNOSIS — F200 Paranoid schizophrenia: Secondary | ICD-10-CM

## 2024-07-19 DIAGNOSIS — F1994 Other psychoactive substance use, unspecified with psychoactive substance-induced mood disorder: Secondary | ICD-10-CM

## 2024-07-19 MED ORDER — PALIPERIDONE PALMITATE ER 156 MG/ML IM SUSY
156.0000 mg | PREFILLED_SYRINGE | Freq: Once | INTRAMUSCULAR | Status: AC
  Administered 2024-07-19: 156 mg via INTRAMUSCULAR

## 2024-07-19 MED ORDER — INVEGA SUSTENNA 156 MG/ML IM SUSY: 156.0000 mg | PREFILLED_SYRINGE | INTRAMUSCULAR | 11 refills | Status: AC

## 2024-07-19 NOTE — Progress Notes (Signed)
 In as scheduled for his injection of invega  sustena 156 mg. He is pleasant and appropriate, offers no complaints. He got his shot today in his R DELTOID without issues, stated that was a good shot He is scheduled back in 28 days but with the holidays ahead he is scheduled back on 08/22/24. Dr Harl sent in new rx for inj with refills to [place] and to be sent here for future shots.

## 2024-08-22 ENCOUNTER — Ambulatory Visit (INDEPENDENT_AMBULATORY_CARE_PROVIDER_SITE_OTHER): Payer: MEDICAID

## 2024-08-22 ENCOUNTER — Encounter (HOSPITAL_COMMUNITY): Payer: Self-pay

## 2024-08-22 ENCOUNTER — Ambulatory Visit (HOSPITAL_COMMUNITY): Payer: MEDICAID | Admitting: Family

## 2024-08-22 VITALS — BP 153/83 | HR 79 | Temp 97.7°F | Ht 72.0 in | Wt 184.6 lb

## 2024-08-22 DIAGNOSIS — F1994 Other psychoactive substance use, unspecified with psychoactive substance-induced mood disorder: Secondary | ICD-10-CM | POA: Diagnosis not present

## 2024-08-22 DIAGNOSIS — F411 Generalized anxiety disorder: Secondary | ICD-10-CM

## 2024-08-22 DIAGNOSIS — F22 Delusional disorders: Secondary | ICD-10-CM

## 2024-08-22 MED ORDER — PALIPERIDONE PALMITATE ER 156 MG/ML IM SUSY
156.0000 mg | PREFILLED_SYRINGE | INTRAMUSCULAR | Status: AC
  Administered 2024-08-22: 156 mg via INTRAMUSCULAR

## 2024-08-22 NOTE — Progress Notes (Signed)
 [facility] MD/PA/NP OP Progress Note  08/22/2024 2:34 PM Tanner [name]  MRN:  [id number]  Chief Complaint: Long-acting injectable clinic  HPI: Tanner [name] 59-year-old male presents to long-acting injectable clinic for Invega  sustain 156 mgs. carries a diagnosis related to stimulant induced psychotic disorder, opiate dependency,  major depressive disorder and generalized anxiety disorder.  Reported he has been consistent with taking paliperidone  every 28 days without any medication side effects.  He reports a good appetite.  States he is resting well throughout the night.   Cambren denies restlessness or akathisia.  Aims 0.  Denying paranoia.   Patient does not appear to be responding to internal or external stimuli.  Of note elevated blood pressure at this visit.  Denies that he is prescribed any medication for hypertension.  He denies headache, nausea, vomiting, blurred vision or dizziness during this assessment.  Depression and anxiety 0 out of 10 with 10 being the worst.  Reports minimal's at this time cleaning the house and catching up with the New Year.   [name] is sitting; he is alert/oriented x 3; calm/cooperative; and mood congruent with affect.  Patient is speaking in a clear tone at moderate volume, and normal pace; with fleeting eye contact.  His  thought process is coherent and relevant; There is no indication that he is currently responding to internal/external stimuli or experiencing delusional thought content.    Patient denies suicidal/self-harm/homicidal ideation, psychosis, and paranoia.  Patient has remained calm throughout assessment and has answered questions appropriately.    Visit Diagnosis:    ICD-10-CM   1. Generalized anxiety disorder  F41.1     2. Substance induced mood disorder (HCC)  F19.94       Past Psychiatric History: Substance-induced mood disorder, paranoia, major depressive disorder and generalized anxiety disorder.  Currently prescribed Invega  sustain 156  q. every 28 days, hydroxyzine  25 mg p.o. 3 times daily.  Past Medical History:  Past Medical History:  Diagnosis Date   Cellulitis of right foot 05/17/2019   Diabetes mellitus without complication (HCC)    Drug abuse (HCC)    Essential hypertension 09/20/2017   Formatting of this note might be different from the original. Stable with current therapy; continue  Last Assessment & Plan:  Formatting of this note might be different from the original.   Hepatitis C antibody positive in blood 10/05/2017   Formatting of this note might be different from the original. 10/05/17: ab +, RNA neg on 2/12 labs. Likely past infection that is resolved. F/u prn.   History of vitamin D deficiency 09/20/2017   Formatting of this note might be different from the original. 09/20/17: per pt; labs 1 wk; supplement prn.   Opioid dependence, uncomplicated (HCC) 02/24/2018   Formatting of this note might be different from the original. Self-medicating with buprenorphine  Formatting of this note might be different from the original. Self-medicating with buprenorphine    Periodontal disease 10/11/2017   Formatting of this note might be different from the original. 10/11/17: refer to dental   Schizophrenia (HCC)    Tobacco abuse    Type 2 diabetes mellitus with hyperglycemia (HCC) 01/17/2022    Past Surgical History:  Procedure Laterality Date   SKIN GRAFT      Family Psychiatric History:   Family History:  Family History  Problem Relation Age of Onset   Dementia Father    Diabetes Mellitus II Brother     Social History:  Social History   Socioeconomic History   Marital status:  Legally Separated    Spouse name: Not on file   Number of children: Not on file   Years of education: Not on file   Highest education level: Not on file  Occupational History   Not on file  Tobacco Use   Smoking status: Every Day    Current packs/day: 1.00    Types: Cigarettes   Smokeless tobacco: Never  Vaping Use   Vaping  status: Some Days   Substances: Nicotine   Substance and Sexual Activity   Alcohol use: Not Currently   Drug use: Yes    Types: IV, Methamphetamines    Comment: heroin   Sexual activity: Not on file  Other Topics Concern   Not on file  Social History Narrative   Not on file   Social Drivers of Health   Tobacco Use: High Risk (07/19/2024)   Patient History    Smoking Tobacco Use: Every Day    Smokeless Tobacco Use: Never    Passive Exposure: Not on file  Financial Resource Strain: Not on file  Food Insecurity: No Food Insecurity (11/06/2023)   Hunger Vital Sign    Worried About Running Out of Food in the Last Year: Never true    Ran Out of Food in the Last Year: Never true  Transportation Needs: No Transportation Needs (11/06/2023)   PRAPARE - Administrator, Civil Service (Medical): No    Lack of Transportation (Non-Medical): No  Physical Activity: Not on file  Stress: Not on file  Social Connections: Unknown (12/26/2021)   Received from [facility]   Social Network    Social Network: Not on file  Depression (PHQ2-9): Low Risk (11/17/2023)   Depression (PHQ2-9)    PHQ-2 Score: 3  Recent Concern: Depression (PHQ2-9) - High Risk (11/09/2023)   Depression (PHQ2-9)    PHQ-2 Score: 17  Alcohol Screen: Not on file  Housing: Not on file  Utilities: Not At Risk (11/06/2023)   AHC Utilities    Threatened with loss of utilities: No  Health Literacy: Not on file    Allergies: Allergies[1]  Metabolic Disorder Labs: Lab Results  Component Value Date   HGBA1C 8.7 (H) 11/07/2023   MPG 202.99 11/07/2023   MPG 217.34 09/17/2022   No results found for: PROLACTIN Lab Results  Component Value Date   CHOL 153 09/17/2022   TRIG 58 09/17/2022   HDL 71 09/17/2022   CHOLHDL 2.2 09/17/2022   VLDL 12 09/17/2022   LDLCALC 70 09/17/2022   Lab Results  Component Value Date   TSH 1.301 09/17/2022   TSH 1.057 05/15/2019    Therapeutic Level Labs: No results found for:  LITHIUM No results found for: VALPROATE No results found for: CBMZ  Current Medications: Current Outpatient Medications  Medication Sig Dispense Refill   buprenorphine -naloxone  (SUBOXONE ) 8-2 mg SUBL SL tablet Place 1 tablet under the tongue 2 (two) times daily. 30 tablet 0   docusate sodium  (COLACE) 100 MG capsule Take 1 capsule (100 mg total) by mouth 2 (two) times daily. 10 capsule 0   hydrOXYzine  (ATARAX ) 25 MG tablet TAKE 1 Tablet BY MOUTH THREE TIMES DAILY FOR ANXIETY 90 tablet 3   nicotine  polacrilex (NICORETTE ) 2 MG gum Take 1 each (2 mg total) by mouth as needed for smoking cessation. 100 tablet 3   paliperidone  (INVEGA  SUSTENNA) 156 MG/ML SUSY injection Inject 1 mL (156 mg total) into the muscle every 28 (twenty-eight) days. 1 mL 11   No current facility-administered medications for this  visit.     Musculoskeletal: Strength & Muscle Tone: within normal limits Gait & Station: normal Patient leans: N/A  Psychiatric Specialty Exam: Review of Systems  There were no vitals taken for this visit.There is no height or weight on file to calculate BMI.  General Appearance: Casual  Eye Contact:  Good  Speech:  Clear and Coherent  Volume:  Normal  Mood:  Euthymic  Affect:  Congruent  Thought Process:  Coherent  Orientation:  Full (Time, Place, and Person)  Thought Content: Logical   Suicidal Thoughts:  No  Homicidal Thoughts:  No  Memory:  Immediate;   Fair Recent;   Fair  Judgement:  Good  Insight:  Good  Psychomotor Activity:  Normal  Concentration:  Concentration: Good  Recall:  Good  Fund of Knowledge: Good  Language: Good  Akathisia:  No  Handed:  Right  AIMS (if indicated): done  Assets:  Communication Skills Desire for Improvement  ADL's:  Intact  Cognition: WNL  Sleep:  Good   Screenings: GAD-7    Flowsheet Row Office Visit from 11/17/2023 in [facility]  Total GAD-7 Score 3   PHQ2-9    Flowsheet Row Office Visit  from 11/17/2023 in [facility] ED from 11/06/2023 in [facility]  PHQ-2 Total Score 1 0  PHQ-9 Total Score 3 0   Flowsheet Row ED from 03/21/2024 in [facility] Emergency Department at [facility] Office Visit from 11/17/2023 in [facility] ED from 11/06/2023 in [facility]  C-SSRS RISK CATEGORY No Risk No Risk No Risk     Assessment and Plan:  Follow-up 28 days for long-acting injectable Invega  Sustenna 156 mg Continue hydroxyzine  25 mg 3 times daily as needed Will refill nicotine  gum   Collaboration of Care: Collaboration of Care: Medication Management AEB Invega  Gustavus  Patient/Guardian was advised Release of Information must be obtained prior to any record release in order to collaborate their care with an outside provider. Patient/Guardian was advised if they have not already done so to contact the registration department to sign all necessary forms in order for us  to release information regarding their care.   Consent: Patient/Guardian gives verbal consent for treatment and assignment of benefits for services provided during this visit. Patient/Guardian expressed understanding and agreed to proceed.    Staci LOISE Kerns, NP 08/22/2024, 2:34 PM     [1]  Allergies Allergen Reactions   Trazodone  And Nefazodone Other (See Comments)    Dry mouth

## 2024-08-22 NOTE — Progress Notes (Signed)
 Pt presented for Injection of Invega  156 mg  in Left Deltoid. Pt tolerated injection well. Pt denies SI/HI/AVH. No additional concerns. Pt states medication is going well. Pt also states his family stated they can see that the medication is working well also. Pt will return in 28 days.   CJT- CMA Student  SN [phone] EXP Feb 2027 LOT [id number]

## 2024-09-19 ENCOUNTER — Ambulatory Visit (HOSPITAL_COMMUNITY): Payer: MEDICAID
